# Patient Record
Sex: Female | Born: 1965
Health system: Southern US, Community
[De-identification: ages and names within clinical notes are randomized; demographics above are authoritative.]

## PROBLEM LIST (undated history)

## (undated) DIAGNOSIS — K5732 Diverticulitis of large intestine without perforation or abscess without bleeding: Secondary | ICD-10-CM

## (undated) DIAGNOSIS — M359 Systemic involvement of connective tissue, unspecified: Secondary | ICD-10-CM

## (undated) DIAGNOSIS — M1990 Unspecified osteoarthritis, unspecified site: Secondary | ICD-10-CM

## (undated) DIAGNOSIS — C801 Malignant (primary) neoplasm, unspecified: Secondary | ICD-10-CM

## (undated) DIAGNOSIS — K5720 Diverticulitis of large intestine with perforation and abscess without bleeding: Secondary | ICD-10-CM

## (undated) DIAGNOSIS — E785 Hyperlipidemia, unspecified: Secondary | ICD-10-CM

## (undated) DIAGNOSIS — I639 Cerebral infarction, unspecified: Secondary | ICD-10-CM

## (undated) DIAGNOSIS — M069 Rheumatoid arthritis, unspecified: Secondary | ICD-10-CM

## (undated) DIAGNOSIS — Z9889 Other specified postprocedural states: Secondary | ICD-10-CM

## (undated) DIAGNOSIS — E876 Hypokalemia: Secondary | ICD-10-CM

## (undated) DIAGNOSIS — I10 Essential (primary) hypertension: Secondary | ICD-10-CM

## (undated) DIAGNOSIS — F419 Anxiety disorder, unspecified: Secondary | ICD-10-CM

## (undated) DIAGNOSIS — T4145XA Adverse effect of unspecified anesthetic, initial encounter: Secondary | ICD-10-CM

## (undated) DIAGNOSIS — F329 Major depressive disorder, single episode, unspecified: Secondary | ICD-10-CM

## (undated) DIAGNOSIS — T884XXA Failed or difficult intubation, initial encounter: Secondary | ICD-10-CM

## (undated) DIAGNOSIS — E669 Obesity, unspecified: Secondary | ICD-10-CM

## (undated) DIAGNOSIS — T8859XA Other complications of anesthesia, initial encounter: Secondary | ICD-10-CM

## (undated) DIAGNOSIS — E0590 Thyrotoxicosis, unspecified without thyrotoxic crisis or storm: Secondary | ICD-10-CM

## (undated) DIAGNOSIS — F32A Depression, unspecified: Secondary | ICD-10-CM

## (undated) DIAGNOSIS — I509 Heart failure, unspecified: Secondary | ICD-10-CM

## (undated) DIAGNOSIS — K9189 Other postprocedural complications and disorders of digestive system: Secondary | ICD-10-CM

## (undated) DIAGNOSIS — Z87442 Personal history of urinary calculi: Secondary | ICD-10-CM

## (undated) HISTORY — DX: Failed or difficult intubation, initial encounter: T88.4XXA

## (undated) HISTORY — DX: Obesity, unspecified: E66.9

## (undated) HISTORY — DX: Hyperlipidemia, unspecified: E78.5

## (undated) HISTORY — PX: ABDOMINAL HYSTERECTOMY: SHX81

## (undated) HISTORY — PX: CARDIAC CATHETERIZATION: SHX172

## (undated) HISTORY — PX: TONSILLECTOMY: SUR1361

## (undated) HISTORY — PX: CHOLECYSTECTOMY: SHX55

## (undated) HISTORY — PX: TOTAL THYROIDECTOMY: SHX2547

## (undated) HISTORY — DX: Thyrotoxicosis, unspecified without thyrotoxic crisis or storm: E05.90

## (undated) HISTORY — DX: Diverticulitis of large intestine with perforation and abscess without bleeding: K57.20

## (undated) HISTORY — DX: Hypokalemia: E87.6

## (undated) HISTORY — DX: Other specified postprocedural states: Z98.890

## (undated) HISTORY — DX: Rheumatoid arthritis, unspecified: M06.9

## (undated) HISTORY — PX: CARPAL TUNNEL RELEASE: SHX101

## (undated) HISTORY — DX: Other postprocedural complications and disorders of digestive system: K91.89

---

## 1992-09-06 DIAGNOSIS — Z87442 Personal history of urinary calculi: Secondary | ICD-10-CM

## 1992-09-06 HISTORY — DX: Personal history of urinary calculi: Z87.442

## 2006-05-15 ENCOUNTER — Emergency Department: Payer: Self-pay | Admitting: Emergency Medicine

## 2006-05-20 ENCOUNTER — Ambulatory Visit: Payer: Self-pay | Admitting: Nurse Practitioner

## 2008-10-01 ENCOUNTER — Ambulatory Visit: Payer: Self-pay | Admitting: Orthopedic Surgery

## 2008-10-08 ENCOUNTER — Ambulatory Visit: Payer: Self-pay | Admitting: Orthopedic Surgery

## 2009-06-25 ENCOUNTER — Inpatient Hospital Stay: Payer: Self-pay | Admitting: Internal Medicine

## 2010-10-29 ENCOUNTER — Ambulatory Visit: Payer: Self-pay | Admitting: Anesthesiology

## 2010-11-03 ENCOUNTER — Ambulatory Visit: Payer: Self-pay | Admitting: Orthopedic Surgery

## 2011-09-07 DIAGNOSIS — I639 Cerebral infarction, unspecified: Secondary | ICD-10-CM

## 2011-09-07 HISTORY — DX: Cerebral infarction, unspecified: I63.9

## 2012-07-23 ENCOUNTER — Inpatient Hospital Stay (HOSPITAL_COMMUNITY): Payer: BC Managed Care – PPO

## 2012-07-23 ENCOUNTER — Encounter (HOSPITAL_COMMUNITY): Payer: Self-pay | Admitting: *Deleted

## 2012-07-23 ENCOUNTER — Emergency Department: Payer: Self-pay | Admitting: Unknown Physician Specialty

## 2012-07-23 ENCOUNTER — Inpatient Hospital Stay (HOSPITAL_COMMUNITY)
Admission: AD | Admit: 2012-07-23 | Discharge: 2012-07-26 | DRG: 014 | Disposition: A | Discharge status: Home / Self Care | Payer: BC Managed Care – PPO | Source: Other Acute Inpatient Hospital | Attending: Neurology | Admitting: Neurology

## 2012-07-23 ENCOUNTER — Inpatient Hospital Stay: Admission: AD | Admit: 2012-07-23 | Payer: Self-pay | Source: Other Acute Inpatient Hospital | Admitting: Neurology

## 2012-07-23 DIAGNOSIS — E785 Hyperlipidemia, unspecified: Secondary | ICD-10-CM | POA: Diagnosis present

## 2012-07-23 DIAGNOSIS — Z79899 Other long term (current) drug therapy: Secondary | ICD-10-CM

## 2012-07-23 DIAGNOSIS — R29898 Other symptoms and signs involving the musculoskeletal system: Secondary | ICD-10-CM | POA: Diagnosis present

## 2012-07-23 DIAGNOSIS — E669 Obesity, unspecified: Secondary | ICD-10-CM | POA: Diagnosis present

## 2012-07-23 DIAGNOSIS — Z6831 Body mass index (BMI) 31.0-31.9, adult: Secondary | ICD-10-CM

## 2012-07-23 DIAGNOSIS — Z7982 Long term (current) use of aspirin: Secondary | ICD-10-CM

## 2012-07-23 DIAGNOSIS — I639 Cerebral infarction, unspecified: Secondary | ICD-10-CM | POA: Diagnosis present

## 2012-07-23 DIAGNOSIS — Z9282 Status post administration of tPA (rtPA) in a different facility within the last 24 hours prior to admission to current facility: Secondary | ICD-10-CM

## 2012-07-23 DIAGNOSIS — Z23 Encounter for immunization: Secondary | ICD-10-CM

## 2012-07-23 DIAGNOSIS — M069 Rheumatoid arthritis, unspecified: Secondary | ICD-10-CM | POA: Diagnosis present

## 2012-07-23 DIAGNOSIS — I10 Essential (primary) hypertension: Secondary | ICD-10-CM | POA: Diagnosis present

## 2012-07-23 DIAGNOSIS — I6350 Cerebral infarction due to unspecified occlusion or stenosis of unspecified cerebral artery: Principal | ICD-10-CM

## 2012-07-23 DIAGNOSIS — F17200 Nicotine dependence, unspecified, uncomplicated: Secondary | ICD-10-CM | POA: Diagnosis present

## 2012-07-23 HISTORY — DX: Essential (primary) hypertension: I10

## 2012-07-23 HISTORY — DX: Cerebral infarction, unspecified: I63.9

## 2012-07-23 HISTORY — DX: Unspecified osteoarthritis, unspecified site: M19.90

## 2012-07-23 LAB — CK TOTAL AND CKMB (NOT AT ARMC): CK, Total: 89 U/L (ref 21–215)

## 2012-07-23 LAB — CBC
HGB: 14.6 g/dL (ref 12.0–16.0)
MCH: 29.8 pg (ref 26.0–34.0)
MCHC: 35.6 g/dL (ref 32.0–36.0)
RBC: 4.89 10*6/uL (ref 3.80–5.20)
RDW: 13.2 % (ref 11.5–14.5)
WBC: 7.1 10*3/uL (ref 3.6–11.0)

## 2012-07-23 LAB — TROPONIN I: Troponin-I: <0.02 ng/mL

## 2012-07-23 LAB — COMPREHENSIVE METABOLIC PANEL
Albumin: 4.1 g/dL (ref 3.4–5.0)
Anion Gap: 7 (ref 7–16)
BUN: 13 mg/dL (ref 7–18)
Bilirubin,Total: 0.6 mg/dL (ref 0.2–1.0)
Chloride: 106 mmol/L (ref 98–107)
Co2: 28 mmol/L (ref 21–32)
EGFR (African American): >60
Osmolality: 281 (ref 275–301)
Potassium: 3.3 mmol/L — ABNORMAL LOW (ref 3.5–5.1)
SGOT(AST): 26 U/L (ref 15–37)
Total Protein: 7.8 g/dL (ref 6.4–8.2)

## 2012-07-23 LAB — SEDIMENTATION RATE: Erythrocyte Sed Rate: 15 mm/h

## 2012-07-23 LAB — APTT: Activated PTT: 33.4 secs (ref 23.6–35.9)

## 2012-07-23 LAB — PROTIME-INR
INR: 0.9
Prothrombin Time: 12.6 s

## 2012-07-23 LAB — MAGNESIUM: Magnesium: 1.9 mg/dL

## 2012-07-23 LAB — MRSA PCR SCREENING: MRSA by PCR: NEGATIVE

## 2012-07-23 MED ORDER — ACETAMINOPHEN 325 MG PO TABS
650.0000 mg | ORAL_TABLET | ORAL | Status: DC | PRN
  Administered 2012-07-23 – 2012-07-24 (×4): 650 mg via ORAL
  Filled 2012-07-23 (×4): qty 2

## 2012-07-23 MED ORDER — ACETAMINOPHEN 650 MG RE SUPP: 650.0000 mg | RECTAL | Status: DC | PRN

## 2012-07-23 MED ORDER — LABETALOL HCL 5 MG/ML IV SOLN: 10.0000 mg | INTRAVENOUS | Status: DC | PRN

## 2012-07-23 MED ORDER — PANTOPRAZOLE SODIUM 40 MG IV SOLR
40.0000 mg | Freq: Every day | INTRAVENOUS | Status: DC
  Administered 2012-07-23: 40 mg via INTRAVENOUS
  Filled 2012-07-23 (×2): qty 40

## 2012-07-23 MED ORDER — ONDANSETRON HCL 4 MG/2ML IJ SOLN
4.0000 mg | Freq: Four times a day (QID) | INTRAMUSCULAR | Status: DC | PRN
  Administered 2012-07-23: 4 mg via INTRAVENOUS
  Filled 2012-07-23: qty 2

## 2012-07-23 MED ORDER — SENNOSIDES-DOCUSATE SODIUM 8.6-50 MG PO TABS
1.0000 | ORAL_TABLET | Freq: Every evening | ORAL | Status: DC | PRN
  Filled 2012-07-23: qty 1

## 2012-07-23 MED ORDER — PNEUMOCOCCAL VAC POLYVALENT 25 MCG/0.5ML IJ INJ
0.5000 mL | INJECTION | INTRAMUSCULAR | Status: AC
  Administered 2012-07-24: 0.5 mL via INTRAMUSCULAR
  Filled 2012-07-23: qty 0.5

## 2012-07-23 MED ORDER — SODIUM CHLORIDE 0.9 % IV SOLN
INTRAVENOUS | Status: DC
  Administered 2012-07-23: 14:00:00 via INTRAVENOUS
  Administered 2012-07-24: 75 mL/h via INTRAVENOUS

## 2012-07-23 NOTE — H&P (Signed)
Admission H&P    Chief Complaint: Acute onset of left lower extremity weakness and numbness.  HPI: Rebecca Obrien is an 46 y.o. female history rheumatoid arthritis and hypertension who developed weakness and numbness of her left lower extremity at 10:30 this morning. She went to the emergency room at [facility] for evaluation. She had no weakness of left upper extremity nor facial weakness. There no changes in speech. CT scan of her head showed no signs of acute stroke nor hemorrhage. There is no previous history of stroke nor TIA. Patient has not been on antiplatelet therapy. She was given intravenous TPA, full dose and subsequently transferred to [facility] for further management. NIH stroke score prior to TPA was 3. NIH stroke score on arrival at [facility] was 1.  LSN: 10:30 AM today tPA Given: Yes MRankin: 1  Past Medical History  Diagnosis Date  . Arthritis     RA  . Hypertension     Past Surgical History  Procedure Date  . Abdominal hysterectomy     No family history on file. Social History:  reports that she has been smoking Cigarettes.  She has a 23 pack-year smoking history. She does not have any smokeless tobacco history on file. She reports that she does not drink alcohol or use illicit drugs.  Allergies: Allergies not on file  Medications: Prior to admission: Remicade Prinzide 10-12.5 one per day.  ROS: 14 point review of systems was unremarkable except for presenting illness.  Physical Examination: Blood pressure 148/76, pulse 81, temperature 98 F (36.7 C), temperature source Oral, resp. rate 18, height 5\' 5"  (1.651 m), weight 83.6 kg (184 lb 4.9 oz), SpO2 99.00%.  HEENT-  Normocephalic, no lesions, without obvious abnormality.  Normal external eye and conjunctiva.  Normal TM's bilaterally.  Normal auditory canals and external ears. Normal external nose, mucus membranes and septum.  Normal pharynx. Neck supple with no  masses, nodes, nodules or enlargement. Cardiovascular - regular rate and rhythm, S1, S2 normal, no murmur, click, rub or gallop Lungs - chest clear, no wheezing, rales, normal symmetric air entry, Heart exam - S1, S2 normal, no murmur, no gallop, rate regular Abdomen - soft, non-tender; bowel sounds normal; no masses,  no organomegaly Extremities - no joint deformities, effusion, or inflammation, no edema and no skin discoloration  Neurologic Examination: Mental Status: Alert, oriented x3.  Speech fluent without evidence of aphasia. Able to follow commands without difficulty. Cranial Nerves: II-Visual fields were normal. III/IV/VI-Pupils were equal and reacted. Extraocular movements were full and conjugate.    V/VII-no facial numbness and no facial weakness. VIII-normal. X-normal speech and symmetrical palatal movement. XII-midline tongue extension Motor: Slight drift of left lower extremity against gravity; normal strength distally of left lower extremity; motor exam otherwise completely normal including left upper extremity. Sensory: Normal throughout. Deep Tendon Reflexes: 2+ and symmetric. Plantars: Mute bilaterally Cerebellar: Normal finger-to-nose testing. Carotid auscultation: Normal    No results found for this or any previous visit (from the past 48 hour(s)). No results found.  Assessment: 46 y.o. female presenting post TPA administration with history indicative of possible right anterior cerebral artery territory TIA or stroke.  Stroke Risk Factors - hypertension  Plan: 1. HgbA1c, fasting lipid panel 2. MRI, MRA  of the brain without contrast 3. PT consult 4. Echocardiogram 5. Carotid dopplers 6. Prophylactic therapy-Antiplatelet med: Aspirin 325 mg per day if it CT scan of the head shows no signs of intracranial hemorrhage 24 hours  post TPA administration. 7. Risk factor modification 7. Telemetry monitoring 8. Workup to rule out hypercoagulable state  C.R. Roseanne Reno,  MD [facility] [phone] 07/23/2012, 1:44 PM

## 2012-07-24 ENCOUNTER — Encounter (HOSPITAL_COMMUNITY): Payer: Self-pay | Admitting: *Deleted

## 2012-07-24 ENCOUNTER — Inpatient Hospital Stay (HOSPITAL_COMMUNITY): Payer: BC Managed Care – PPO

## 2012-07-24 DIAGNOSIS — I6350 Cerebral infarction due to unspecified occlusion or stenosis of unspecified cerebral artery: Secondary | ICD-10-CM

## 2012-07-24 LAB — LIPID PANEL
LDL Cholesterol: 132 mg/dL — ABNORMAL HIGH (ref 0–99)
Total CHOL/HDL Ratio: 5.6 RATIO
VLDL: 24 mg/dL (ref 0–40)

## 2012-07-24 MED ORDER — ASPIRIN 325 MG PO TABS
325.0000 mg | ORAL_TABLET | Freq: Every day | ORAL | Status: DC
  Administered 2012-07-24 – 2012-07-25 (×2): 325 mg via ORAL
  Filled 2012-07-24 (×3): qty 1

## 2012-07-24 MED ORDER — ATORVASTATIN CALCIUM 10 MG PO TABS
10.0000 mg | ORAL_TABLET | Freq: Every day | ORAL | Status: DC
  Administered 2012-07-24 – 2012-07-25 (×2): 10 mg via ORAL
  Filled 2012-07-24 (×3): qty 1

## 2012-07-24 MED ORDER — LISINOPRIL 10 MG PO TABS
10.0000 mg | ORAL_TABLET | Freq: Every day | ORAL | Status: DC
  Administered 2012-07-25: 10 mg via ORAL
  Filled 2012-07-24 (×2): qty 1

## 2012-07-24 MED ORDER — PANTOPRAZOLE SODIUM 40 MG PO TBEC
40.0000 mg | DELAYED_RELEASE_TABLET | Freq: Every day | ORAL | Status: DC
  Filled 2012-07-24: qty 1

## 2012-07-24 MED ORDER — LISINOPRIL-HYDROCHLOROTHIAZIDE 10-12.5 MG PO TABS: 1.0000 | ORAL_TABLET | Freq: Every day | ORAL | Status: DC

## 2012-07-24 MED ORDER — LORAZEPAM 2 MG/ML IJ SOLN
1.0000 mg | INTRAMUSCULAR | Status: AC
  Administered 2012-07-24: 1 mg via INTRAVENOUS
  Filled 2012-07-24: qty 1

## 2012-07-24 MED ORDER — HYDROCHLOROTHIAZIDE 12.5 MG PO CAPS
12.5000 mg | ORAL_CAPSULE | Freq: Every day | ORAL | Status: DC
  Administered 2012-07-25: 12.5 mg via ORAL
  Filled 2012-07-24 (×2): qty 1

## 2012-07-24 NOTE — Progress Notes (Signed)
Echocardiogram 2D Echocardiogram has been performed.  Chantay Whitelock 07/24/2012, 10:39 AM

## 2012-07-24 NOTE — Progress Notes (Addendum)
Pt tx from 3106 via w/c.  Pt placed in bed and safety plan discussed. Pt also placed on Tele # [id number]. Will cont to monitor. Deliah Goody 07/24/2012 2115

## 2012-07-24 NOTE — Progress Notes (Signed)
Utilization review completed.  P.J. Shigeru Lampert,RN,BSN Case Manager [phone]  

## 2012-07-24 NOTE — Progress Notes (Signed)
Stroke Team Progress Note  HISTORY Rebecca Obrien is an 46 y.o. female history rheumatoid arthritis and hypertension who developed weakness and numbness of her left lower extremity at 10:30 this morning. She went to the emergency room at [facility] for evaluation. She had no weakness of left upper extremity nor facial weakness. There no changes in speech. CT scan of her head showed no signs of acute stroke nor hemorrhage. There is no previous history of stroke nor TIA. Patient has not been on antiplatelet therapy. She was given intravenous TPA, full dose and subsequently transferred to [facility] for further management. NIH stroke score prior to TPA was 3. NIH stroke score on arrival at [facility] was 1. She was admitted to the neuro ICU for further evaluation and treatment.  SUBJECTIVE STABLE. Slight improvement since yesterday.  OBJECTIVE Most recent Vital Signs: Filed Vitals:   07/24/12 0500 07/24/12 0600 07/24/12 0700 07/24/12 0800  BP: 109/44 119/52 117/78 123/66  Pulse: 57 68 69 56  Temp:    97.7 F (36.5 C)  TempSrc:    Oral  Resp: 17 15 16 16   Height:      Weight:      SpO2: 95% 97% 98% 96%   CBG (last 3)  No results found for this basename: GLUCAP:3 in the last 72 hours  IV Fluid Intake:     . sodium chloride 75 mL/hr at 07/24/12 0800    MEDICATIONS    . pantoprazole (PROTONIX) IV  40 mg Intravenous QHS  . pneumococcal 23 valent vaccine  0.5 mL Intramuscular Tomorrow-1000   PRN:  acetaminophen, acetaminophen, labetalol, ondansetron (ZOFRAN) IV, senna-docusate  Diet:  Cardiac thin liquids Activity:  Bedrest DVT Prophylaxis:  SCDs   CLINICALLY SIGNIFICANT STUDIES Basic Metabolic Panel: No results found for this basename: NA:2,K:2,CL:2,CO2:2,GLUCOSE:2,BUN:2,CREATININE:2,CALCIUM:2,MG:2,PHOS:2 in the last 168 hours Liver Function Tests: No results found for this basename: AST:2,ALT:2,ALKPHOS:2,BILITOT:2,PROT:2,ALBUMIN:2 in the last  168 hours CBC: No results found for this basename: WBC:2,NEUTROABS:2,HGB:2,HCT:2,MCV:2,PLT:2 in the last 168 hours Coagulation: No results found for this basename: LABPROT:4,INR:4 in the last 168 hours Cardiac Enzymes: No results found for this basename: CKTOTAL:3,CKMB:3,CKMBINDEX:3,TROPONINI:3 in the last 168 hours Urinalysis: No results found for this basename: COLORURINE:2,APPERANCEUR:2,LABSPEC:2,PHURINE:2,GLUCOSEU:2,HGBUR:2,BILIRUBINUR:2,KETONESUR:2,PROTEINUR:2,UROBILINOGEN:2,NITRITE:2,LEUKOCYTESUR:2 in the last 168 hours Lipid Panel    Component Value Date/Time   CHOL 190 07/24/2012 0435   TRIG 122 07/24/2012 0435   HDL 34* 07/24/2012 0435   CHOLHDL 5.6 07/24/2012 0435   VLDL 24 07/24/2012 0435   LDLCALC 132* 07/24/2012 0435   HgbA1C  No results found for this basename: HGBA1C   Urine Drug Screen:   No results found for this basename: labopia, cocainscrnur, labbenz, amphetmu, thcu, labbarb    Alcohol Level: No results found for this basename: ETH:2 in the last 168 hours   CT of the brain  07/23/2012 at [place]  MRI of the brain    MRA of the brain    2D Echocardiogram    Carotid Doppler    CXR  07/24/2012  No acute abnormalities.   EKG  normal sinus rhythm at [place]  Therapy Recommendations PT - ; OT - ; ST -   Physical Exam   GENERAL EXAM: Patient is in no distress  CARDIOVASCULAR: Regular rate and rhythm, no murmurs, no carotid bruits  NEUROLOGIC: MENTAL STATUS: awake, alert, language fluent, comprehension intact, naming intact CRANIAL NERVE: pupils equal and reactive to light, visual fields full to confrontation, extraocular muscles intact, no nystagmus, facial sensation and strength symmetric,  uvula midline, shoulder shrug symmetric, tongue midline. MOTOR: normal bulk and tone, full strength in the BUE, RLE; LLE 4/5. SENSORY: normal and symmetric to light touch COORDINATION: finger-nose-finger, fine finger movements normal REFLEXES: deep tendon  reflexes present and symmetric GAIT/STATION: BED REST.  ASSESSMENT Ms. Keisha Amer is a 46 y.o. female presenting with weakness and numbness of the left lower extremity . Status post IV t-PA 07/23/2012 at 1155 at [facility], transferred to [facility] for management. Suspect right brain stroke. 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  Work up underway. On no antiplatelet prior to admission. Now on no antiplatelet as within 24h of tpa for secondary stroke prevention. Patient with resultant LLE hemiparesis.   Rheumatoid arthritis, on remicade Hypertension Hyperlipidemia, LDL 132, not on statin PTA, goal LDL < 100 Cigarette smoker Obesity class 1 Body mass index is 31.22 kg/(m^2).  Hospital day # 1  TREATMENT/PLAN  MRI, MRA at 24h. Ativan for sedation as pt claustrophobic   Add aspirin 325 mg orally every day for secondary stroke prevention if imaging at 24h negative for hemorrhage  If imaging stable, will get OOB, therapy evals  D/c foley at 24h  Add statin  Stop smoking  Resume home BP medication later today/tomorrow  Transfer to the floor if imaging stable  Annie Main, MSN, RN, ANVP-BC, [facility] Pager: [phone] 07/24/2012 8:49 AM  [facility] - Stroke Team Joycelyn Schmid, MD 07/24/2012, 7:19 PM   Please refer to [email or web address] for on-call Stroke MD

## 2012-07-24 NOTE — Progress Notes (Signed)
PT Cancellation Note  Patient Details Name: Gigi Onstad MRN: [id number] DOB: [birth year]   Cancelled Treatment:    Reason Eval/Treat Not Completed: Patient not medically ready (Pt currently on strict bedrest.)  Please update activity orders when appropriate.  Thanks!!   Zalayah Pizzuto 07/24/2012, 8:01 AM Jake Shark, PT DPT [phone]

## 2012-07-25 LAB — SEDIMENTATION RATE: Sed Rate: 18 mm/hr (ref 0–22)

## 2012-07-25 MED ORDER — SODIUM CHLORIDE 0.9 % IV SOLN
INTRAVENOUS | Status: DC
  Administered 2012-07-26 (×2): via INTRAVENOUS

## 2012-07-25 NOTE — Progress Notes (Signed)
Stroke Team Progress Note  HISTORY Rebecca Obrien is an 46 y.o. female history rheumatoid arthritis and hypertension who developed weakness and numbness of her left lower extremity at 10:30 this morning. She went to the emergency room at [facility] for evaluation. She had no weakness of left upper extremity nor facial weakness. There no changes in speech. CT scan of her head showed no signs of acute stroke nor hemorrhage. There is no previous history of stroke nor TIA. Patient has not been on antiplatelet therapy. She was given intravenous TPA, full dose and subsequently transferred to [facility] for further management. NIH stroke score prior to TPA was 3. NIH stroke score on arrival at  Hospital was 1. She was admitted to the neuro ICU for further evaluation and treatment.  SUBJECTIVE No complaints. LLE still slightly weak.  OBJECTIVE Most recent Vital Signs: Filed Vitals:   07/25/12 0110 07/25/12 0310 07/25/12 0510 07/25/12 0900  BP: 116/62 116/70 126/66 135/84  Pulse: 65 70 73 66  Temp: 97.8 F (36.6 C) 97.9 F (36.6 C) 97.8 F (36.6 C) 98.2 F (36.8 C)  TempSrc:    Oral  Resp: 18 18 18 18  Height:      Weight:      SpO2: 96% 97% 96% 96%   IV Fluid Intake:      . [DISCONTINUED] sodium chloride 75 mL/hr at 07/24/12 0800    MEDICATIONS     . aspirin  325 mg Oral Daily  . atorvastatin  10 mg Oral q1800  . lisinopril  10 mg Oral Daily   And  . hydrochlorothiazide  12.5 mg Oral Daily  . [COMPLETED] LORazepam  1 mg Intravenous On Call  . [DISCONTINUED] lisinopril-hydrochlorothiazide  1 tablet Oral Daily  . [DISCONTINUED] pantoprazole  40 mg Oral QHS  . [DISCONTINUED] pantoprazole (PROTONIX) IV  40 mg Intravenous QHS   PRN:  acetaminophen, acetaminophen, labetalol, senna-docusate, [DISCONTINUED] ondansetron (ZOFRAN) IV  Diet:  Cardiac thin liquids Activity:  Bedrest DVT Prophylaxis:  SCDs   CLINICALLY SIGNIFICANT STUDIES Basic  Metabolic Panel: No results found for this basename: NA:2,K:2,CL:2,CO2:2,GLUCOSE:2,BUN:2,CREATININE:2,CALCIUM:2,MG:2,PHOS:2 in the last 168 hours Liver Function Tests: No results found for this basename: AST:2,ALT:2,ALKPHOS:2,BILITOT:2,PROT:2,ALBUMIN:2 in the last 168 hours CBC: No results found for this basename: WBC:2,NEUTROABS:2,HGB:2,HCT:2,MCV:2,PLT:2 in the last 168 hours Coagulation: No results found for this basename: LABPROT:4,INR:4 in the last 168 hours Cardiac Enzymes: No results found for this basename: CKTOTAL:3,CKMB:3,CKMBINDEX:3,TROPONINI:3 in the last 168 hours Urinalysis: No results found for this basename: COLORURINE:2,APPERANCEUR:2,LABSPEC:2,PHURINE:2,GLUCOSEU:2,HGBUR:2,BILIRUBINUR:2,KETONESUR:2,PROTEINUR:2,UROBILINOGEN:2,NITRITE:2,LEUKOCYTESUR:2 in the last 168 hours Lipid Panel    Component Value Date/Time   CHOL 190 07/24/2012 0435   TRIG 122 07/24/2012 0435   HDL 34* 07/24/2012 0435   CHOLHDL 5.6 07/24/2012 0435   VLDL 24 07/24/2012 0435   LDLCALC 132* 07/24/2012 0435   HgbA1C  Lab Results  Component Value Date   HGBA1C 5.6 07/24/2012   Urine Drug Screen:   No results found for this basename: labopia,  cocainscrnur,  labbenz,  amphetmu,  thcu,  labbarb    Alcohol Level: No results found for this basename: ETH:2 in the last 168 hours   CT of the brain  07/23/2012 at [place]  MRI of the brain  IMPRESSION:  Sub-centimeter acute infarction affecting the surface of a medial  right frontoparietal junction region gyrus. Otherwise normal  examination.   MRA of the brain  IMPRESSION:  No major vessel occlusion or correctable proximal stenosis.  Suspicion of distal vessel atherosclerotic change. However,   there  is a good bit of motion degradation on the MR angiography portion  of the exam. The lack of small vessel ischemic changes in the  brain on the parenchymal imaging raises the question if this is a  true finding or simply related to motion  degradation.   2D Echocardiogram  EF 55-60% with no source of embolus.  Carotid Doppler  No evidence of hemodynamically significant internal carotid artery stenosis. Vertebral artery flow is antegrade.   CXR  07/24/2012  No acute abnormalities.   EKG  normal sinus rhythm at [place]  Therapy Recommendations PT - outpatient; OT - ; ST - none  Physical Exam   GENERAL EXAM: Patient is in no distress  CARDIOVASCULAR: Regular rate and rhythm, no murmurs, no carotid bruits  NEUROLOGIC: MENTAL STATUS: awake, alert, language fluent, comprehension intact, naming intact CRANIAL NERVE: pupils equal and reactive to light, visual fields full to confrontation, extraocular muscles intact, no nystagmus, facial sensation and strength symmetric, uvula midline, shoulder shrug symmetric, tongue midline. MOTOR: normal bulk and tone, full strength in the BUE, RLE; LLE 4/5. SENSORY: normal and symmetric to light touch COORDINATION: finger-nose-finger, fine finger movements normal REFLEXES: deep tendon reflexes present and symmetric GAIT/STATION: BED REST.  ASSESSMENT Ms. Rebecca Obrien is a 46 y.o. female presenting with weakness and numbness of the left lower extremity . Status post IV t-PA 07/23/2012 at 1155 at [place], transferred to [facility] for management. 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  Work up underway. On no antiplatelet prior to admission. Now on aspirin 325 mg orally every day for secondary stroke prevention. Patient with resultant LLE hemiparesis that is improving   Rheumatoid arthritis, on remicade Hypertension Hyperlipidemia, LDL 132, not on statin PTA, goal LDL < 100 Cigarette smoker Obesity class 1 Body mass index is 31.22 kg/(m^2).  Hospital day # 2  TREATMENT/PLAN  Continue aspirin 325 mg orally every day for secondary stroke prevention   Stop smoking TEE to look for embolic  source. Arranged with [facility] for Wednesday. Will need to be NPO after midnight. If positive for PFO (patent foramen ovale), check bilateral lower extremity venous dopplers to rule out DVT as possible source of stroke.  Labs to look for hypercoagulable source:  Hypercoagulable panel (except Factor V Leiden & Beta-2-glycoprotein, which are both associated with venous not arterial infarcts), Vasculitic labs (C3, C4, CH50, ESR, ANA) HIV & RPR  SHARON BIBY, MSN, RN, ANVP-BC, ANP-BC, GNP-BC [facility] Pager: [phone] 07/25/2012 10:54 AM  [facility] - Stroke Team Newman Waren, MD 07/25/2012, 10:54 AM   Please refer to [email or web address] for on-call Stroke MD   

## 2012-07-25 NOTE — Evaluation (Signed)
Speech Language Pathology Evaluation Patient Details Name: Rebecca Obrien MRN: [id number] DOB: [birth year] Today's Date: 07/25/2012 Time: 4098-1191 SLP Time Calculation (min): 12 min  Problem List:  Patient Active Problem List  Diagnosis  . CVA (cerebral infarction)  . Hypertension   Past Medical History:  Past Medical History  Diagnosis Date  . Arthritis     RA  . Hypertension    Past Surgical History:  Past Surgical History  Procedure Date  . Abdominal hysterectomy    HPI:  Rebecca Obrien is a 46 y.o. female history rheumatoid arthritis and hypertension who developed weakness and numbness of her left lower extremity. CT scan of her head showed no signs of acute stroke nor hemorrhage. There is no previous history of stroke nor TIA. MRI revealed sub-centimeter acute infarction affecting the surface of a medial right frontoparietal junction region gyrus.   Assessment / Plan / Recommendation Clinical Impression  Cognitive-linguistic evaluation completed and revealed [facility] cognitive-linguistic skills and reports being at baseline level of function.  Therefore no follow up skilled SLP serives are warranted at this time.      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[employer], political [employer])   Cognition  Overall Cognitive Status: Appears within functional limits for tasks assessed Orientation Level: Oriented X4 Safety/Judgment: Appears intact    Comprehension  Auditory Comprehension Overall Auditory Comprehension: Appears within functional limits for tasks assessed Visual Recognition/Discrimination Discrimination: Within Function Limits Reading Comprehension Reading Status: Within funtional limits    Expression Expression Primary Mode of  Expression: Verbal Verbal Expression Overall Verbal Expression: Appears within functional limits for tasks assessed Written Expression Written Expression: Not tested   Oral / Motor Oral Motor/Sensory Function Overall Oral Motor/Sensory Function: Appears within functional limits for tasks assessed Motor Speech Overall Motor Speech: Appears within functional limits for tasks assessed    Charlane Ferretti., CCC-SLP [phone]  Rebecca Obrien 07/25/2012, 12:18 PM

## 2012-07-25 NOTE — Evaluation (Signed)
Physical Therapy Evaluation Patient Details Name: Rebecca Obrien MRN: [id number] DOB: [birth year] Today's Date: 07/25/2012 Time: 1550-1620 PT Time Calculation (min): 30 min  PT Assessment / Plan / Recommendation Clinical Impression  Rebecca Obrien is a 46 y.o. female history rheumatoid arthritis and hypertension who developed weakness and numbness of her left lower extremity. CT scan of her head showed no signs of acute stroke nor hemorrhage so full TPA administered at [facility]. There is no previous history of stroke nor TIA. MRI revealed sub-centimeter acute infarction affecting the surface of a medial right frontoparietal junction region gyrus.  Presents to PT today with LLE weakness affecting her independence with gait especially with higher challenging activities affecting her prior level of function and ability to resume daily activities and return to work.    PT Assessment  Patient needs continued PT services    Follow Up Recommendations  Outpatient PT    Does the patient have the potential to tolerate intense rehabilitation      Barriers to Discharge Decreased caregiver support      Equipment Recommendations  None recommended by PT    Recommendations for Other Services     Frequency Min 4X/week    Precautions / Restrictions Precautions Precautions: None Restrictions Weight Bearing Restrictions: No   Pertinent Vitals/Pain Denies pain      Mobility  Bed Mobility Bed Mobility: Supine to Sit;Sit to Supine Supine to Sit: 6: Modified independent (Device/Increase time);HOB elevated (30 degrees) Sit to Supine: 6: Modified independent (Device/Increase time);HOB elevated (30 degrees) Transfers Transfers: Sit to Stand;Stand to Sit Sit to Stand: 7: Independent;5: Supervision Stand to Sit: 7: Independent;5: Supervision Details for Transfer Assistance: initial supervision with verbal cues for centered 50/50 weight bearing (pt tending to favor LLE), pt able to perform sit<>stand  x5 with good control and midline posture after education Ambulation/Gait Ambulation/Gait Assistance: 4: Min guard;5: Supervision Ambulation Distance (Feet): 600 Feet (rest after 300 ft) Assistive device: None Ambulation/Gait Assistance Details: verbal cues for midline posture  and equal weight bearing for improved fluidity of gait as pt tends to favor LLE with antalgic type gait Gait Pattern: Decreased weight shift to left;Decreased stance time - left General Gait Details: as pt fatigues her LLE tends to drag slightly  Stairs: No Modified Rankin (Stroke Patients Only) Pre-Morbid Rankin Score: No symptoms Modified Rankin: Slight disability    Shoulder Instructions     Exercises Other Exercises Other Exercises: single limb stance x5 bilaterally with 10 second holds, supervision for safety Other Exercises: AP weight shift with focus on weight bearing through LLE as RLE stepped forward and backward x10, no upper extremity support but supervision for safety   PT Diagnosis: Abnormality of gait;Difficulty walking;Generalized weakness;Hemiplegia non-dominant side  PT Problem List: Decreased strength;Decreased activity tolerance;Decreased balance PT Treatment Interventions: Gait training;Stair training;Functional mobility training;Therapeutic activities;Therapeutic exercise;Balance training;Neuromuscular re-education;Patient/family education   PT Goals Acute Rehab PT Goals PT Goal Formulation: With patient Time For Goal Achievement: 08/01/12 Potential to Achieve Goals: Good Pt will Ambulate: Independently;with gait velocity >(comment) ft/second (2.62 ft/sec (community ambulator)) PT Goal: Ambulate - Progress: Goal set today Pt will Go Up / Down Stairs: 3-5 stairs;Independently PT Goal: Up/Down Stairs - Progress: Goal set today Pt will Perform Home Exercise Program: Independently PT Goal: Perform Home Exercise Program - Progress: Goal set today  Visit Information  Last PT Received On:  07/25/12 Assistance Needed: +1    Subjective Data  Subjective: My leg just feels heavier.  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, broadcasting/film/video and bus driver Communication Communication: No difficulties    Cognition  Overall Cognitive Status: Appears within functional limits for tasks assessed/performed Arousal/Alertness: Awake/alert Orientation Level: Appears intact for tasks assessed Behavior During Session: [facility] for tasks performed    Extremity/Trunk Assessment Right Upper Extremity Assessment RUE ROM/Strength/Tone: [facility] for tasks assessed Left Upper Extremity Assessment LUE ROM/Strength/Tone: WFL for tasks assessed Right Lower Extremity Assessment RLE ROM/Strength/Tone: [facility] for tasks assessed Left Lower Extremity Assessment LLE ROM/Strength/Tone: Deficits LLE ROM/Strength/Tone Deficits: grossly 4+/5 throughout with slightly weaker knee flexion noted during MMT LLE Sensation: WFL - Light Touch LLE Coordination: Deficits LLE Coordination Deficits: very slight decrease in control with extremity unweighted (gait and heel shin test slightly off); pt with c/o leg trying to pull   Balance Balance Balance Assessed: Yes Static Standing Balance Static Standing - Balance Support: No upper extremity supported Static Standing - Level of Assistance: 7: Independent Static Standing - Comment/# of Minutes: pt stood with eyes closed and no increase in sway Single Leg Stance - Right Leg: 10  Single Leg Stance - Left Leg: 10  High Level Balance High Level Balance Comments:  Difficulty with speed changes   End of Session PT - End of Session Equipment Utilized During Treatment: Gait belt Activity Tolerance: Patient tolerated treatment well Patient left: in bed;with call bell/phone within reach;with family/visitor present Nurse Communication: Mobility status  GP     [facility] HELEN 07/25/2012, 4:47 PM

## 2012-07-25 NOTE — Progress Notes (Signed)
VASCULAR LAB PRELIMINARY  PRELIMINARY  PRELIMINARY  PRELIMINARY  Carotid duplex completed.    Preliminary report:  Bilateral:  No evidence of hemodynamically significant internal carotid artery stenosis.   Vertebral artery flow is antegrade.     Rebecca Obrien, RVS 07/25/2012, 2:22 PM

## 2012-07-25 NOTE — Progress Notes (Signed)
OT Cancellation Note  Patient Details Name: Rebecca Obrien MRN: [id number] DOB: 07-05-1966   Cancelled Treatment:     Pt gone for EEG. Will recheck on pt next day. Thanks,  Einar Crow D 07/25/2012, 2:16 PM

## 2012-07-26 ENCOUNTER — Encounter (HOSPITAL_COMMUNITY): Payer: Self-pay | Admitting: *Deleted

## 2012-07-26 ENCOUNTER — Encounter (HOSPITAL_COMMUNITY)
Admission: AD | Disposition: A | Discharge status: Home / Self Care | Payer: Self-pay | Source: Other Acute Inpatient Hospital | Attending: Neurology

## 2012-07-26 DIAGNOSIS — I6789 Other cerebrovascular disease: Secondary | ICD-10-CM

## 2012-07-26 HISTORY — PX: TEE WITHOUT CARDIOVERSION: SHX5443

## 2012-07-26 LAB — C4 COMPLEMENT: Complement C4, Body Fluid: 19 mg/dL (ref 10–40)

## 2012-07-26 LAB — LUPUS ANTICOAGULANT PANEL: PTT Lupus Anticoagulant: 36.2 secs (ref 28.0–43.0)

## 2012-07-26 LAB — RPR: RPR Ser Ql: NONREACTIVE

## 2012-07-26 LAB — HOMOCYSTEINE: Homocysteine: 11.2 umol/L (ref 4.0–15.4)

## 2012-07-26 LAB — CARDIOLIPIN ANTIBODIES, IGG, IGM, IGA
Anticardiolipin IgG: 4 GPL U/mL — ABNORMAL LOW (ref <23)
Anticardiolipin IgM: 2 MPL U/mL — ABNORMAL LOW (ref <11)

## 2012-07-26 LAB — PROTEIN C ACTIVITY: Protein C Activity: 194 % — ABNORMAL HIGH (ref 75–133)

## 2012-07-26 LAB — PROTEIN S ACTIVITY: Protein S Activity: 114 % (ref 69–129)

## 2012-07-26 LAB — ANA: Anti Nuclear Antibody(ANA): NEGATIVE

## 2012-07-26 SURGERY — ECHOCARDIOGRAM, TRANSESOPHAGEAL
Anesthesia: Moderate Sedation

## 2012-07-26 MED ORDER — BUTAMBEN-TETRACAINE-BENZOCAINE 2-2-14 % EX AERO
INHALATION_SPRAY | CUTANEOUS | Status: DC | PRN
  Administered 2012-07-26: 2 via TOPICAL

## 2012-07-26 MED ORDER — ATORVASTATIN CALCIUM 10 MG PO TABS: 10.0000 mg | ORAL_TABLET | Freq: Every day | ORAL | Status: DC

## 2012-07-26 MED ORDER — ASPIRIN 325 MG PO TABS: 325.0000 mg | ORAL_TABLET | Freq: Every day | ORAL | Status: DC

## 2012-07-26 MED ORDER — FENTANYL CITRATE 0.05 MG/ML IJ SOLN
INTRAMUSCULAR | Status: DC | PRN
  Administered 2012-07-26 (×4): 25 ug via INTRAVENOUS

## 2012-07-26 MED ORDER — FENTANYL CITRATE 0.05 MG/ML IJ SOLN
INTRAMUSCULAR | Status: AC
  Filled 2012-07-26: qty 2

## 2012-07-26 MED ORDER — MIDAZOLAM HCL 5 MG/ML IJ SOLN
INTRAMUSCULAR | Status: AC
  Filled 2012-07-26: qty 2

## 2012-07-26 MED ORDER — MIDAZOLAM HCL 10 MG/2ML IJ SOLN
INTRAMUSCULAR | Status: DC | PRN
  Administered 2012-07-26 (×5): 2 mg via INTRAVENOUS

## 2012-07-26 NOTE — Progress Notes (Signed)
PT Progress Note    07/26/12 1114  PT Visit Information  Last PT Received On 07/26/12  Assistance Needed +1  PT Time Calculation  PT Start Time 0949  PT Stop Time 1002  PT Time Calculation (min) 13 min  Subjective Data  Subjective "I feel fine"  Patient Stated Goal home  Precautions  Precautions None  Restrictions  Weight Bearing Restrictions No  Cognition  Overall Cognitive Status Appears within functional limits for tasks assessed/performed  Arousal/Alertness Awake/alert  Orientation Level Appears intact for tasks assessed  Behavior During Session [facility] for tasks performed  Bed Mobility  Bed Mobility Not assessed  Details for Bed Mobility Assistance Pt. presentd sitting at EOB.  Transfers  Transfers Sit to Stand;Stand to Sit  Sit to Stand 7: Independent;From bed;With upper extremity assist  Stand to Sit 7: Independent;With upper extremity assist;To chair/3-in-1;With armrests  Details for Transfer Assistance Pt. presents to be Independent with transfers throughout session. Pt. demonstrated appropriate technique with sit<>stand  Ambulation/Gait  Ambulation/Gait Assistance 7: Independent  Ambulation Distance (Feet) 500 Feet (500+)  Assistive device None  Ambulation/Gait Assistance Details Pt. ambulated in hallway with head turns, stop/gos, directional changes, speed changes, backwards walking, and turned 360deg and ambulated around unit 4north. Pt. ale to ambulate without UE support with no LOB, pt. states that she feels she is at baseline.  Pt. independent with ambulation at this time as she has been seen in hallway ambulating on her own.  Gait Pattern Step-through pattern  General Gait Details Pt. stated that her Lt. LE began to feel heavy as she had been walking so much this morning and wished to sit down. No dragging of Lt LE observed as documented in previous sessions.  Stairs Yes  Stairs Assistance 7: Independent  Stairs Assistance Details (indicate cue type and reason) Pt.  given no physical (A) for executing the stairs and ascended and descended three steps using one rail on Rt/on Lt with no presented difficulty.  Stair Management Technique One rail Right  Number of Stairs 3   Wheelchair Mobility  Wheelchair Mobility No  Modified Rankin (Stroke Patients Only)  Pre-Morbid Rankin Score 0  Modified Rankin 1  PT - End of Session  Equipment Utilized During Treatment Gait belt  Activity Tolerance Patient tolerated treatment well  Patient left in chair;with call bell/phone within reach  Nurse Communication Mobility status;Other (comment) (IV leakage)  PT - Assessment/Plan  Comments on Treatment Session Pt. presents to be moving well when OOB with ambulation, gt. challenges, and stairs. Pt is independent at this time with mobility as she has been observed walking in hallway on her own. Pt. states that she feels as though she is back at baseline and does not feel as though she will need outpatient PT once she is discharged. Will discuss pt's progress with PT and be recommending to sign off from PT services at this time.   PT Plan Discharge plan needs to be updated  PT Frequency Min 4X/week  Follow Up Recommendations No PT follow up  Equipment Recommended None recommended by OT  Acute Rehab PT Goals  PT Goal Formulation With patient  Time For Goal Achievement 08/01/12  Potential to Achieve Goals Good  Pt will Ambulate Independently;with gait velocity >(comment) ft/second  PT Goal: Ambulate - Progress Met  Pt will Go Up / Down Stairs 3-5 stairs;Independently  PT Goal: Up/Down Stairs - Progress Met  Pt will Perform Home Exercise Program Independently  PT Goal: Perform Home Exercise Program -  Progress Progressing toward goal   Patient had no c/o pain during session.  Mertie Clause, SPTA

## 2012-07-26 NOTE — Interval H&P Note (Signed)
History and Physical Interval Note:  07/26/2012 4:26 PM  Rebecca Obrien  has presented today for surgery, with the diagnosis of stroke  The various methods of treatment have been discussed with the patient and family. After consideration of risks, benefits and other options for treatment, the patient has consented to  Procedure(s) (LRB) with comments: TRANSESOPHAGEAL ECHOCARDIOGRAM (TEE) (N/A) as a surgical intervention .  The patient's history has been reviewed, patient examined, no change in status, stable for surgery.  I have reviewed the patient's chart and labs.  Questions were answered to the patient's satisfaction.     Elyn Aquas.

## 2012-07-26 NOTE — Discharge Summary (Signed)
Stroke Discharge Summary  Patient ID: Rebecca Obrien   MRN: [id number]      DOB: Nov 25, 1965  Date of Admission: 07/23/2012 Date of Discharge: 07/26/2012  Attending Physician:  Darcella Cheshire, MD, Stroke MD  Consulting Physician(s):     None  Patient's PCP:  No primary provider on file.  Discharge Diagnoses:  Active Problems:  CVA (cerebral infarction)  Hypertension  BMI: Body mass index is 31.22 kg/(m^2).  Past Medical History  Diagnosis Date  . Arthritis     RA  . Hypertension   . Stroke    Past Surgical History  Procedure Date  . Abdominal hysterectomy   . Cholecystectomy   . Carpal tunnel release Bilateral      Medication List     As of 07/26/2012  5:54 PM    TAKE these medications         aspirin 325 MG tablet   Take 1 tablet (325 mg total) by mouth daily.      atorvastatin 10 MG tablet   Commonly known as: LIPITOR   Take 1 tablet (10 mg total) by mouth daily at 6 PM.      lisinopril-hydrochlorothiazide 10-12.5 MG per tablet   Commonly known as: PRINZIDE,ZESTORETIC   Take 1 tablet by mouth daily.      REMICADE IV   Inject 400 mLs into the vein every 5 (five) weeks.        LABORATORY STUDIES  [place]/OTHER--on admission, Dr. Roseanne Reno  Lipid Panel    Component Value Date/Time   CHOL 190 07/24/2012 0435   TRIG 122 07/24/2012 0435   HDL 34* 07/24/2012 0435   CHOLHDL 5.6 07/24/2012 0435   VLDL 24 07/24/2012 0435   LDLCALC 132* 07/24/2012 0435   HgbA1C  Lab Results  Component Value Date   HGBA1C 5.6 07/24/2012   SIGNIFICANT DIAGNOSTIC STUDIES  CT of the brain 07/23/2012 at [place]  MRI of the brain IMPRESSION: Sub-centimeter acute infarction affecting the surface of a medial right frontoparietal junction region gyrus. Otherwise normal examination.  MRA of the brain IMPRESSION: No major vessel occlusion or correctable proximal stenosis.  Suspicion of distal vessel atherosclerotic change. However, there is a good bit of motion  degradation on the MR angiography portion of the exam. The lack of small vessel ischemic changes in the brain on the parenchymal imaging raises the question if this is a true finding or simply related to motion degradation.  2D Echocardiogram EF 55-60% with no source of embolus.  Carotid Doppler No evidence of hemodynamically significant internal carotid artery stenosis. Vertebral artery flow is antegrade.  CXR 07/24/2012 No acute abnormalities.  EKG normal sinus rhythm at [place] TEE: no cardiac source of embolus    History of Present Illness    Rebecca Obrien is an 46 y.o. female history rheumatoid arthritis and hypertension who developed weakness and numbness of her left lower extremity at 10:30 this morning. She went to the emergency room at [facility] for evaluation. She had no weakness of left upper extremity nor facial weakness. There no changes in speech. CT scan of her head showed no signs of acute stroke nor hemorrhage. There is no previous history of stroke nor TIA. Patient has not been on antiplatelet therapy. She was given intravenous TPA, full dose and subsequently transferred to [facility] for further management. NIH stroke score prior to TPA was 3. NIH stroke score on arrival at [facility] was 1.   LSN:  10:30 AM 07/23/2012 tPA Given: Yes  MRankin: 1  Hospital Course   Patient transferred for post tpa management. Infarct etiology suspected embolic (HTN, hyperlipidemia, remicade + cigarette smoking). On no antiplatelet prior to admission. Now on aspirin 325 mg orally every day for secondary stroke prevention. Patient has hypercoagulable lab panels drawn and are pending. She will have outpatient TCD/emboli and telemetry monitoring arranged. TEE on date of discharged showed no PFO. Plans are for discharge.   Patient has stroke risk factors of hypertension, hyperlipidemia, tobacco abuse.  Patient with continued stroke symptoms of left lower  extremity weakness. Physical therapy, occupational therapy and speech therapy evaluated patient. They recommend outpatient therapy.  Discharge Exam  Blood pressure 120/106, pulse 66, temperature 98 F (36.7 C), temperature source Oral, resp. rate 17, height 5\' 5"  (1.651 m), weight 85.1 kg (187 lb 9.8 oz), SpO2 95.00%.   GENERAL EXAM:  Patient is in no distress  CARDIOVASCULAR:  Regular rate and rhythm, no murmurs, no carotid bruits  NEUROLOGIC:  MENTAL STATUS: awake, alert, language fluent, comprehension intact, naming intact  CRANIAL NERVE: pupils equal and reactive to light, visual fields full to confrontation, extraocular muscles intact, no nystagmus, facial sensation and strength symmetric, uvula midline, shoulder shrug symmetric, tongue midline.  MOTOR: normal bulk and tone, full strength in the BUE, RLE; LLE 4/5.  SENSORY: normal and symmetric to light touch  COORDINATION: finger-nose-finger, fine finger movements normal  REFLEXES: deep tendon reflexes present and symmetric  GAIT/STATION: decreased LLE flexion  Discharge Diet    Heart thin liquids  Discharge Plan    Disposition:  home   Aspirin 325mg  daily for secondary stroke prevention.  Ongoing risk factor control by Primary Care Physician.  Risk factor recommendations:  {Hypertension target range 130-140/70-80  Lipid range - LDL < 100 and checked every 6 months, fasting  Diabetes - HgB A1C <7  Smoking cessation counselor  Follow-up with primary provider in 1 month (also to discuss options to remicade).  Follow-up with Dr. Delia Heady in 1 month (arrange TCD/outpatient telemetry).  Signed  Guy Franco, PAC,  MBA, MHA [facility] Pager: [phone] 07/26/2012 6:07 PM  Scribe for Dr. Delia Heady, Stroke Center Medical Director. He has personally reviewed chart, pertinent data, examined the patient and developed the plan of care. Pager:  [phone]

## 2012-07-26 NOTE — Progress Notes (Signed)
Stroke Team Progress Note  HISTORY Rebecca Obrien is an 46 y.o. female history rheumatoid arthritis and hypertension who developed weakness and numbness of her left lower extremity at 10:30 this morning. She went to the emergency room at [facility] for evaluation. She had no weakness of left upper extremity nor facial weakness. There no changes in speech. CT scan of her head showed no signs of acute stroke nor hemorrhage. There is no previous history of stroke nor TIA. Patient has not been on antiplatelet therapy. She was given intravenous TPA, full dose and subsequently transferred to [facility] for further management. NIH stroke score prior to TPA was 3. NIH stroke score on arrival at [facility] was 1. She was admitted to the neuro ICU for further evaluation and treatment.  SUBJECTIVE No complaints. Feels like deficits have resolved. She is for TEE today.  OBJECTIVE Most recent Vital Signs: Filed Vitals:   07/25/12 1529 07/25/12 2240 07/26/12 0213 07/26/12 0630  BP: 131/64 117/65 115/64 136/77  Pulse: 64 68 70 63  Temp: 98.1 F (36.7 C) 98.1 F (36.7 C) 98 F (36.7 C) 97.9 F (36.6 C)  TempSrc: Oral Oral Oral Oral  Resp: 18 18 16 18   Height:      Weight:      SpO2: 98% 98% 97% 98%   IV Fluid Intake:      . sodium chloride 20 mL/hr at 07/26/12 0630    MEDICATIONS     . aspirin  325 mg Oral Daily  . atorvastatin  10 mg Oral q1800  . lisinopril  10 mg Oral Daily   And  . hydrochlorothiazide  12.5 mg Oral Daily   PRN:  acetaminophen, acetaminophen, labetalol, senna-docusate  Diet:  NPO thin liquids Activity:  Bedrest DVT Prophylaxis:  SCDs   CLINICALLY SIGNIFICANT STUDIES Basic Metabolic Panel: No results found for this basename: NA:2,K:2,CL:2,CO2:2,GLUCOSE:2,BUN:2,CREATININE:2,CALCIUM:2,MG:2,PHOS:2 in the last 168 hours Liver Function Tests: No results found for this basename: AST:2,ALT:2,ALKPHOS:2,BILITOT:2,PROT:2,ALBUMIN:2 in the  last 168 hours CBC: No results found for this basename: WBC:2,NEUTROABS:2,HGB:2,HCT:2,MCV:2,PLT:2 in the last 168 hours Coagulation: No results found for this basename: LABPROT:4,INR:4 in the last 168 hours Cardiac Enzymes: No results found for this basename: CKTOTAL:3,CKMB:3,CKMBINDEX:3,TROPONINI:3 in the last 168 hours Urinalysis: No results found for this basename: COLORURINE:2,APPERANCEUR:2,LABSPEC:2,PHURINE:2,GLUCOSEU:2,HGBUR:2,BILIRUBINUR:2,KETONESUR:2,PROTEINUR:2,UROBILINOGEN:2,NITRITE:2,LEUKOCYTESUR:2 in the last 168 hours Lipid Panel    Component Value Date/Time   CHOL 190 07/24/2012 0435   TRIG 122 07/24/2012 0435   HDL 34* 07/24/2012 0435   CHOLHDL 5.6 07/24/2012 0435   VLDL 24 07/24/2012 0435   LDLCALC 132* 07/24/2012 0435   HgbA1C  Lab Results  Component Value Date   HGBA1C 5.6 07/24/2012   Urine Drug Screen:   No results found for this basename: labopia,  cocainscrnur,  labbenz,  amphetmu,  thcu,  labbarb    Alcohol Level: No results found for this basename: ETH:2 in the last 168 hours   CT of the brain  07/23/2012 at [place]  MRI of the brain  IMPRESSION: Sub-centimeter acute infarction affecting the surface of a medial right frontoparietal junction region gyrus. Otherwise normal examination.   MRA of the brain  IMPRESSION: No major vessel occlusion or correctable proximal stenosis.  Suspicion of distal vessel atherosclerotic change. However, there is a good bit of motion degradation on the MR angiography portion of the exam. The lack of small vessel ischemic changes in the brain on the parenchymal imaging raises the question if this is a true finding or simply related  to motion degradation.  2D Echocardiogram  EF 55-60% with no source of embolus.  Carotid Doppler  No evidence of hemodynamically significant internal carotid artery stenosis. Vertebral artery flow is antegrade.   CXR  07/24/2012  No acute abnormalities.   EKG  normal sinus rhythm at  [place]  Therapy Recommendations PT - outpatient; OT - ; ST - none  Physical Exam  GENERAL EXAM: Patient is in no distress  CARDIOVASCULAR: Regular rate and rhythm, no murmurs, no carotid bruits  NEUROLOGIC: MENTAL STATUS: awake, alert, language fluent, comprehension intact, naming intact CRANIAL NERVE: pupils equal and reactive to light, visual fields full to confrontation, extraocular muscles intact, no nystagmus, facial sensation and strength symmetric, uvula midline, shoulder shrug symmetric, tongue midline. MOTOR: normal bulk and tone, full strength in the BUE, RLE; LLE 4/5. SENSORY: normal and symmetric to light touch COORDINATION: finger-nose-finger, fine finger movements normal REFLEXES: deep tendon reflexes present and symmetric GAIT/STATION: BED REST.  ASSESSMENT Ms. Rebecca Obrien is a 46 y.o. female presenting with weakness and numbness of the left lower extremity . Status post IV t-PA 07/23/2012 at 1155 at [facility], transferred to [facility] for management. 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  Work up underway. On no antiplatelet prior to admission. Now on aspirin 325 mg orally every day for secondary stroke prevention. Patient with resultant LLE hemiparesis that is improving   Rheumatoid arthritis, on remicade Hypertension Hyperlipidemia, LDL 132, not on statin PTA, goal LDL < 100 Cigarette smoker Obesity class 1 Body mass index is 31.22 kg/(m^2).  Hospital day # 3  TREATMENT/PLAN  Continue aspirin 325 mg orally every day for secondary stroke prevention   Stop smoking. Discussed that she talk with her RA physician remicade and recent stroke to decide on therapy. TEE was NEGATIVE.  Labs to look for hypercoagulable source:  Hypercoagulable panel (except Factor V Leiden & Beta-2-glycoprotein, which are both associated with venous not arterial infarcts), Vasculitic labs (C3, C4,  CH50, ESR, ANA) HIV & RPR Outpatient TCD bubble study with emboli monitoring with Dr. Pearlean Brownie in 1 month to further evaluate for possible PFO. Have patient call for appointment. (I have added to discharge instruction sheet).  Ambulatory telemetry monitoring arranged. Followup with Dr. Pearlean Brownie in 2 mos. Discharge to home.  Guy Franco, [facility],  MBA, MHA [facility] Pager: [phone] 07/26/2012 10:24 AM   Dr. Delia Heady, Stroke Center Medical Director.  has personally reviewed chart, pertinent data, examined the patient and developed the plan of care. Pager:  [phone]

## 2012-07-26 NOTE — CV Procedure (Signed)
    Transesophageal Echocardiogram Note  Rebecca Obrien [id number] 1966-05-14  Procedure: Transesophageal Echocardiogram Indications: CVA  Procedure Details Consent: Obtained Time Out: Verified patient identification, verified procedure, site/side was marked, verified correct patient position, special equipment/implants available, Radiology Safety Procedures followed,  medications/allergies/relevent history reviewed, required imaging and test results available.  Performed  Medications: Fentanyl: 100 mcg IV Versed: 10 mg  Left Ventrical:  normal  Mitral Valve: normal  Aortic Valve: normal  Tricuspid Valve: normal   Pulmonic Valve: not seen well  Left Atrium/ Left atrial appendage: no thrombus  Atrial septum: no ASD or PFO  Aorta: normal   Complications: No apparent complications Patient did tolerate procedure well.   Vesta Mixer, Montez Hageman., MD, [facility] 07/26/2012, 5:07 PM

## 2012-07-26 NOTE — Progress Notes (Signed)
  Echocardiogram Echocardiogram Transesophageal has been performed.  Rebecca Obrien FRANCES 07/26/2012, 5:53 PM

## 2012-07-26 NOTE — Evaluation (Signed)
Occupational Therapy Evaluation Patient Details Name: Rebecca Obrien MRN: [id number] DOB: 01-20-1966 Today's Date: 07/26/2012 Time: 4098-1191 OT Time Calculation (min): 22 min  OT Assessment / Plan / Recommendation Clinical Impression  Pt admitted with L LE weakness due to R fronto-parietal CVA for which she received tPa.  Pt continues to have some L LE weakness interfering with high level balance, but does is performing ADL independently.  No further OT or DME needs.    OT Assessment  Patient does not need any further OT services    Follow Up Recommendations  No OT follow up    Barriers to Discharge      Equipment Recommendations  None recommended by OT    Recommendations for Other Services    Frequency       Precautions / Restrictions Precautions Precautions: None   Pertinent Vitals/Pain No pain.    ADL  Eating/Feeding: Independent Where Assessed - Eating/Feeding: Edge of bed Grooming: Wash/dry hands;Independent Where Assessed - Grooming: Unsupported standing Upper Body Bathing: Independent Where Assessed - Upper Body Bathing: Unsupported standing Lower Body Bathing: Independent Where Assessed - Lower Body Bathing: Unsupported sit to stand Upper Body Dressing: Independent Where Assessed - Upper Body Dressing: Unsupported sitting Lower Body Dressing: Independent Where Assessed - Lower Body Dressing: Unsupported sit to stand Toilet Transfer: Independent Toilet Transfer Method: Sit to Barista: Comfort height toilet Toileting - Clothing Manipulation and Hygiene: Independent Where Assessed - Toileting Clothing Manipulation and Hygiene: Sit on 3-in-1 or toilet Transfers/Ambulation Related to ADLs: Ambulated rolling IV pole independently. ADL Comments: Independent in all aspects assessed.    OT Diagnosis:    OT Problem List:   OT Treatment Interventions:     OT Goals    Visit Information  Last OT Received On: 07/26/12 Assistance Needed:  +1    Subjective Data  Subjective: "It came on me all of a sudden." Patient Stated Goal: Return to PLOF.   Prior Functioning     Home Living Lives With: Spouse Available Help at Discharge: Family;Available PRN/intermittently Type of Home: House Home Access: Level entry Home Layout: One level Bathroom Shower/Tub: Engineer, manufacturing systems: Standard Home Adaptive Equipment: Grab bars in shower Prior Function Level of Independence: Independent Able to Take Stairs?: Yes Driving: Yes Vocation: Full time employment Communication Communication: No difficulties Dominant Hand: Right         Vision/Perception     Cognition  Overall Cognitive Status: Appears within functional limits for tasks assessed/performed Arousal/Alertness: Awake/alert Orientation Level: Appears intact for tasks assessed Behavior During Session: [facility] for tasks performed    Extremity/Trunk Assessment Right Upper Extremity Assessment RUE ROM/Strength/Tone: [facility] for tasks assessed Left Upper Extremity Assessment LUE ROM/Strength/Tone: WFL for tasks assessed Trunk Assessment Trunk Assessment: Normal     Mobility Bed Mobility Bed Mobility: Supine to Sit;Sit to Supine Supine to Sit: 6: Modified independent (Device/Increase time) Sit to Supine: 6: Modified independent (Device/Increase time) Transfers Sit to Stand: 7: Independent;From bed;From toilet Stand to Sit: 7: Independent;To toilet;To bed     Shoulder Instructions     Exercise     Balance     End of Session OT - End of Session Activity Tolerance: Patient tolerated treatment well Patient left: in chair;with call bell/phone within reach  GO     Evern Bio 07/26/2012, 9:34 AM [phone]

## 2012-07-26 NOTE — H&P (View-Only) (Signed)
Stroke Team Progress Note  HISTORY Rebecca Obrien is an 46 y.o. female history rheumatoid arthritis and hypertension who developed weakness and numbness of her left lower extremity at 10:30 this morning. She went to the emergency room at [facility] for evaluation. She had no weakness of left upper extremity nor facial weakness. There no changes in speech. CT scan of her head showed no signs of acute stroke nor hemorrhage. There is no previous history of stroke nor TIA. Patient has not been on antiplatelet therapy. She was given intravenous TPA, full dose and subsequently transferred to [facility] for further management. NIH stroke score prior to TPA was 3. NIH stroke score on arrival at [facility] was 1. She was admitted to the neuro ICU for further evaluation and treatment.  SUBJECTIVE No complaints. LLE still slightly weak.  OBJECTIVE Most recent Vital Signs: Filed Vitals:   07/25/12 0110 07/25/12 0310 07/25/12 0510 07/25/12 0900  BP: 116/62 116/70 126/66 135/84  Pulse: 65 70 73 66  Temp: 97.8 F (36.6 C) 97.9 F (36.6 C) 97.8 F (36.6 C) 98.2 F (36.8 C)  TempSrc:    Oral  Resp: 18 18 18 18   Height:      Weight:      SpO2: 96% 97% 96% 96%   IV Fluid Intake:      . [DISCONTINUED] sodium chloride 75 mL/hr at 07/24/12 0800    MEDICATIONS     . aspirin  325 mg Oral Daily  . atorvastatin  10 mg Oral q1800  . lisinopril  10 mg Oral Daily   And  . hydrochlorothiazide  12.5 mg Oral Daily  . [COMPLETED] LORazepam  1 mg Intravenous On Call  . [DISCONTINUED] lisinopril-hydrochlorothiazide  1 tablet Oral Daily  . [DISCONTINUED] pantoprazole  40 mg Oral QHS  . [DISCONTINUED] pantoprazole (PROTONIX) IV  40 mg Intravenous QHS   PRN:  acetaminophen, acetaminophen, labetalol, senna-docusate, [DISCONTINUED] ondansetron (ZOFRAN) IV  Diet:  Cardiac thin liquids Activity:  Bedrest DVT Prophylaxis:  SCDs   CLINICALLY SIGNIFICANT STUDIES Basic  Metabolic Panel: No results found for this basename: NA:2,K:2,CL:2,CO2:2,GLUCOSE:2,BUN:2,CREATININE:2,CALCIUM:2,MG:2,PHOS:2 in the last 168 hours Liver Function Tests: No results found for this basename: AST:2,ALT:2,ALKPHOS:2,BILITOT:2,PROT:2,ALBUMIN:2 in the last 168 hours CBC: No results found for this basename: WBC:2,NEUTROABS:2,HGB:2,HCT:2,MCV:2,PLT:2 in the last 168 hours Coagulation: No results found for this basename: LABPROT:4,INR:4 in the last 168 hours Cardiac Enzymes: No results found for this basename: CKTOTAL:3,CKMB:3,CKMBINDEX:3,TROPONINI:3 in the last 168 hours Urinalysis: No results found for this basename: COLORURINE:2,APPERANCEUR:2,LABSPEC:2,PHURINE:2,GLUCOSEU:2,HGBUR:2,BILIRUBINUR:2,KETONESUR:2,PROTEINUR:2,UROBILINOGEN:2,NITRITE:2,LEUKOCYTESUR:2 in the last 168 hours Lipid Panel    Component Value Date/Time   CHOL 190 07/24/2012 0435   TRIG 122 07/24/2012 0435   HDL 34* 07/24/2012 0435   CHOLHDL 5.6 07/24/2012 0435   VLDL 24 07/24/2012 0435   LDLCALC 132* 07/24/2012 0435   HgbA1C  Lab Results  Component Value Date   HGBA1C 5.6 07/24/2012   Urine Drug Screen:   No results found for this basename: labopia,  cocainscrnur,  labbenz,  amphetmu,  thcu,  labbarb    Alcohol Level: No results found for this basename: ETH:2 in the last 168 hours   CT of the brain  07/23/2012 at [place]  MRI of the brain  IMPRESSION:  Sub-centimeter acute infarction affecting the surface of a medial  right frontoparietal junction region gyrus. Otherwise normal  examination.   MRA of the brain  IMPRESSION:  No major vessel occlusion or correctable proximal stenosis.  Suspicion of distal vessel atherosclerotic change. However,  there  is a good bit of motion degradation on the MR angiography portion  of the exam. The lack of small vessel ischemic changes in the  brain on the parenchymal imaging raises the question if this is a  true finding or simply related to motion  degradation.   2D Echocardiogram  EF 55-60% with no source of embolus.  Carotid Doppler  No evidence of hemodynamically significant internal carotid artery stenosis. Vertebral artery flow is antegrade.   CXR  07/24/2012  No acute abnormalities.   EKG  normal sinus rhythm at [place]  Therapy Recommendations PT - outpatient; OT - ; ST - none  Physical Exam   GENERAL EXAM: Patient is in no distress  CARDIOVASCULAR: Regular rate and rhythm, no murmurs, no carotid bruits  NEUROLOGIC: MENTAL STATUS: awake, alert, language fluent, comprehension intact, naming intact CRANIAL NERVE: pupils equal and reactive to light, visual fields full to confrontation, extraocular muscles intact, no nystagmus, facial sensation and strength symmetric, uvula midline, shoulder shrug symmetric, tongue midline. MOTOR: normal bulk and tone, full strength in the BUE, RLE; LLE 4/5. SENSORY: normal and symmetric to light touch COORDINATION: finger-nose-finger, fine finger movements normal REFLEXES: deep tendon reflexes present and symmetric GAIT/STATION: BED REST.  ASSESSMENT Ms. Rebecca Obrien is a 46 y.o. female presenting with weakness and numbness of the left lower extremity . Status post IV t-PA 07/23/2012 at 1155 at [facility], transferred to [facility] for management. 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  Work up underway. On no antiplatelet prior to admission. Now on aspirin 325 mg orally every day for secondary stroke prevention. Patient with resultant LLE hemiparesis that is improving   Rheumatoid arthritis, on remicade Hypertension Hyperlipidemia, LDL 132, not on statin PTA, goal LDL < 100 Cigarette smoker Obesity class 1 Body mass index is 31.22 kg/(m^2).  Hospital day # 2  TREATMENT/PLAN  Continue aspirin 325 mg orally every day for secondary stroke prevention   Stop smoking TEE to look for embolic  source. Arranged with [facility] for Wednesday. Will need to be NPO after midnight. If positive for PFO (patent foramen ovale), check bilateral lower extremity venous dopplers to rule out DVT as possible source of stroke.  Labs to look for hypercoagulable source:  Hypercoagulable panel (except Factor V Leiden & Beta-2-glycoprotein, which are both associated with venous not arterial infarcts), Vasculitic labs (C3, C4, CH50, ESR, ANA) HIV & RPR  SHARON BIBY, MSN, RN, ANVP-BC, ANP-BC, [facility] Pager: [phone] 07/25/2012 10:54 AM  [facility] - Stroke Team Joycelyn Schmid, MD 07/25/2012, 10:54 AM   Please refer to [email or web address] for on-call Stroke MD

## 2012-07-27 ENCOUNTER — Encounter (HOSPITAL_COMMUNITY): Payer: Self-pay | Admitting: Cardiovascular Disease

## 2012-07-27 LAB — PROTEIN C, TOTAL: Protein C, Total: 97 % (ref 72–160)

## 2012-07-27 LAB — COMPLEMENT, TOTAL: Compl, Total (CH50): 59 U/mL (ref 31–60)

## 2012-07-27 NOTE — Care Management Note (Signed)
    Page 1 of 1   07/27/2012     12:08:53 PM   CARE MANAGEMENT NOTE 07/27/2012  Patient:  Rebecca Obrien, Rebecca Obrien   Account Number:  [id number]  Date Initiated:  07/27/2012  Documentation initiated by:  Jacquelynn Cree  Subjective/Objective Assessment:   Admitted with CVA.     Action/Plan:   PT/OT evals   Anticipated DC Date:  07/26/2012   Anticipated DC Plan:  HOME/SELF CARE      DC Planning Services  CM consult      Choice offered to / List presented to:             Status of service:  Completed, signed off Medicare Important Message given?   (If response is "NO", the following Medicare IM given date fields will be blank) Date Medicare IM given:   Date Additional Medicare IM given:    Discharge Disposition:  HOME/SELF CARE  Per UR Regulation:  Reviewed for med. necessity/level of care/duration of stay  If discussed at Long Length of Stay Meetings, dates discussed:    Comments:

## 2012-07-28 NOTE — Progress Notes (Signed)
Agree with below note. Danna Casella Helen Whitlow PT, DPT Pager: [phone] 

## 2012-08-07 ENCOUNTER — Encounter (INDEPENDENT_AMBULATORY_CARE_PROVIDER_SITE_OTHER): Payer: BC Managed Care – PPO

## 2012-08-07 ENCOUNTER — Encounter: Payer: Self-pay | Admitting: Internal Medicine

## 2012-08-07 DIAGNOSIS — R55 Syncope and collapse: Secondary | ICD-10-CM

## 2012-11-24 ENCOUNTER — Encounter: Payer: Self-pay | Admitting: Neurology

## 2012-12-06 ENCOUNTER — Telehealth: Payer: Self-pay | Admitting: *Deleted

## 2012-12-06 NOTE — Telephone Encounter (Signed)
Called patient left a message for her to call the office back to confirm appointment for 12-07-12 to see the NP CM

## 2012-12-07 ENCOUNTER — Encounter: Payer: Self-pay | Admitting: Nurse Practitioner

## 2012-12-07 NOTE — Telephone Encounter (Signed)
Patient no showed

## 2012-12-08 NOTE — Progress Notes (Signed)
This encounter was created in error - please disregard.

## 2013-12-12 DIAGNOSIS — M0579 Rheumatoid arthritis with rheumatoid factor of multiple sites without organ or systems involvement: Secondary | ICD-10-CM | POA: Insufficient documentation

## 2013-12-12 DIAGNOSIS — Z8673 Personal history of transient ischemic attack (TIA), and cerebral infarction without residual deficits: Secondary | ICD-10-CM | POA: Insufficient documentation

## 2015-10-20 DIAGNOSIS — M0579 Rheumatoid arthritis with rheumatoid factor of multiple sites without organ or systems involvement: Secondary | ICD-10-CM | POA: Diagnosis not present

## 2015-10-20 DIAGNOSIS — Z79899 Other long term (current) drug therapy: Secondary | ICD-10-CM | POA: Diagnosis not present

## 2015-10-20 DIAGNOSIS — M0609 Rheumatoid arthritis without rheumatoid factor, multiple sites: Secondary | ICD-10-CM | POA: Diagnosis not present

## 2015-11-10 ENCOUNTER — Ambulatory Visit: Payer: Self-pay | Admitting: Physician Assistant

## 2015-11-10 DIAGNOSIS — J329 Chronic sinusitis, unspecified: Secondary | ICD-10-CM

## 2015-11-10 DIAGNOSIS — J069 Acute upper respiratory infection, unspecified: Secondary | ICD-10-CM

## 2015-11-11 NOTE — Progress Notes (Signed)
See note in paper chart by Heather Ratcliffe, PAC  

## 2015-11-20 DIAGNOSIS — D2262 Melanocytic nevi of left upper limb, including shoulder: Secondary | ICD-10-CM | POA: Diagnosis not present

## 2015-11-20 DIAGNOSIS — L814 Other melanin hyperpigmentation: Secondary | ICD-10-CM | POA: Diagnosis not present

## 2015-11-20 DIAGNOSIS — C4372 Malignant melanoma of left lower limb, including hip: Secondary | ICD-10-CM | POA: Diagnosis not present

## 2015-11-20 DIAGNOSIS — D485 Neoplasm of uncertain behavior of skin: Secondary | ICD-10-CM | POA: Diagnosis not present

## 2015-12-03 DIAGNOSIS — C4372 Malignant melanoma of left lower limb, including hip: Secondary | ICD-10-CM | POA: Insufficient documentation

## 2015-12-04 DIAGNOSIS — C4372 Malignant melanoma of left lower limb, including hip: Secondary | ICD-10-CM | POA: Diagnosis not present

## 2015-12-09 DIAGNOSIS — C4372 Malignant melanoma of left lower limb, including hip: Secondary | ICD-10-CM | POA: Diagnosis not present

## 2015-12-22 DIAGNOSIS — I10 Essential (primary) hypertension: Secondary | ICD-10-CM | POA: Diagnosis not present

## 2015-12-22 DIAGNOSIS — C4372 Malignant melanoma of left lower limb, including hip: Secondary | ICD-10-CM | POA: Diagnosis not present

## 2015-12-22 DIAGNOSIS — Z8673 Personal history of transient ischemic attack (TIA), and cerebral infarction without residual deficits: Secondary | ICD-10-CM | POA: Diagnosis not present

## 2015-12-22 DIAGNOSIS — Z720 Tobacco use: Secondary | ICD-10-CM | POA: Diagnosis not present

## 2015-12-22 DIAGNOSIS — Z8739 Personal history of other diseases of the musculoskeletal system and connective tissue: Secondary | ICD-10-CM | POA: Diagnosis not present

## 2015-12-23 DIAGNOSIS — M069 Rheumatoid arthritis, unspecified: Secondary | ICD-10-CM | POA: Diagnosis not present

## 2015-12-23 DIAGNOSIS — Z8673 Personal history of transient ischemic attack (TIA), and cerebral infarction without residual deficits: Secondary | ICD-10-CM | POA: Diagnosis not present

## 2015-12-23 DIAGNOSIS — F419 Anxiety disorder, unspecified: Secondary | ICD-10-CM | POA: Diagnosis not present

## 2015-12-23 DIAGNOSIS — I10 Essential (primary) hypertension: Secondary | ICD-10-CM | POA: Diagnosis not present

## 2015-12-23 DIAGNOSIS — Z91040 Latex allergy status: Secondary | ICD-10-CM | POA: Diagnosis not present

## 2015-12-23 DIAGNOSIS — C774 Secondary and unspecified malignant neoplasm of inguinal and lower limb lymph nodes: Secondary | ICD-10-CM | POA: Diagnosis not present

## 2015-12-23 DIAGNOSIS — F40240 Claustrophobia: Secondary | ICD-10-CM | POA: Diagnosis not present

## 2015-12-23 DIAGNOSIS — F17210 Nicotine dependence, cigarettes, uncomplicated: Secondary | ICD-10-CM | POA: Diagnosis not present

## 2015-12-23 DIAGNOSIS — C4372 Malignant melanoma of left lower limb, including hip: Secondary | ICD-10-CM | POA: Diagnosis not present

## 2015-12-31 DIAGNOSIS — C439 Malignant melanoma of skin, unspecified: Secondary | ICD-10-CM | POA: Diagnosis not present

## 2015-12-31 DIAGNOSIS — C779 Secondary and unspecified malignant neoplasm of lymph node, unspecified: Secondary | ICD-10-CM | POA: Diagnosis not present

## 2016-01-14 DIAGNOSIS — C779 Secondary and unspecified malignant neoplasm of lymph node, unspecified: Secondary | ICD-10-CM | POA: Diagnosis not present

## 2016-01-15 DIAGNOSIS — C779 Secondary and unspecified malignant neoplasm of lymph node, unspecified: Secondary | ICD-10-CM

## 2016-01-15 DIAGNOSIS — C439 Malignant melanoma of skin, unspecified: Secondary | ICD-10-CM | POA: Insufficient documentation

## 2016-01-27 DIAGNOSIS — Z7952 Long term (current) use of systemic steroids: Secondary | ICD-10-CM | POA: Diagnosis not present

## 2016-01-27 DIAGNOSIS — M069 Rheumatoid arthritis, unspecified: Secondary | ICD-10-CM | POA: Diagnosis not present

## 2016-01-27 DIAGNOSIS — F40240 Claustrophobia: Secondary | ICD-10-CM | POA: Diagnosis not present

## 2016-01-27 DIAGNOSIS — I10 Essential (primary) hypertension: Secondary | ICD-10-CM | POA: Diagnosis not present

## 2016-01-27 DIAGNOSIS — F17210 Nicotine dependence, cigarettes, uncomplicated: Secondary | ICD-10-CM | POA: Diagnosis not present

## 2016-01-27 DIAGNOSIS — C4372 Malignant melanoma of left lower limb, including hip: Secondary | ICD-10-CM | POA: Diagnosis not present

## 2016-01-27 DIAGNOSIS — Z8673 Personal history of transient ischemic attack (TIA), and cerebral infarction without residual deficits: Secondary | ICD-10-CM | POA: Diagnosis not present

## 2016-01-27 DIAGNOSIS — C779 Secondary and unspecified malignant neoplasm of lymph node, unspecified: Secondary | ICD-10-CM | POA: Diagnosis not present

## 2016-01-27 DIAGNOSIS — C774 Secondary and unspecified malignant neoplasm of inguinal and lower limb lymph nodes: Secondary | ICD-10-CM | POA: Diagnosis not present

## 2016-01-27 DIAGNOSIS — F419 Anxiety disorder, unspecified: Secondary | ICD-10-CM | POA: Diagnosis not present

## 2016-01-27 DIAGNOSIS — C799 Secondary malignant neoplasm of unspecified site: Secondary | ICD-10-CM | POA: Diagnosis not present

## 2016-02-11 DIAGNOSIS — C439 Malignant melanoma of skin, unspecified: Secondary | ICD-10-CM | POA: Diagnosis not present

## 2016-02-11 DIAGNOSIS — C4372 Malignant melanoma of left lower limb, including hip: Secondary | ICD-10-CM | POA: Diagnosis not present

## 2016-02-11 DIAGNOSIS — C779 Secondary and unspecified malignant neoplasm of lymph node, unspecified: Secondary | ICD-10-CM | POA: Diagnosis not present

## 2016-02-17 DIAGNOSIS — Z4682 Encounter for fitting and adjustment of non-vascular catheter: Secondary | ICD-10-CM | POA: Diagnosis not present

## 2016-02-17 DIAGNOSIS — L7634 Postprocedural seroma of skin and subcutaneous tissue following other procedure: Secondary | ICD-10-CM | POA: Diagnosis not present

## 2016-02-17 DIAGNOSIS — Z760 Encounter for issue of repeat prescription: Secondary | ICD-10-CM | POA: Diagnosis not present

## 2016-02-17 DIAGNOSIS — C4372 Malignant melanoma of left lower limb, including hip: Secondary | ICD-10-CM | POA: Diagnosis not present

## 2016-02-17 DIAGNOSIS — R938 Abnormal findings on diagnostic imaging of other specified body structures: Secondary | ICD-10-CM | POA: Diagnosis not present

## 2016-02-17 DIAGNOSIS — C774 Secondary and unspecified malignant neoplasm of inguinal and lower limb lymph nodes: Secondary | ICD-10-CM | POA: Diagnosis not present

## 2016-02-25 DIAGNOSIS — C439 Malignant melanoma of skin, unspecified: Secondary | ICD-10-CM | POA: Diagnosis not present

## 2016-02-25 DIAGNOSIS — C779 Secondary and unspecified malignant neoplasm of lymph node, unspecified: Secondary | ICD-10-CM | POA: Diagnosis not present

## 2016-02-25 DIAGNOSIS — Z85820 Personal history of malignant melanoma of skin: Secondary | ICD-10-CM | POA: Diagnosis not present

## 2016-02-25 DIAGNOSIS — Z08 Encounter for follow-up examination after completed treatment for malignant neoplasm: Secondary | ICD-10-CM | POA: Diagnosis not present

## 2016-02-25 DIAGNOSIS — M069 Rheumatoid arthritis, unspecified: Secondary | ICD-10-CM | POA: Diagnosis not present

## 2016-02-25 DIAGNOSIS — Z85858 Personal history of malignant neoplasm of other endocrine glands: Secondary | ICD-10-CM | POA: Diagnosis not present

## 2016-03-08 DIAGNOSIS — L7634 Postprocedural seroma of skin and subcutaneous tissue following other procedure: Secondary | ICD-10-CM | POA: Diagnosis not present

## 2016-03-08 DIAGNOSIS — C4372 Malignant melanoma of left lower limb, including hip: Secondary | ICD-10-CM | POA: Diagnosis not present

## 2016-03-08 DIAGNOSIS — Z79899 Other long term (current) drug therapy: Secondary | ICD-10-CM | POA: Diagnosis not present

## 2016-03-08 DIAGNOSIS — C774 Secondary and unspecified malignant neoplasm of inguinal and lower limb lymph nodes: Secondary | ICD-10-CM | POA: Diagnosis not present

## 2016-03-12 DIAGNOSIS — I890 Lymphedema, not elsewhere classified: Secondary | ICD-10-CM | POA: Diagnosis not present

## 2016-03-12 DIAGNOSIS — C4372 Malignant melanoma of left lower limb, including hip: Secondary | ICD-10-CM | POA: Diagnosis not present

## 2016-03-12 DIAGNOSIS — R938 Abnormal findings on diagnostic imaging of other specified body structures: Secondary | ICD-10-CM | POA: Diagnosis not present

## 2016-03-12 DIAGNOSIS — Z79899 Other long term (current) drug therapy: Secondary | ICD-10-CM | POA: Diagnosis not present

## 2016-03-12 DIAGNOSIS — C774 Secondary and unspecified malignant neoplasm of inguinal and lower limb lymph nodes: Secondary | ICD-10-CM | POA: Diagnosis not present

## 2016-03-17 DIAGNOSIS — C439 Malignant melanoma of skin, unspecified: Secondary | ICD-10-CM | POA: Diagnosis not present

## 2016-03-17 DIAGNOSIS — C779 Secondary and unspecified malignant neoplasm of lymph node, unspecified: Secondary | ICD-10-CM | POA: Diagnosis not present

## 2016-03-30 DIAGNOSIS — C439 Malignant melanoma of skin, unspecified: Secondary | ICD-10-CM | POA: Diagnosis not present

## 2016-03-30 DIAGNOSIS — C779 Secondary and unspecified malignant neoplasm of lymph node, unspecified: Secondary | ICD-10-CM | POA: Diagnosis not present

## 2016-04-15 DIAGNOSIS — I10 Essential (primary) hypertension: Secondary | ICD-10-CM | POA: Diagnosis not present

## 2016-04-21 DIAGNOSIS — I890 Lymphedema, not elsewhere classified: Secondary | ICD-10-CM | POA: Diagnosis not present

## 2016-04-21 DIAGNOSIS — C439 Malignant melanoma of skin, unspecified: Secondary | ICD-10-CM | POA: Diagnosis not present

## 2016-04-21 DIAGNOSIS — C779 Secondary and unspecified malignant neoplasm of lymph node, unspecified: Secondary | ICD-10-CM | POA: Diagnosis not present

## 2016-05-11 DIAGNOSIS — Z85820 Personal history of malignant melanoma of skin: Secondary | ICD-10-CM | POA: Diagnosis not present

## 2016-05-11 DIAGNOSIS — L821 Other seborrheic keratosis: Secondary | ICD-10-CM | POA: Diagnosis not present

## 2016-05-11 DIAGNOSIS — Z1283 Encounter for screening for malignant neoplasm of skin: Secondary | ICD-10-CM | POA: Diagnosis not present

## 2016-05-11 DIAGNOSIS — D485 Neoplasm of uncertain behavior of skin: Secondary | ICD-10-CM | POA: Diagnosis not present

## 2016-05-11 DIAGNOSIS — Z08 Encounter for follow-up examination after completed treatment for malignant neoplasm: Secondary | ICD-10-CM | POA: Diagnosis not present

## 2016-05-19 DIAGNOSIS — C439 Malignant melanoma of skin, unspecified: Secondary | ICD-10-CM | POA: Diagnosis not present

## 2016-05-19 DIAGNOSIS — I890 Lymphedema, not elsewhere classified: Secondary | ICD-10-CM | POA: Diagnosis not present

## 2016-05-19 DIAGNOSIS — C779 Secondary and unspecified malignant neoplasm of lymph node, unspecified: Secondary | ICD-10-CM | POA: Diagnosis not present

## 2016-05-19 DIAGNOSIS — C4372 Malignant melanoma of left lower limb, including hip: Secondary | ICD-10-CM | POA: Diagnosis not present

## 2016-05-26 DIAGNOSIS — C439 Malignant melanoma of skin, unspecified: Secondary | ICD-10-CM | POA: Diagnosis not present

## 2016-05-26 DIAGNOSIS — C779 Secondary and unspecified malignant neoplasm of lymph node, unspecified: Secondary | ICD-10-CM | POA: Diagnosis not present

## 2016-06-02 DIAGNOSIS — D225 Melanocytic nevi of trunk: Secondary | ICD-10-CM | POA: Diagnosis not present

## 2016-06-02 DIAGNOSIS — D485 Neoplasm of uncertain behavior of skin: Secondary | ICD-10-CM | POA: Diagnosis not present

## 2016-06-08 DIAGNOSIS — C439 Malignant melanoma of skin, unspecified: Secondary | ICD-10-CM | POA: Diagnosis not present

## 2016-06-08 DIAGNOSIS — I10 Essential (primary) hypertension: Secondary | ICD-10-CM | POA: Diagnosis not present

## 2016-06-09 DIAGNOSIS — T8189XD Other complications of procedures, not elsewhere classified, subsequent encounter: Secondary | ICD-10-CM | POA: Diagnosis not present

## 2016-06-09 DIAGNOSIS — T8189XS Other complications of procedures, not elsewhere classified, sequela: Secondary | ICD-10-CM | POA: Diagnosis not present

## 2016-06-09 DIAGNOSIS — M069 Rheumatoid arthritis, unspecified: Secondary | ICD-10-CM | POA: Diagnosis not present

## 2016-06-09 DIAGNOSIS — F17210 Nicotine dependence, cigarettes, uncomplicated: Secondary | ICD-10-CM | POA: Diagnosis not present

## 2016-06-09 DIAGNOSIS — C774 Secondary and unspecified malignant neoplasm of inguinal and lower limb lymph nodes: Secondary | ICD-10-CM | POA: Diagnosis not present

## 2016-06-09 DIAGNOSIS — C4372 Malignant melanoma of left lower limb, including hip: Secondary | ICD-10-CM | POA: Diagnosis not present

## 2016-06-09 DIAGNOSIS — C779 Secondary and unspecified malignant neoplasm of lymph node, unspecified: Secondary | ICD-10-CM | POA: Diagnosis not present

## 2016-06-16 DIAGNOSIS — D225 Melanocytic nevi of trunk: Secondary | ICD-10-CM | POA: Diagnosis not present

## 2016-06-16 DIAGNOSIS — D485 Neoplasm of uncertain behavior of skin: Secondary | ICD-10-CM | POA: Diagnosis not present

## 2016-06-23 DIAGNOSIS — M79642 Pain in left hand: Secondary | ICD-10-CM | POA: Diagnosis not present

## 2016-06-23 DIAGNOSIS — Z8739 Personal history of other diseases of the musculoskeletal system and connective tissue: Secondary | ICD-10-CM | POA: Diagnosis not present

## 2016-06-23 DIAGNOSIS — M79641 Pain in right hand: Secondary | ICD-10-CM | POA: Diagnosis not present

## 2016-06-23 DIAGNOSIS — Z79899 Other long term (current) drug therapy: Secondary | ICD-10-CM | POA: Diagnosis not present

## 2016-06-27 DIAGNOSIS — C439 Malignant melanoma of skin, unspecified: Secondary | ICD-10-CM | POA: Diagnosis not present

## 2016-06-27 DIAGNOSIS — C779 Secondary and unspecified malignant neoplasm of lymph node, unspecified: Secondary | ICD-10-CM | POA: Diagnosis not present

## 2016-06-30 DIAGNOSIS — Z8739 Personal history of other diseases of the musculoskeletal system and connective tissue: Secondary | ICD-10-CM | POA: Diagnosis not present

## 2016-06-30 DIAGNOSIS — C779 Secondary and unspecified malignant neoplasm of lymph node, unspecified: Secondary | ICD-10-CM | POA: Diagnosis not present

## 2016-06-30 DIAGNOSIS — C439 Malignant melanoma of skin, unspecified: Secondary | ICD-10-CM | POA: Diagnosis not present

## 2016-07-13 DIAGNOSIS — Z5112 Encounter for antineoplastic immunotherapy: Secondary | ICD-10-CM

## 2016-07-13 DIAGNOSIS — Z5111 Encounter for antineoplastic chemotherapy: Secondary | ICD-10-CM | POA: Insufficient documentation

## 2016-07-14 DIAGNOSIS — M0579 Rheumatoid arthritis with rheumatoid factor of multiple sites without organ or systems involvement: Secondary | ICD-10-CM | POA: Diagnosis not present

## 2016-07-14 DIAGNOSIS — C779 Secondary and unspecified malignant neoplasm of lymph node, unspecified: Secondary | ICD-10-CM | POA: Diagnosis not present

## 2016-07-14 DIAGNOSIS — C4372 Malignant melanoma of left lower limb, including hip: Secondary | ICD-10-CM | POA: Diagnosis not present

## 2016-07-14 DIAGNOSIS — Z8673 Personal history of transient ischemic attack (TIA), and cerebral infarction without residual deficits: Secondary | ICD-10-CM | POA: Diagnosis not present

## 2016-07-14 DIAGNOSIS — C774 Secondary and unspecified malignant neoplasm of inguinal and lower limb lymph nodes: Secondary | ICD-10-CM | POA: Diagnosis not present

## 2016-07-14 DIAGNOSIS — F17210 Nicotine dependence, cigarettes, uncomplicated: Secondary | ICD-10-CM | POA: Diagnosis not present

## 2016-07-14 DIAGNOSIS — Z79899 Other long term (current) drug therapy: Secondary | ICD-10-CM | POA: Diagnosis not present

## 2016-07-14 DIAGNOSIS — Z720 Tobacco use: Secondary | ICD-10-CM | POA: Diagnosis not present

## 2016-07-14 DIAGNOSIS — C4922 Malignant neoplasm of connective and soft tissue of left lower limb, including hip: Secondary | ICD-10-CM | POA: Diagnosis not present

## 2016-07-14 DIAGNOSIS — Z5112 Encounter for antineoplastic immunotherapy: Secondary | ICD-10-CM | POA: Diagnosis not present

## 2016-08-04 DIAGNOSIS — Z7952 Long term (current) use of systemic steroids: Secondary | ICD-10-CM | POA: Diagnosis not present

## 2016-08-04 DIAGNOSIS — Z5111 Encounter for antineoplastic chemotherapy: Secondary | ICD-10-CM | POA: Diagnosis not present

## 2016-08-04 DIAGNOSIS — Z5112 Encounter for antineoplastic immunotherapy: Secondary | ICD-10-CM | POA: Diagnosis not present

## 2016-08-04 DIAGNOSIS — R7989 Other specified abnormal findings of blood chemistry: Secondary | ICD-10-CM | POA: Insufficient documentation

## 2016-08-04 DIAGNOSIS — R946 Abnormal results of thyroid function studies: Secondary | ICD-10-CM | POA: Diagnosis not present

## 2016-08-04 DIAGNOSIS — C4022 Malignant neoplasm of long bones of left lower limb: Secondary | ICD-10-CM | POA: Diagnosis not present

## 2016-08-04 DIAGNOSIS — M0579 Rheumatoid arthritis with rheumatoid factor of multiple sites without organ or systems involvement: Secondary | ICD-10-CM | POA: Diagnosis not present

## 2016-08-04 DIAGNOSIS — E0590 Thyrotoxicosis, unspecified without thyrotoxic crisis or storm: Secondary | ICD-10-CM | POA: Diagnosis not present

## 2016-08-04 DIAGNOSIS — C774 Secondary and unspecified malignant neoplasm of inguinal and lower limb lymph nodes: Secondary | ICD-10-CM | POA: Diagnosis not present

## 2016-08-04 DIAGNOSIS — C439 Malignant melanoma of skin, unspecified: Secondary | ICD-10-CM | POA: Diagnosis not present

## 2016-08-25 DIAGNOSIS — R946 Abnormal results of thyroid function studies: Secondary | ICD-10-CM | POA: Diagnosis not present

## 2016-08-25 DIAGNOSIS — M0579 Rheumatoid arthritis with rheumatoid factor of multiple sites without organ or systems involvement: Secondary | ICD-10-CM | POA: Diagnosis not present

## 2016-08-25 DIAGNOSIS — C779 Secondary and unspecified malignant neoplasm of lymph node, unspecified: Secondary | ICD-10-CM | POA: Diagnosis not present

## 2016-08-25 DIAGNOSIS — C439 Malignant melanoma of skin, unspecified: Secondary | ICD-10-CM | POA: Diagnosis not present

## 2016-08-25 DIAGNOSIS — Z5111 Encounter for antineoplastic chemotherapy: Secondary | ICD-10-CM | POA: Diagnosis not present

## 2016-08-25 DIAGNOSIS — Z8739 Personal history of other diseases of the musculoskeletal system and connective tissue: Secondary | ICD-10-CM | POA: Diagnosis not present

## 2016-08-25 DIAGNOSIS — Z5112 Encounter for antineoplastic immunotherapy: Secondary | ICD-10-CM | POA: Diagnosis not present

## 2016-08-25 DIAGNOSIS — C4372 Malignant melanoma of left lower limb, including hip: Secondary | ICD-10-CM | POA: Diagnosis not present

## 2016-09-15 DIAGNOSIS — R946 Abnormal results of thyroid function studies: Secondary | ICD-10-CM | POA: Diagnosis not present

## 2016-09-15 DIAGNOSIS — Z9289 Personal history of other medical treatment: Secondary | ICD-10-CM | POA: Diagnosis not present

## 2016-09-15 DIAGNOSIS — F17210 Nicotine dependence, cigarettes, uncomplicated: Secondary | ICD-10-CM | POA: Diagnosis not present

## 2016-09-15 DIAGNOSIS — C439 Malignant melanoma of skin, unspecified: Secondary | ICD-10-CM | POA: Diagnosis not present

## 2016-09-15 DIAGNOSIS — M0589 Other rheumatoid arthritis with rheumatoid factor of multiple sites: Secondary | ICD-10-CM | POA: Diagnosis not present

## 2016-09-15 DIAGNOSIS — Z5112 Encounter for antineoplastic immunotherapy: Secondary | ICD-10-CM | POA: Diagnosis not present

## 2016-09-15 DIAGNOSIS — C4372 Malignant melanoma of left lower limb, including hip: Secondary | ICD-10-CM | POA: Diagnosis not present

## 2016-09-15 DIAGNOSIS — Z5111 Encounter for antineoplastic chemotherapy: Secondary | ICD-10-CM | POA: Diagnosis not present

## 2016-09-15 DIAGNOSIS — C774 Secondary and unspecified malignant neoplasm of inguinal and lower limb lymph nodes: Secondary | ICD-10-CM | POA: Diagnosis not present

## 2016-09-28 DIAGNOSIS — D225 Melanocytic nevi of trunk: Secondary | ICD-10-CM | POA: Diagnosis not present

## 2016-09-28 DIAGNOSIS — E049 Nontoxic goiter, unspecified: Secondary | ICD-10-CM | POA: Diagnosis not present

## 2016-09-28 DIAGNOSIS — D485 Neoplasm of uncertain behavior of skin: Secondary | ICD-10-CM | POA: Diagnosis not present

## 2016-09-28 DIAGNOSIS — E063 Autoimmune thyroiditis: Secondary | ICD-10-CM | POA: Diagnosis not present

## 2016-09-28 DIAGNOSIS — L820 Inflamed seborrheic keratosis: Secondary | ICD-10-CM | POA: Diagnosis not present

## 2016-10-06 DIAGNOSIS — C4372 Malignant melanoma of left lower limb, including hip: Secondary | ICD-10-CM | POA: Diagnosis not present

## 2016-10-06 DIAGNOSIS — C439 Malignant melanoma of skin, unspecified: Secondary | ICD-10-CM | POA: Diagnosis not present

## 2016-10-06 DIAGNOSIS — F17210 Nicotine dependence, cigarettes, uncomplicated: Secondary | ICD-10-CM | POA: Diagnosis not present

## 2016-10-06 DIAGNOSIS — M0579 Rheumatoid arthritis with rheumatoid factor of multiple sites without organ or systems involvement: Secondary | ICD-10-CM | POA: Diagnosis not present

## 2016-10-06 DIAGNOSIS — Z5111 Encounter for antineoplastic chemotherapy: Secondary | ICD-10-CM | POA: Diagnosis not present

## 2016-10-06 DIAGNOSIS — C779 Secondary and unspecified malignant neoplasm of lymph node, unspecified: Secondary | ICD-10-CM | POA: Diagnosis not present

## 2016-10-06 DIAGNOSIS — Z5112 Encounter for antineoplastic immunotherapy: Secondary | ICD-10-CM | POA: Diagnosis not present

## 2016-10-06 DIAGNOSIS — Z90710 Acquired absence of both cervix and uterus: Secondary | ICD-10-CM | POA: Diagnosis not present

## 2016-10-27 DIAGNOSIS — E042 Nontoxic multinodular goiter: Secondary | ICD-10-CM | POA: Diagnosis not present

## 2016-10-27 DIAGNOSIS — E049 Nontoxic goiter, unspecified: Secondary | ICD-10-CM | POA: Diagnosis not present

## 2016-10-27 DIAGNOSIS — Z5111 Encounter for antineoplastic chemotherapy: Secondary | ICD-10-CM | POA: Diagnosis not present

## 2016-10-27 DIAGNOSIS — C4372 Malignant melanoma of left lower limb, including hip: Secondary | ICD-10-CM | POA: Diagnosis not present

## 2016-10-27 DIAGNOSIS — M0579 Rheumatoid arthritis with rheumatoid factor of multiple sites without organ or systems involvement: Secondary | ICD-10-CM | POA: Diagnosis not present

## 2016-10-27 DIAGNOSIS — M2540 Effusion, unspecified joint: Secondary | ICD-10-CM | POA: Diagnosis not present

## 2016-10-27 DIAGNOSIS — M0589 Other rheumatoid arthritis with rheumatoid factor of multiple sites: Secondary | ICD-10-CM | POA: Diagnosis not present

## 2016-10-27 DIAGNOSIS — E069 Thyroiditis, unspecified: Secondary | ICD-10-CM | POA: Diagnosis not present

## 2016-10-27 DIAGNOSIS — R52 Pain, unspecified: Secondary | ICD-10-CM | POA: Diagnosis not present

## 2016-10-27 DIAGNOSIS — C774 Secondary and unspecified malignant neoplasm of inguinal and lower limb lymph nodes: Secondary | ICD-10-CM | POA: Diagnosis not present

## 2016-10-27 DIAGNOSIS — Z79899 Other long term (current) drug therapy: Secondary | ICD-10-CM | POA: Diagnosis not present

## 2016-10-27 DIAGNOSIS — E876 Hypokalemia: Secondary | ICD-10-CM | POA: Diagnosis not present

## 2016-10-27 DIAGNOSIS — Z5112 Encounter for antineoplastic immunotherapy: Secondary | ICD-10-CM | POA: Diagnosis not present

## 2016-10-27 DIAGNOSIS — R946 Abnormal results of thyroid function studies: Secondary | ICD-10-CM | POA: Diagnosis not present

## 2016-11-17 DIAGNOSIS — Z5111 Encounter for antineoplastic chemotherapy: Secondary | ICD-10-CM | POA: Diagnosis not present

## 2016-11-17 DIAGNOSIS — F17210 Nicotine dependence, cigarettes, uncomplicated: Secondary | ICD-10-CM | POA: Diagnosis not present

## 2016-11-17 DIAGNOSIS — Z8673 Personal history of transient ischemic attack (TIA), and cerebral infarction without residual deficits: Secondary | ICD-10-CM | POA: Diagnosis not present

## 2016-11-17 DIAGNOSIS — C779 Secondary and unspecified malignant neoplasm of lymph node, unspecified: Secondary | ICD-10-CM | POA: Diagnosis not present

## 2016-11-17 DIAGNOSIS — C4372 Malignant melanoma of left lower limb, including hip: Secondary | ICD-10-CM | POA: Diagnosis not present

## 2016-11-17 DIAGNOSIS — Z85820 Personal history of malignant melanoma of skin: Secondary | ICD-10-CM | POA: Diagnosis not present

## 2016-11-17 DIAGNOSIS — R946 Abnormal results of thyroid function studies: Secondary | ICD-10-CM | POA: Diagnosis not present

## 2016-11-17 DIAGNOSIS — E079 Disorder of thyroid, unspecified: Secondary | ICD-10-CM | POA: Diagnosis not present

## 2016-11-17 DIAGNOSIS — E0510 Thyrotoxicosis with toxic single thyroid nodule without thyrotoxic crisis or storm: Secondary | ICD-10-CM | POA: Diagnosis not present

## 2016-11-17 DIAGNOSIS — E876 Hypokalemia: Secondary | ICD-10-CM | POA: Diagnosis not present

## 2016-11-17 DIAGNOSIS — M0579 Rheumatoid arthritis with rheumatoid factor of multiple sites without organ or systems involvement: Secondary | ICD-10-CM | POA: Diagnosis not present

## 2016-11-18 DIAGNOSIS — E876 Hypokalemia: Secondary | ICD-10-CM | POA: Diagnosis not present

## 2016-11-18 DIAGNOSIS — E0510 Thyrotoxicosis with toxic single thyroid nodule without thyrotoxic crisis or storm: Secondary | ICD-10-CM | POA: Diagnosis not present

## 2016-11-18 DIAGNOSIS — Z5111 Encounter for antineoplastic chemotherapy: Secondary | ICD-10-CM | POA: Diagnosis not present

## 2016-11-18 DIAGNOSIS — Z8673 Personal history of transient ischemic attack (TIA), and cerebral infarction without residual deficits: Secondary | ICD-10-CM | POA: Diagnosis not present

## 2016-11-18 DIAGNOSIS — R946 Abnormal results of thyroid function studies: Secondary | ICD-10-CM | POA: Diagnosis not present

## 2016-11-18 DIAGNOSIS — F17210 Nicotine dependence, cigarettes, uncomplicated: Secondary | ICD-10-CM | POA: Diagnosis not present

## 2016-11-18 DIAGNOSIS — M0579 Rheumatoid arthritis with rheumatoid factor of multiple sites without organ or systems involvement: Secondary | ICD-10-CM | POA: Diagnosis not present

## 2016-11-18 DIAGNOSIS — C779 Secondary and unspecified malignant neoplasm of lymph node, unspecified: Secondary | ICD-10-CM | POA: Diagnosis not present

## 2016-11-18 DIAGNOSIS — Z85820 Personal history of malignant melanoma of skin: Secondary | ICD-10-CM | POA: Diagnosis not present

## 2016-12-05 HISTORY — PX: EXCISION MELANOMA WITH SENTINEL LYMPH NODE BIOPSY: SHX5628

## 2016-12-08 DIAGNOSIS — Z5112 Encounter for antineoplastic immunotherapy: Secondary | ICD-10-CM | POA: Diagnosis not present

## 2016-12-08 DIAGNOSIS — C4372 Malignant melanoma of left lower limb, including hip: Secondary | ICD-10-CM | POA: Diagnosis not present

## 2016-12-08 DIAGNOSIS — C779 Secondary and unspecified malignant neoplasm of lymph node, unspecified: Secondary | ICD-10-CM | POA: Diagnosis not present

## 2016-12-08 DIAGNOSIS — E876 Hypokalemia: Secondary | ICD-10-CM | POA: Diagnosis not present

## 2016-12-08 DIAGNOSIS — E069 Thyroiditis, unspecified: Secondary | ICD-10-CM | POA: Diagnosis not present

## 2016-12-08 DIAGNOSIS — M0579 Rheumatoid arthritis with rheumatoid factor of multiple sites without organ or systems involvement: Secondary | ICD-10-CM | POA: Diagnosis not present

## 2016-12-08 DIAGNOSIS — Z5111 Encounter for antineoplastic chemotherapy: Secondary | ICD-10-CM | POA: Diagnosis not present

## 2016-12-08 DIAGNOSIS — C439 Malignant melanoma of skin, unspecified: Secondary | ICD-10-CM | POA: Diagnosis not present

## 2016-12-08 DIAGNOSIS — R946 Abnormal results of thyroid function studies: Secondary | ICD-10-CM | POA: Diagnosis not present

## 2016-12-14 DIAGNOSIS — E069 Thyroiditis, unspecified: Secondary | ICD-10-CM | POA: Diagnosis not present

## 2016-12-21 DIAGNOSIS — E041 Nontoxic single thyroid nodule: Secondary | ICD-10-CM | POA: Diagnosis not present

## 2016-12-21 DIAGNOSIS — E0500 Thyrotoxicosis with diffuse goiter without thyrotoxic crisis or storm: Secondary | ICD-10-CM | POA: Insufficient documentation

## 2016-12-21 DIAGNOSIS — R946 Abnormal results of thyroid function studies: Secondary | ICD-10-CM | POA: Diagnosis not present

## 2016-12-29 DIAGNOSIS — E069 Thyroiditis, unspecified: Secondary | ICD-10-CM | POA: Diagnosis not present

## 2016-12-29 DIAGNOSIS — Z8673 Personal history of transient ischemic attack (TIA), and cerebral infarction without residual deficits: Secondary | ICD-10-CM | POA: Diagnosis not present

## 2016-12-29 DIAGNOSIS — I10 Essential (primary) hypertension: Secondary | ICD-10-CM | POA: Diagnosis not present

## 2016-12-29 DIAGNOSIS — C439 Malignant melanoma of skin, unspecified: Secondary | ICD-10-CM | POA: Diagnosis not present

## 2016-12-29 DIAGNOSIS — E0500 Thyrotoxicosis with diffuse goiter without thyrotoxic crisis or storm: Secondary | ICD-10-CM | POA: Diagnosis not present

## 2016-12-29 DIAGNOSIS — C4372 Malignant melanoma of left lower limb, including hip: Secondary | ICD-10-CM | POA: Diagnosis not present

## 2016-12-29 DIAGNOSIS — Z08 Encounter for follow-up examination after completed treatment for malignant neoplasm: Secondary | ICD-10-CM | POA: Diagnosis not present

## 2016-12-29 DIAGNOSIS — E876 Hypokalemia: Secondary | ICD-10-CM | POA: Diagnosis not present

## 2016-12-29 DIAGNOSIS — Z01818 Encounter for other preprocedural examination: Secondary | ICD-10-CM | POA: Diagnosis not present

## 2016-12-29 DIAGNOSIS — M0579 Rheumatoid arthritis with rheumatoid factor of multiple sites without organ or systems involvement: Secondary | ICD-10-CM | POA: Diagnosis not present

## 2016-12-29 DIAGNOSIS — Z85828 Personal history of other malignant neoplasm of skin: Secondary | ICD-10-CM | POA: Diagnosis not present

## 2017-01-03 DIAGNOSIS — F17210 Nicotine dependence, cigarettes, uncomplicated: Secondary | ICD-10-CM | POA: Diagnosis not present

## 2017-01-03 DIAGNOSIS — E876 Hypokalemia: Secondary | ICD-10-CM | POA: Diagnosis not present

## 2017-01-03 DIAGNOSIS — Z79899 Other long term (current) drug therapy: Secondary | ICD-10-CM | POA: Diagnosis not present

## 2017-01-03 DIAGNOSIS — Z8673 Personal history of transient ischemic attack (TIA), and cerebral infarction without residual deficits: Secondary | ICD-10-CM | POA: Diagnosis not present

## 2017-01-03 DIAGNOSIS — M069 Rheumatoid arthritis, unspecified: Secondary | ICD-10-CM | POA: Diagnosis not present

## 2017-01-03 DIAGNOSIS — E069 Thyroiditis, unspecified: Secondary | ICD-10-CM | POA: Diagnosis not present

## 2017-01-03 DIAGNOSIS — E041 Nontoxic single thyroid nodule: Secondary | ICD-10-CM | POA: Diagnosis not present

## 2017-01-03 DIAGNOSIS — E0590 Thyrotoxicosis, unspecified without thyrotoxic crisis or storm: Secondary | ICD-10-CM | POA: Diagnosis not present

## 2017-01-03 DIAGNOSIS — Z85820 Personal history of malignant melanoma of skin: Secondary | ICD-10-CM | POA: Diagnosis not present

## 2017-01-03 DIAGNOSIS — Z90710 Acquired absence of both cervix and uterus: Secondary | ICD-10-CM | POA: Diagnosis not present

## 2017-01-03 DIAGNOSIS — I10 Essential (primary) hypertension: Secondary | ICD-10-CM | POA: Diagnosis not present

## 2017-01-03 DIAGNOSIS — E042 Nontoxic multinodular goiter: Secondary | ICD-10-CM | POA: Diagnosis not present

## 2017-01-25 DIAGNOSIS — E0500 Thyrotoxicosis with diffuse goiter without thyrotoxic crisis or storm: Secondary | ICD-10-CM | POA: Diagnosis not present

## 2017-01-25 DIAGNOSIS — Z9889 Other specified postprocedural states: Secondary | ICD-10-CM | POA: Diagnosis not present

## 2017-01-25 DIAGNOSIS — E890 Postprocedural hypothyroidism: Secondary | ICD-10-CM | POA: Diagnosis not present

## 2017-01-25 DIAGNOSIS — R946 Abnormal results of thyroid function studies: Secondary | ICD-10-CM | POA: Diagnosis not present

## 2017-01-26 DIAGNOSIS — Z79899 Other long term (current) drug therapy: Secondary | ICD-10-CM | POA: Diagnosis not present

## 2017-01-26 DIAGNOSIS — M0579 Rheumatoid arthritis with rheumatoid factor of multiple sites without organ or systems involvement: Secondary | ICD-10-CM | POA: Diagnosis not present

## 2017-01-26 DIAGNOSIS — E876 Hypokalemia: Secondary | ICD-10-CM | POA: Diagnosis not present

## 2017-01-26 DIAGNOSIS — Z5112 Encounter for antineoplastic immunotherapy: Secondary | ICD-10-CM | POA: Diagnosis not present

## 2017-01-26 DIAGNOSIS — C439 Malignant melanoma of skin, unspecified: Secondary | ICD-10-CM | POA: Diagnosis not present

## 2017-01-26 DIAGNOSIS — C779 Secondary and unspecified malignant neoplasm of lymph node, unspecified: Secondary | ICD-10-CM | POA: Diagnosis not present

## 2017-01-26 DIAGNOSIS — Z5111 Encounter for antineoplastic chemotherapy: Secondary | ICD-10-CM | POA: Diagnosis not present

## 2017-01-28 DIAGNOSIS — E890 Postprocedural hypothyroidism: Secondary | ICD-10-CM | POA: Insufficient documentation

## 2017-02-10 DIAGNOSIS — Z5112 Encounter for antineoplastic immunotherapy: Secondary | ICD-10-CM | POA: Diagnosis not present

## 2017-02-10 DIAGNOSIS — C779 Secondary and unspecified malignant neoplasm of lymph node, unspecified: Secondary | ICD-10-CM | POA: Diagnosis not present

## 2017-02-10 DIAGNOSIS — Z79899 Other long term (current) drug therapy: Secondary | ICD-10-CM | POA: Diagnosis not present

## 2017-02-10 DIAGNOSIS — C4372 Malignant melanoma of left lower limb, including hip: Secondary | ICD-10-CM | POA: Diagnosis not present

## 2017-02-16 ENCOUNTER — Telehealth: Payer: Self-pay | Admitting: Pharmacist

## 2017-02-16 NOTE — Telephone Encounter (Signed)
Called patient to schedule an appointment for the [employer] [facility]. I was unable to reach the patient so I left a HIPAA-compliant message requesting that the patient return my call.   

## 2017-02-24 ENCOUNTER — Ambulatory Visit: Payer: 59 | Attending: Internal Medicine | Admitting: Pharmacist

## 2017-02-24 DIAGNOSIS — Z79899 Other long term (current) drug therapy: Secondary | ICD-10-CM

## 2017-02-24 MED ORDER — TOFACITINIB CITRATE ER 11 MG PO TB24: 1.0000 | ORAL_TABLET | Freq: Every day | ORAL | 5 refills | Status: DC

## 2017-02-24 NOTE — Progress Notes (Signed)
   S: Patient presents to [facility] for review of their specialty medication therapy.  Patient is currently taking Xeljanz for rheumatoid arthritis. Patient is managed by Dr. Cherlynn Kaiser for this.   Has been on Remicade IV in the past.  Adherence: has not started yet  Efficacy: has not started yet  Dosing:  Rheumatoid arthritis (monotherapy or in combination with nonbiologic disease-modifying antirheumatic drugs (DMARDs): Oral: Extended release: 11 mg once daily   Monitoring: S/sx of infection: denies S/sx of malignancy: currently treated for metastatic malignant melanoma Bone marrow suppression: none currently, see labs below    O:    CareEverywhere Labs Hgb = 13.5 HCT = 41.3 WBC = 8.6 Lymphocyte count 1.5/ % = 17.7%  AST 17 ALT 18 SCr 0.6  A/P: 1. Medication review: patient currently taking Xeljanz for rheumatoid arthritis and is preparing to start tomorrow. Reviewed the medications with the patient including the following: Xeljanz (tofacitinib) is a Janus Associated Kinase Inhibitor which prevents cytokine- or growth factor-medicated gene expression and intracellular activity of immune cells, reduces circulating CD16/56+ natural killer cells, serum IgG, IgM, IgA, and C-reactive protein, and increases B cells. Adverse effects include increased risk of infection, risk of malignancy, as well as bone marrow suppression, GI upset, and decreased heart rate. . Patient should avoid live vaccinations while on this medication. No recommendations for changes at this time    Christella Hartigan, PharmD, BCPS, [place] [phone]

## 2017-02-28 ENCOUNTER — Telehealth: Payer: Self-pay | Admitting: Pharmacist

## 2017-02-28 NOTE — Telephone Encounter (Signed)
Received call from patient regarding prior authorization for Xeljanz. Patient reports it was not approved. It is currently undergoing appeal and patient should hear next week. Patient aware and will follow up with pharmacy.

## 2017-03-02 DIAGNOSIS — Z5112 Encounter for antineoplastic immunotherapy: Secondary | ICD-10-CM | POA: Diagnosis not present

## 2017-03-02 DIAGNOSIS — E063 Autoimmune thyroiditis: Secondary | ICD-10-CM | POA: Diagnosis not present

## 2017-03-02 DIAGNOSIS — Z79899 Other long term (current) drug therapy: Secondary | ICD-10-CM | POA: Diagnosis not present

## 2017-03-02 DIAGNOSIS — C4372 Malignant melanoma of left lower limb, including hip: Secondary | ICD-10-CM | POA: Diagnosis not present

## 2017-03-02 DIAGNOSIS — F17210 Nicotine dependence, cigarettes, uncomplicated: Secondary | ICD-10-CM | POA: Diagnosis not present

## 2017-03-02 DIAGNOSIS — C779 Secondary and unspecified malignant neoplasm of lymph node, unspecified: Secondary | ICD-10-CM | POA: Diagnosis not present

## 2017-03-02 DIAGNOSIS — I10 Essential (primary) hypertension: Secondary | ICD-10-CM | POA: Diagnosis not present

## 2017-03-02 DIAGNOSIS — E890 Postprocedural hypothyroidism: Secondary | ICD-10-CM | POA: Diagnosis not present

## 2017-03-02 DIAGNOSIS — E876 Hypokalemia: Secondary | ICD-10-CM | POA: Diagnosis not present

## 2017-03-02 DIAGNOSIS — M0579 Rheumatoid arthritis with rheumatoid factor of multiple sites without organ or systems involvement: Secondary | ICD-10-CM | POA: Diagnosis not present

## 2017-03-15 MED FILL — XELJANZ XR 11 MG TB24: 11 | 30 days supply | Qty: 30 | Fill #0

## 2017-03-21 DIAGNOSIS — Z79899 Other long term (current) drug therapy: Secondary | ICD-10-CM | POA: Diagnosis not present

## 2017-03-21 DIAGNOSIS — C439 Malignant melanoma of skin, unspecified: Secondary | ICD-10-CM | POA: Diagnosis not present

## 2017-03-21 DIAGNOSIS — C4372 Malignant melanoma of left lower limb, including hip: Secondary | ICD-10-CM | POA: Diagnosis not present

## 2017-03-21 DIAGNOSIS — C779 Secondary and unspecified malignant neoplasm of lymph node, unspecified: Secondary | ICD-10-CM | POA: Diagnosis not present

## 2017-03-28 DIAGNOSIS — M069 Rheumatoid arthritis, unspecified: Secondary | ICD-10-CM | POA: Diagnosis not present

## 2017-03-28 DIAGNOSIS — E890 Postprocedural hypothyroidism: Secondary | ICD-10-CM | POA: Diagnosis not present

## 2017-03-28 DIAGNOSIS — M0579 Rheumatoid arthritis with rheumatoid factor of multiple sites without organ or systems involvement: Secondary | ICD-10-CM | POA: Diagnosis not present

## 2017-03-28 DIAGNOSIS — C774 Secondary and unspecified malignant neoplasm of inguinal and lower limb lymph nodes: Secondary | ICD-10-CM | POA: Diagnosis not present

## 2017-03-28 DIAGNOSIS — L80 Vitiligo: Secondary | ICD-10-CM | POA: Diagnosis not present

## 2017-03-28 DIAGNOSIS — C439 Malignant melanoma of skin, unspecified: Secondary | ICD-10-CM | POA: Diagnosis not present

## 2017-03-28 DIAGNOSIS — C4372 Malignant melanoma of left lower limb, including hip: Secondary | ICD-10-CM | POA: Diagnosis not present

## 2017-03-28 DIAGNOSIS — E876 Hypokalemia: Secondary | ICD-10-CM | POA: Diagnosis not present

## 2017-03-28 DIAGNOSIS — I10 Essential (primary) hypertension: Secondary | ICD-10-CM | POA: Diagnosis not present

## 2017-03-28 DIAGNOSIS — Z5112 Encounter for antineoplastic immunotherapy: Secondary | ICD-10-CM | POA: Diagnosis not present

## 2017-03-28 DIAGNOSIS — Z7952 Long term (current) use of systemic steroids: Secondary | ICD-10-CM | POA: Diagnosis not present

## 2017-04-14 MED FILL — XELJANZ XR 11 MG TB24: 11 | 30 days supply | Qty: 30 | Fill #1

## 2017-04-18 DIAGNOSIS — Z5111 Encounter for antineoplastic chemotherapy: Secondary | ICD-10-CM | POA: Diagnosis not present

## 2017-04-18 DIAGNOSIS — E069 Thyroiditis, unspecified: Secondary | ICD-10-CM | POA: Diagnosis not present

## 2017-04-18 DIAGNOSIS — Z5112 Encounter for antineoplastic immunotherapy: Secondary | ICD-10-CM | POA: Diagnosis not present

## 2017-04-18 DIAGNOSIS — R946 Abnormal results of thyroid function studies: Secondary | ICD-10-CM | POA: Diagnosis not present

## 2017-04-18 DIAGNOSIS — E876 Hypokalemia: Secondary | ICD-10-CM | POA: Diagnosis not present

## 2017-04-18 DIAGNOSIS — C4359 Malignant melanoma of other part of trunk: Secondary | ICD-10-CM | POA: Diagnosis not present

## 2017-04-18 DIAGNOSIS — C439 Malignant melanoma of skin, unspecified: Secondary | ICD-10-CM | POA: Diagnosis not present

## 2017-04-18 DIAGNOSIS — I10 Essential (primary) hypertension: Secondary | ICD-10-CM | POA: Diagnosis not present

## 2017-04-18 DIAGNOSIS — E890 Postprocedural hypothyroidism: Secondary | ICD-10-CM | POA: Diagnosis not present

## 2017-04-18 DIAGNOSIS — C4372 Malignant melanoma of left lower limb, including hip: Secondary | ICD-10-CM | POA: Diagnosis not present

## 2017-04-18 DIAGNOSIS — C774 Secondary and unspecified malignant neoplasm of inguinal and lower limb lymph nodes: Secondary | ICD-10-CM | POA: Diagnosis not present

## 2017-04-18 DIAGNOSIS — M0579 Rheumatoid arthritis with rheumatoid factor of multiple sites without organ or systems involvement: Secondary | ICD-10-CM | POA: Diagnosis not present

## 2017-04-27 ENCOUNTER — Inpatient Hospital Stay
Admission: EM | Admit: 2017-04-27 | Discharge: 2017-04-30 | DRG: 392 | Disposition: A | Discharge status: Home / Self Care | Payer: 59 | Attending: General Surgery | Admitting: General Surgery

## 2017-04-27 ENCOUNTER — Encounter: Payer: Self-pay | Admitting: Emergency Medicine

## 2017-04-27 ENCOUNTER — Emergency Department: Payer: 59

## 2017-04-27 DIAGNOSIS — R109 Unspecified abdominal pain: Secondary | ICD-10-CM | POA: Diagnosis not present

## 2017-04-27 DIAGNOSIS — R1032 Left lower quadrant pain: Secondary | ICD-10-CM | POA: Diagnosis not present

## 2017-04-27 DIAGNOSIS — Z8673 Personal history of transient ischemic attack (TIA), and cerebral infarction without residual deficits: Secondary | ICD-10-CM

## 2017-04-27 DIAGNOSIS — Z85820 Personal history of malignant melanoma of skin: Secondary | ICD-10-CM

## 2017-04-27 DIAGNOSIS — K5720 Diverticulitis of large intestine with perforation and abscess without bleeding: Secondary | ICD-10-CM | POA: Diagnosis not present

## 2017-04-27 DIAGNOSIS — Z7982 Long term (current) use of aspirin: Secondary | ICD-10-CM | POA: Diagnosis not present

## 2017-04-27 DIAGNOSIS — F17210 Nicotine dependence, cigarettes, uncomplicated: Secondary | ICD-10-CM | POA: Diagnosis present

## 2017-04-27 DIAGNOSIS — K5792 Diverticulitis of intestine, part unspecified, without perforation or abscess without bleeding: Secondary | ICD-10-CM | POA: Diagnosis present

## 2017-04-27 DIAGNOSIS — Z79899 Other long term (current) drug therapy: Secondary | ICD-10-CM

## 2017-04-27 DIAGNOSIS — R1110 Vomiting, unspecified: Secondary | ICD-10-CM | POA: Diagnosis not present

## 2017-04-27 DIAGNOSIS — I10 Essential (primary) hypertension: Secondary | ICD-10-CM | POA: Diagnosis present

## 2017-04-27 DIAGNOSIS — Z90710 Acquired absence of both cervix and uterus: Secondary | ICD-10-CM

## 2017-04-27 DIAGNOSIS — R112 Nausea with vomiting, unspecified: Secondary | ICD-10-CM | POA: Diagnosis not present

## 2017-04-27 HISTORY — DX: Malignant (primary) neoplasm, unspecified: C80.1

## 2017-04-27 LAB — URINALYSIS, COMPLETE (UACMP) WITH MICROSCOPIC
BILIRUBIN URINE: NEGATIVE
Bacteria, UA: NONE SEEN
GLUCOSE, UA: NEGATIVE mg/dL
HGB URINE DIPSTICK: NEGATIVE
Ketones, ur: NEGATIVE mg/dL
LEUKOCYTES UA: NEGATIVE
NITRITE: NEGATIVE
Protein, ur: NEGATIVE mg/dL
SPECIFIC GRAVITY, URINE: 1.02 (ref 1.005–1.030)
pH: 5 (ref 5.0–8.0)

## 2017-04-27 LAB — CBC
HCT: 42.3 % (ref 35.0–47.0)
HEMOGLOBIN: 14.5 g/dL (ref 12.0–16.0)
MCH: 28.5 pg (ref 26.0–34.0)
MCHC: 34.3 g/dL (ref 32.0–36.0)
MCV: 83.1 fL (ref 80.0–100.0)
Platelets: 330 10*3/uL (ref 150–440)
RBC: 5.09 MIL/uL (ref 3.80–5.20)
RDW: 13.6 % (ref 11.5–14.5)
WBC: 15.4 10*3/uL — AB (ref 3.6–11.0)

## 2017-04-27 LAB — COMPREHENSIVE METABOLIC PANEL
ALBUMIN: 4.2 g/dL (ref 3.5–5.0)
ALK PHOS: 78 U/L (ref 38–126)
ALT: 20 U/L (ref 14–54)
ANION GAP: 11 (ref 5–15)
AST: 21 U/L (ref 15–41)
BILIRUBIN TOTAL: 1.3 mg/dL — AB (ref 0.3–1.2)
BUN: 15 mg/dL (ref 6–20)
CALCIUM: 9.4 mg/dL (ref 8.9–10.3)
CO2: 26 mmol/L (ref 22–32)
Chloride: 100 mmol/L — ABNORMAL LOW (ref 101–111)
Creatinine, Ser: 0.88 mg/dL (ref 0.44–1.00)
GFR calc Af Amer: >60 mL/min (ref >60)
GLUCOSE: 101 mg/dL — AB (ref 65–99)
Potassium: 3.4 mmol/L — ABNORMAL LOW (ref 3.5–5.1)
Sodium: 137 mmol/L (ref 135–145)
TOTAL PROTEIN: 8.4 g/dL — AB (ref 6.5–8.1)

## 2017-04-27 LAB — LIPASE, BLOOD: Lipase: 24 U/L (ref 11–51)

## 2017-04-27 MED ORDER — MORPHINE SULFATE (PF) 4 MG/ML IV SOLN
4.0000 mg | INTRAVENOUS | Status: DC | PRN
  Administered 2017-04-27: 4 mg via INTRAVENOUS
  Filled 2017-04-27: qty 1

## 2017-04-27 MED ORDER — ONDANSETRON 4 MG PO TBDP: 4.0000 mg | ORAL_TABLET | Freq: Four times a day (QID) | ORAL | Status: DC | PRN

## 2017-04-27 MED ORDER — ONDANSETRON HCL 4 MG/2ML IJ SOLN
4.0000 mg | Freq: Four times a day (QID) | INTRAMUSCULAR | Status: DC | PRN
  Administered 2017-04-27: 4 mg via INTRAVENOUS
  Filled 2017-04-27: qty 2

## 2017-04-27 MED ORDER — HYDRALAZINE HCL 20 MG/ML IJ SOLN: 10.0000 mg | INTRAMUSCULAR | Status: DC | PRN

## 2017-04-27 MED ORDER — IOPAMIDOL (ISOVUE-300) INJECTION 61%
100.0000 mL | Freq: Once | INTRAVENOUS | Status: AC | PRN
  Administered 2017-04-27: 100 mL via INTRAVENOUS
  Filled 2017-04-27: qty 100

## 2017-04-27 MED ORDER — DIPHENHYDRAMINE HCL 50 MG/ML IJ SOLN: 25.0000 mg | Freq: Four times a day (QID) | INTRAMUSCULAR | Status: DC | PRN

## 2017-04-27 MED ORDER — DEXTROSE IN LACTATED RINGERS 5 % IV SOLN
INTRAVENOUS | Status: DC
  Administered 2017-04-27 – 2017-04-30 (×5): via INTRAVENOUS

## 2017-04-27 MED ORDER — ENOXAPARIN SODIUM 40 MG/0.4ML ~~LOC~~ SOLN
40.0000 mg | SUBCUTANEOUS | Status: DC
  Administered 2017-04-27 – 2017-04-29 (×3): 40 mg via SUBCUTANEOUS
  Filled 2017-04-27 (×3): qty 0.4

## 2017-04-27 MED ORDER — ONDANSETRON 4 MG PO TBDP
4.0000 mg | ORAL_TABLET | Freq: Once | ORAL | Status: AC | PRN
  Administered 2017-04-27: 4 mg via ORAL
  Filled 2017-04-27: qty 1

## 2017-04-27 MED ORDER — PROMETHAZINE HCL 25 MG/ML IJ SOLN
12.5000 mg | Freq: Four times a day (QID) | INTRAMUSCULAR | Status: DC | PRN
  Administered 2017-04-27: 12.5 mg via INTRAVENOUS
  Filled 2017-04-27: qty 1

## 2017-04-27 MED ORDER — MORPHINE SULFATE (PF) 4 MG/ML IV SOLN
4.0000 mg | INTRAVENOUS | Status: DC | PRN
Start: 2017-04-27 — End: 2017-04-30
  Administered 2017-04-27 – 2017-04-30 (×12): 4 mg via INTRAVENOUS
  Filled 2017-04-27 (×12): qty 1

## 2017-04-27 MED ORDER — PIPERACILLIN-TAZOBACTAM 3.375 G IVPB 30 MIN
INTRAVENOUS | Status: AC
  Administered 2017-04-27: 3.375 g via INTRAVENOUS
  Filled 2017-04-27: qty 50

## 2017-04-27 MED ORDER — SODIUM CHLORIDE 0.9 % IV BOLUS (SEPSIS)
1000.0000 mL | Freq: Once | INTRAVENOUS | Status: AC
Start: 2017-04-27 — End: 2017-04-27
  Administered 2017-04-27: 1000 mL via INTRAVENOUS

## 2017-04-27 MED ORDER — PIPERACILLIN-TAZOBACTAM 3.375 G IVPB 30 MIN
3.3750 g | Freq: Once | INTRAVENOUS | Status: AC
  Administered 2017-04-27: 3.375 g via INTRAVENOUS

## 2017-04-27 MED ORDER — DIPHENHYDRAMINE HCL 25 MG PO CAPS: 25.0000 mg | ORAL_CAPSULE | Freq: Four times a day (QID) | ORAL | Status: DC | PRN

## 2017-04-27 MED ORDER — PANTOPRAZOLE SODIUM 40 MG IV SOLR
40.0000 mg | Freq: Every day | INTRAVENOUS | Status: DC
  Administered 2017-04-27 – 2017-04-29 (×3): 40 mg via INTRAVENOUS
  Filled 2017-04-27 (×3): qty 40

## 2017-04-27 MED ORDER — PIPERACILLIN-TAZOBACTAM 3.375 G IVPB
3.3750 g | Freq: Three times a day (TID) | INTRAVENOUS | Status: DC
  Administered 2017-04-27 – 2017-04-29 (×7): 3.375 g via INTRAVENOUS
  Filled 2017-04-27 (×8): qty 50

## 2017-04-27 NOTE — ED Triage Notes (Signed)
Pt presents via pov from work with n/v/d and LLQ pain x 2 days. Pt also reports chills. States she felt it coming on yesterday. She denies urinary symptoms. Pt alert & oriented with NAD noted.

## 2017-04-27 NOTE — H&P (Signed)
Patient ID: Rebecca Obrien, female   DOB: 10/20/1965, 51 y.o.   MRN: [id number]  CC: Abdominal pain  HPI Rebecca Obrien is a 51 y.o. female who presents to ER today with a 2 day history of left lower quadrant abdominal pain. She also reports associated nausea but denies any vomiting. She's never had any pain like this before. The pain is described as aching and pressure that does not radiate. As the pain worsened throughout today she decided to come the emergency room for further workup. She states she's had chills but denies any fevers. She states the pain worsened with walking throughout the day. She denies any nausea, vomiting, chest pain, shortness of breath, diarrhea, constipation. She is otherwise in her usual state of health.  HPI  Past Medical History:  Diagnosis Date  . Arthritis    RA  . Cancer ([place])    skin melanoma  . Hyperlipidemia   . Hypertension   . Obesity   . Rheumatoid arthritis(714.0)   . Stroke [facility])     Past Surgical History:  Procedure Laterality Date  . ABDOMINAL HYSTERECTOMY    . CARPAL TUNNEL RELEASE  Bilateral  . CHOLECYSTECTOMY    . TEE WITHOUT CARDIOVERSION  07/26/2012   Procedure: TRANSESOPHAGEAL ECHOCARDIOGRAM (TEE);  Surgeon: Thayer Headings, MD;  Location: [facility] ENDOSCOPY;  Service: Cardiovascular;  Laterality: N/A;  Recent thyroidectomy Previous melanoma wide local excision  Family history: Brother with cardiomyopathy, father with heart failure, high blood pressure. Mother with high blood pressure. No other known history of diabetes or cancers.  Social History Social History  Substance Use Topics  . Smoking status: Current Every Day Smoker    Packs/day: 0.25    Years: 23.00    Types: Cigarettes    Last attempt to quit: 07/23/2012  . Smokeless tobacco: Never Used  . Alcohol use No    No Known Allergies  Current Facility-Administered Medications  Medication Dose Route Frequency Provider Last Rate Last Dose  . dextrose 5 % in lactated  ringers infusion   Intravenous Continuous Clayburn Pert, MD 125 mL/hr at 04/27/17 1711    . diphenhydrAMINE (BENADRYL) capsule 25 mg  25 mg Oral Q6H PRN Clayburn Pert, MD       Or  . diphenhydrAMINE (BENADRYL) injection 25 mg  25 mg Intravenous Q6H PRN Clayburn Pert, MD      . enoxaparin (LOVENOX) injection 40 mg  40 mg Subcutaneous Q24H Clayburn Pert, MD      . hydrALAZINE (APRESOLINE) injection 10 mg  10 mg Intravenous Q2H PRN Clayburn Pert, MD      . morphine 4 MG/ML injection 4 mg  4 mg Intravenous Q4H PRN Clayburn Pert, MD      . ondansetron (ZOFRAN-ODT) disintegrating tablet 4 mg  4 mg Oral Q6H PRN Clayburn Pert, MD       Or  . ondansetron (ZOFRAN) injection 4 mg  4 mg Intravenous Q6H PRN Clayburn Pert, MD      . pantoprazole (PROTONIX) injection 40 mg  40 mg Intravenous QHS Clayburn Pert, MD      . piperacillin-tazobactam (ZOSYN) IVPB 3.375 g  3.375 g Intravenous Q8H Clayburn Pert, MD         Review of Systems A Multi-point review of systems was asked and was negative except for the findings documented in history of present illness  Physical Exam Blood pressure (!) 142/78, pulse 91, temperature 98.9 F (37.2 C), temperature source Oral, resp. rate 16, height 5\' 4"  (1.626  m), weight 75.8 kg (167 lb), SpO2 95 %. CONSTITUTIONAL: Resting in bed in no acute distress EYES: Pupils are equal, round, and reactive to light, Sclera are non-icteric. EARS, NOSE, MOUTH AND THROAT: The oropharynx is clear. The oral mucosa is pink and moist. Hearing is intact to voice. LYMPH NODES:  Lymph nodes in the neck are normal. RESPIRATORY:  Lungs are clear. There is normal respiratory effort, with equal breath sounds bilaterally, and without pathologic use of accessory muscles. CARDIOVASCULAR: Heart is regular without murmurs, gallops, or rubs. GI: The abdomen is soft, tender to palpation left lower quadrant but without rebound or guarding, and nondistended. There are no  palpable masses. There is no hepatosplenomegaly. There are normal bowel sounds in all quadrants. GU: Rectal deferred.   MUSCULOSKELETAL: Normal muscle strength and tone. No cyanosis or edema.   SKIN: Turgor is good and there are no pathologic skin lesions or ulcers. NEUROLOGIC: Motor and sensation is grossly normal. Cranial nerves are grossly intact. PSYCH:  Oriented to person, place and time. Affect is normal.  Data Reviewed Images and labs reviewed. Labs showed leukocytosis of 15.4. CT scan of the abdomen shows obvious inflammation of the sigmoid colon and a few dots of free air lateral to it consistent with a microperforation. I have personally reviewed the patient's imaging, laboratory findings and medical records.    Assessment    Diverticulitis with microperforation.    Plan    51 year old female with diverticulitis with microperforation. Discussed with the patient the diagnosis as well as the treatment. Discussed that for the majority of patients antibiotic therapy alone will allow the inflammation to proceed. However a percentage of patients did not respond to antibiotic therapy and require urgent operative intervention. Patient voiced understanding of this and agreed to accept admission for bowel rest, IV hydration, IV antibiotics. We will be following serial labs and serial abdominal exams.     Time spent with the patient was 50 minutes, with more than 50% of the time spent in face-to-face education, counseling and care coordination.     Clayburn Pert, MD FACS General Surgeon 04/27/2017, 7:14 PM

## 2017-04-27 NOTE — ED Provider Notes (Signed)
[facility] Emergency Department Provider Note    None    (approximate)  I have reviewed the triage vital signs and the nursing notes.   HISTORY  Chief Complaint Abdominal Pain    HPI Rebecca Obrien is a 51 y.o. female is a chief complaint of left lower quadrant pain associated with nausea but no vomiting. Describes the pain is aching and full pressure in the left lower quadrant without radiation. Patient started feeling unwell yesterday. She is having chills but no measured fevers. States that she try to come to work today but was having worsening pain even with walking. Patient never had pain like this before. Does have a history of melanoma as well as rheumatoid arthritis. Also has a history kidney stones but this feels different. Did have 2 episodes of loose diarrhea last night.   Past Medical History:  Diagnosis Date  . Arthritis    RA  . Cancer ([place])    skin melanoma  . Hyperlipidemia   . Hypertension   . Obesity   . Rheumatoid arthritis(714.0)   . Stroke [facility])    History reviewed. No pertinent family history. Past Surgical History:  Procedure Laterality Date  . ABDOMINAL HYSTERECTOMY    . CARPAL TUNNEL RELEASE  Bilateral  . CHOLECYSTECTOMY    . TEE WITHOUT CARDIOVERSION  07/26/2012   Procedure: TRANSESOPHAGEAL ECHOCARDIOGRAM (TEE);  Surgeon: Thayer Headings, MD;  Location: [place];  Service: Cardiovascular;  Laterality: N/A;   Patient Active Problem List   Diagnosis Date Noted  . Diverticulitis 04/27/2017  . CVA (cerebral infarction) 07/23/2012  . Hypertension 07/23/2012      Prior to Admission medications   Medication Sig Start Date End Date Taking? Authorizing Provider  aspirin 325 MG tablet Take 1 tablet (325 mg total) by mouth daily. 07/26/12   Abelina Bachelor, PA-C  atorvastatin (LIPITOR) 10 MG tablet Take 1 tablet (10 mg total) by mouth daily at 6 PM. 07/26/12   Abelina Bachelor, PA-C  hydrochlorothiazide (MICROZIDE) 12.5  MG capsule Take 1 capsule by mouth daily. 04/20/17   [provider]  leflunomide (ARAVA) 20 MG tablet Take 20 mg by mouth daily.    [provider]  levothyroxine (SYNTHROID, LEVOTHROID) 125 MCG tablet Take 125 mcg by mouth daily before breakfast.    [provider]  lisinopril (PRINIVIL,ZESTRIL) 20 MG tablet Take 1 tablet by mouth daily. 03/02/17   [provider]  Tofacitinib Citrate (XELJANZ XR) 11 MG TB24 Take 1 tablet by mouth daily. Patient not taking: Reported on 02/24/2017 02/24/17   Tresa Garter, MD    Allergies Patient has no known allergies.    Social History Social History  Substance Use Topics  . Smoking status: Current Every Day Smoker    Packs/day: 0.25    Years: 23.00    Types: Cigarettes    Last attempt to quit: 07/23/2012  . Smokeless tobacco: Never Used  . Alcohol use No    Review of Systems Patient denies headaches, rhinorrhea, blurry vision, numbness, shortness of breath, chest pain, edema, cough, abdominal pain, nausea, vomiting, diarrhea, dysuria, fevers, rashes or hallucinations unless otherwise stated above in HPI. ____________________________________________   PHYSICAL EXAM:  VITAL SIGNS: Vitals:   04/27/17 1322 04/27/17 1533  BP: (!) 124/103 (!) 158/87  Pulse: (!) 112 95  Resp: 16 16  Temp: 98.9 F (37.2 C)   SpO2: 98% 97%    Constitutional: Alert and oriented. Well appearing and in no acute  distress. Eyes: Conjunctivae are normal.  Head: Atraumatic. Nose: No congestion/rhinnorhea. Mouth/Throat: Mucous membranes are moist.   Neck: No stridor. Painless ROM.  Cardiovascular: Normal rate, regular rhythm. Grossly normal heart sounds.  Good peripheral circulation. Respiratory: Normal respiratory effort.  No retractions. Lungs CTAB. Gastrointestinal: Soft with + ttp in llq. No distention. No abdominal bruits. No CVA tenderness. Genitourinary:  Musculoskeletal: No lower extremity tenderness nor edema.   No joint effusions. Neurologic:  Normal speech and language. No gross focal neurologic deficits are appreciated. No facial droop Skin:  Skin is warm, dry and intact. No rash noted. Psychiatric: Mood and affect are normal. Speech and behavior are normal.  ____________________________________________   LABS (all labs ordered are listed, but only abnormal results are displayed)  Results for orders placed or performed during the hospital encounter of 04/27/17 (from the past 24 hour(s))  Lipase, blood     Status: None   Collection Time: 04/27/17 11:47 AM  Result Value Ref Range   Lipase 24 11 - 51 U/L  Comprehensive metabolic panel     Status: Abnormal   Collection Time: 04/27/17 11:47 AM  Result Value Ref Range   Sodium 137 135 - 145 mmol/L   Potassium 3.4 (L) 3.5 - 5.1 mmol/L   Chloride 100 (L) 101 - 111 mmol/L   CO2 26 22 - 32 mmol/L   Glucose, Bld 101 (H) 65 - 99 mg/dL   BUN 15 6 - 20 mg/dL   Creatinine, Ser 0.88 0.44 - 1.00 mg/dL   Calcium 9.4 8.9 - 10.3 mg/dL   Total Protein 8.4 (H) 6.5 - 8.1 g/dL   Albumin 4.2 3.5 - 5.0 g/dL   AST 21 15 - 41 U/L   ALT 20 14 - 54 U/L   Alkaline Phosphatase 78 38 - 126 U/L   Total Bilirubin 1.3 (H) 0.3 - 1.2 mg/dL   GFR calc non Af Amer >60 >60 mL/min   GFR calc Af Amer >60 >60 mL/min   Anion gap 11 5 - 15  CBC     Status: Abnormal   Collection Time: 04/27/17 11:47 AM  Result Value Ref Range   WBC 15.4 (H) 3.6 - 11.0 K/uL   RBC 5.09 3.80 - 5.20 MIL/uL   Hemoglobin 14.5 12.0 - 16.0 g/dL   HCT 42.3 35.0 - 47.0 %   MCV 83.1 80.0 - 100.0 fL   MCH 28.5 26.0 - 34.0 pg   MCHC 34.3 32.0 - 36.0 g/dL   RDW 13.6 11.5 - 14.5 %   Platelets 330 150 - 440 K/uL  Urinalysis, Complete w Microscopic     Status: Abnormal   Collection Time: 04/27/17 11:47 AM  Result Value Ref Range   Color, Urine AMBER (A) YELLOW   APPearance HAZY (A) CLEAR   Specific Gravity, Urine 1.020 1.005 - 1.030   pH 5.0 5.0 - 8.0   Glucose, UA NEGATIVE NEGATIVE mg/dL   Hgb  urine dipstick NEGATIVE NEGATIVE   Bilirubin Urine NEGATIVE NEGATIVE   Ketones, ur NEGATIVE NEGATIVE mg/dL   Protein, ur NEGATIVE NEGATIVE mg/dL   Nitrite NEGATIVE NEGATIVE   Leukocytes, UA NEGATIVE NEGATIVE   RBC / HPF 0-5 0 - 5 RBC/hpf   WBC, UA 0-5 0 - 5 WBC/hpf   Bacteria, UA NONE SEEN NONE SEEN   Squamous Epithelial / LPF 0-5 (A) NONE SEEN   Mucus PRESENT    ____________________________________________  EKG My review and personal interpretation at Time: 15:45   Indication: perop  Rate: 95  Rhythm: sinus Axis: normal Other: non specific st changes, no stemi ____________________________________________  RADIOLOGY  I personally reviewed all radiographic images ordered to evaluate for the above acute complaints and reviewed radiology reports and findings.  These findings were personally discussed with the patient.  Please see medical record for radiology report.  ____________________________________________   PROCEDURES  Procedure(s) performed:  Procedures    Critical Care performed: no ____________________________________________   INITIAL IMPRESSION / ASSESSMENT AND PLAN / ED COURSE  Pertinent labs & imaging results that were available during my care of the patient were reviewed by me and considered in my medical decision making (see chart for details).  DDX: diverticulitis, abscess, perf, colitis, stone, hernia  Rebecca Obrien is a 51 y.o. who presents to the ED with Acute left lower quadrant pain as described above. Patient is exquisitely tender with localized peritonitis. Blood work shows evidence of acute leukocytosis. No evidence of urinary tract infection. Patient gives a good story of somatic fevers at home concerning for infectious process. Based on her presentation and acute pain I will provide IV fluids as well as IV pain medication. Will order CT imaging to evaluate for the above differential.  Clinical Course as of Apr 27 1554  Wed Apr 27, 2017  1526  Hemoglobin: 14.5 [PR]  1540 CT shows evidence of diverticulitis with microperforation. Have ordered IV antibiotics and spoken with Dr. Adonis Huguenin of general surgery who is currently agreed to admit patient for further evaluation and management. She remains hemodynamic stable at this time.  [PR]    Clinical Course User Index [PR] Merlyn Lot, MD     ____________________________________________   FINAL CLINICAL IMPRESSION(S) / ED DIAGNOSES  Final diagnoses:  Acute abdominal pain in left lower quadrant  Diverticulitis of large intestine with perforation without bleeding      NEW MEDICATIONS STARTED DURING THIS VISIT:  New Prescriptions   No medications on file     Note:  This document was prepared using Dragon voice recognition software and may include unintentional dictation errors.    Merlyn Lot, MD 04/27/17 [id number]

## 2017-04-28 DIAGNOSIS — K5720 Diverticulitis of large intestine with perforation and abscess without bleeding: Secondary | ICD-10-CM

## 2017-04-28 LAB — CBC
HCT: 33.9 % — ABNORMAL LOW (ref 35.0–47.0)
HEMOGLOBIN: 11.8 g/dL — AB (ref 12.0–16.0)
MCH: 29 pg (ref 26.0–34.0)
MCHC: 34.8 g/dL (ref 32.0–36.0)
MCV: 83.5 fL (ref 80.0–100.0)
PLATELETS: 265 10*3/uL (ref 150–440)
RBC: 4.06 MIL/uL (ref 3.80–5.20)
RDW: 13.8 % (ref 11.5–14.5)
WBC: 13.6 10*3/uL — AB (ref 3.6–11.0)

## 2017-04-28 LAB — MAGNESIUM: Magnesium: 1.9 mg/dL (ref 1.7–2.4)

## 2017-04-28 LAB — COMPREHENSIVE METABOLIC PANEL
ALK PHOS: 60 U/L (ref 38–126)
ALT: 19 U/L (ref 14–54)
AST: 19 U/L (ref 15–41)
Albumin: 3.3 g/dL — ABNORMAL LOW (ref 3.5–5.0)
Anion gap: 9 (ref 5–15)
BUN: 14 mg/dL (ref 6–20)
CALCIUM: 8.2 mg/dL — AB (ref 8.9–10.3)
CHLORIDE: 101 mmol/L (ref 101–111)
CO2: 28 mmol/L (ref 22–32)
CREATININE: 0.81 mg/dL (ref 0.44–1.00)
GFR calc Af Amer: >60 mL/min (ref >60)
GFR calc non Af Amer: >60 mL/min (ref >60)
GLUCOSE: 111 mg/dL — AB (ref 65–99)
Potassium: 3.1 mmol/L — ABNORMAL LOW (ref 3.5–5.1)
SODIUM: 138 mmol/L (ref 135–145)
Total Bilirubin: 1.1 mg/dL (ref 0.3–1.2)
Total Protein: 6.6 g/dL (ref 6.5–8.1)

## 2017-04-28 LAB — PHOSPHORUS: Phosphorus: 2.7 mg/dL (ref 2.5–4.6)

## 2017-04-28 LAB — HIV ANTIBODY (ROUTINE TESTING W REFLEX): HIV Screen 4th Generation wRfx: NONREACTIVE

## 2017-04-28 MED ORDER — ACETAMINOPHEN 325 MG PO TABS
650.0000 mg | ORAL_TABLET | ORAL | Status: DC | PRN
  Administered 2017-04-28 (×2): 650 mg via ORAL
  Filled 2017-04-28 (×2): qty 2

## 2017-04-28 NOTE — Progress Notes (Signed)
CC: Diverticulitis Subjective: Patient admitted yesterday afternoon for diverticulitis with micro-perforation. Patient reports feeling better since admission. Continues to have some abdominal pain but states it is a fraction of was on presentation to the ER.  Objective: Vital signs in last 24 hours: Temp:  [98.7 F (37.1 C)-102.9 F (39.4 C)] 102.8 F (39.3 C) (08/23 1249) Pulse Rate:  [81-103] 94 (08/23 1249) Resp:  [16-19] 19 (08/23 0537) BP: (96-158)/(54-87) 131/70 (08/23 1249) SpO2:  [93 %-97 %] 93 % (08/23 1249) Last BM Date: 04/27/17  Intake/Output from previous day: 08/22 0701 - 08/23 0700 In: 2460.5 [I.V.:1310.5; IV Piggyback:1150] Out: 650 [Urine:650] Intake/Output this shift: No intake/output data recorded.  Physical exam:  Gen.: No acute distress Chest: Clear to auscultation  heart: Regular rhythm Abdomen: Soft, nondistended, minimally tender to deep palpation in the left lower quadrant.  Lab Results: CBC   Recent Labs  04/27/17 1147 04/28/17 0304  WBC 15.4* 13.6*  HGB 14.5 11.8*  HCT 42.3 33.9*  PLT 330 265   BMET  Recent Labs  04/27/17 1147 04/28/17 0304  NA 137 138  K 3.4* 3.1*  CL 100* 101  CO2 26 28  GLUCOSE 101* 111*  BUN 15 14  CREATININE 0.88 0.81  CALCIUM 9.4 8.2*   PT/INR No results for input(s): LABPROT, INR in the last 72 hours. ABG No results for input(s): PHART, HCO3 in the last 72 hours.  Invalid input(s): PCO2, PO2  Studies/Results: Ct Abdomen Pelvis W Contrast  Result Date: 04/27/2017 CLINICAL DATA:  Lower abdominal pain with nausea and vomiting EXAM: CT ABDOMEN AND PELVIS WITH CONTRAST TECHNIQUE: Multidetector CT imaging of the abdomen and pelvis was performed using the standard protocol following bolus administration of intravenous contrast. CONTRAST:  178mL ISOVUE-300 IOPAMIDOL (ISOVUE-300) INJECTION 61% COMPARISON:  None. FINDINGS: Lower chest: Lung bases are clear. Hepatobiliary: There is hepatic steatosis. Hepatic  steatosis is somewhat more pronounced near the fissure for the ligamentum teres than elsewhere. There is a cyst in the posterior aspect of the left lobe of the liver measuring 1.8 x 1.8 cm. No other focal liver lesion is evident. Gallbladder is absent. There is no appreciable biliary duct dilatation. Pancreas: There is no pancreatic mass or inflammatory focus. Spleen: No splenic lesions are evident. Adrenals/Urinary Tract: There is a cyst arising from the anterior, medial aspect of the mid left kidney measuring 1.1 x 1.0 cm. There is no appreciable hydronephrosis on either side. There is no renal or ureteral calculus on either side. Urinary bladder is midline with wall thickness within normal limits. Stomach/Bowel: There is wall thickening in the distal sigmoid colon with surrounding mesenteric stranding. There are several areas of microperforation in the area immediately surrounding this diverticulitis in the lower sigmoid colon. No abscess is seen in this area. Elsewhere, there are diverticula in the descending and proximal sigmoid colon regions without evident inflammatory change. No inflammation is seen elsewhere involving bowel. There is no bowel obstruction. No evident free air beyond the areas of microperforation in the lower pelvis. There is no portal venous air. Vascular/Lymphatic: There are foci of atherosclerotic calcification in the aorta and proximal common iliac arteries. No aneurysm evident. Major mesenteric vessels appear patent. There is no adenopathy in the abdomen or pelvis. Reproductive: Uterus is absent.  There is no pelvic mass. Other: Appendix appears normal. There is no abscess or ascites in the abdomen or pelvis. There is a minimal ventral hernia containing only fat. Musculoskeletal: There is degenerative change in the lumbar spine. There are  no blastic or lytic bone lesions. IMPRESSION: 1. Distal sigmoid diverticulitis. There are areas of microperforation in this area. No abscess evident.  2. There are multiple left-sided colonic diverticula proximal to the diverticulitis without inflammation in these areas. 3. No bowel obstruction. No abscess in the abdomen pelvis. Appendix appears normal. 4.  No renal or ureteral calculus.  No hydronephrosis. 5.  Hepatic steatosis. 6.  Uterus and gallbladder absent. 7.  Aortoiliac atherosclerosis. 8.  Minimal ventral hernia containing only fat. Aortic Atherosclerosis (ICD10-I70.0). Critical Value/emergent results were called by telephone at the time of interpretation on 04/27/2017 at 3:22 pm to Dr. Mariea Clonts, ED physician, who verbally acknowledged these results. Electronically Signed   By: Lowella Grip III M.D.   On: 04/27/2017 15:26    Anti-infectives: Anti-infectives    Start     Dose/Rate Route Frequency Ordered Stop   04/27/17 2200  piperacillin-tazobactam (ZOSYN) IVPB 3.375 g     3.375 g 12.5 mL/hr over 240 Minutes Intravenous Every 8 hours 04/27/17 1654     04/27/17 1530  piperacillin-tazobactam (ZOSYN) IVPB 3.375 g     3.375 g 100 mL/hr over 30 Minutes Intravenous  Once 04/27/17 1529 04/27/17 1605      Assessment/Plan:  51 year old female admitted for diverticulitis with microperforation. Clinically improved since starting antibiotics. Discussed with the patient that we would continue the IV antibiotics until her pain was gone and her labs were normal. She voiced understanding and agrees with this plan. Encourage ambulation and incentive spirometer usage.  Crystol Walpole T. Adonis Huguenin, MD, [facility] General Surgeon [facility]  Day [facility] [phone] Night [facility] [phone] 04/28/2017

## 2017-04-29 LAB — BASIC METABOLIC PANEL
ANION GAP: 8 (ref 5–15)
BUN: 8 mg/dL (ref 6–20)
CALCIUM: 8 mg/dL — AB (ref 8.9–10.3)
CHLORIDE: 101 mmol/L (ref 101–111)
CO2: 27 mmol/L (ref 22–32)
CREATININE: 0.75 mg/dL (ref 0.44–1.00)
GFR calc non Af Amer: >60 mL/min (ref >60)
Glucose, Bld: 112 mg/dL — ABNORMAL HIGH (ref 65–99)
Potassium: 2.9 mmol/L — ABNORMAL LOW (ref 3.5–5.1)
SODIUM: 136 mmol/L (ref 135–145)

## 2017-04-29 LAB — CBC
HEMATOCRIT: 32.3 % — AB (ref 35.0–47.0)
HEMOGLOBIN: 11.3 g/dL — AB (ref 12.0–16.0)
MCH: 29 pg (ref 26.0–34.0)
MCHC: 34.9 g/dL (ref 32.0–36.0)
MCV: 83.1 fL (ref 80.0–100.0)
Platelets: 232 10*3/uL (ref 150–440)
RBC: 3.89 MIL/uL (ref 3.80–5.20)
RDW: 13.2 % (ref 11.5–14.5)
WBC: 10.3 10*3/uL (ref 3.6–11.0)

## 2017-04-29 MED ORDER — POTASSIUM CHLORIDE 10 MEQ/100ML IV SOLN
10.0000 meq | INTRAVENOUS | Status: AC
  Administered 2017-04-29 (×4): 10 meq via INTRAVENOUS
  Filled 2017-04-29 (×4): qty 100

## 2017-04-29 NOTE — Progress Notes (Signed)
CC: Diverticulitis Subjective: 51 year old female with diverticulitis. Started having diarrhea overnight. Otherwise had a one-time fever spike yesterday early and none since. States her pain is much improved.  Objective: Vital signs in last 24 hours: Temp:  [98.5 F (36.9 C)-102.8 F (39.3 C)] 99.7 F (37.6 C) (08/24 0501) Pulse Rate:  [92-98] 92 (08/24 0501) Resp:  [20] 20 (08/23 2116) BP: (114-131)/(65-71) 114/65 (08/24 0501) SpO2:  [93 %-96 %] 96 % (08/24 0501) Last BM Date: 04/29/17  Intake/Output from previous day: 08/23 0701 - 08/24 0700 In: 2918.8 [I.V.:2818.8; IV Piggyback:100] Out: 1500 [Urine:1500] Intake/Output this shift: No intake/output data recorded.  Physical exam:  Gen.: No acute distress  chest: Clear to auscultation  heart: Rate and rhythm Abdomen: Soft, nondistended, minimally tender to single point palpation in left lower quadrant.  Lab Results: CBC   Recent Labs  04/28/17 0304 04/29/17 0440  WBC 13.6* 10.3  HGB 11.8* 11.3*  HCT 33.9* 32.3*  PLT 265 232   BMET  Recent Labs  04/28/17 0304 04/29/17 0440  NA 138 136  K 3.1* 2.9*  CL 101 101  CO2 28 27  GLUCOSE 111* 112*  BUN 14 8  CREATININE 0.81 0.75  CALCIUM 8.2* 8.0*   PT/INR No results for input(s): LABPROT, INR in the last 72 hours. ABG No results for input(s): PHART, HCO3 in the last 72 hours.  Invalid input(s): PCO2, PO2  Studies/Results: Ct Abdomen Pelvis W Contrast  Result Date: 04/27/2017 CLINICAL DATA:  Lower abdominal pain with nausea and vomiting EXAM: CT ABDOMEN AND PELVIS WITH CONTRAST TECHNIQUE: Multidetector CT imaging of the abdomen and pelvis was performed using the standard protocol following bolus administration of intravenous contrast. CONTRAST:  151mL ISOVUE-300 IOPAMIDOL (ISOVUE-300) INJECTION 61% COMPARISON:  None. FINDINGS: Lower chest: Lung bases are clear. Hepatobiliary: There is hepatic steatosis. Hepatic steatosis is somewhat more pronounced near the  fissure for the ligamentum teres than elsewhere. There is a cyst in the posterior aspect of the left lobe of the liver measuring 1.8 x 1.8 cm. No other focal liver lesion is evident. Gallbladder is absent. There is no appreciable biliary duct dilatation. Pancreas: There is no pancreatic mass or inflammatory focus. Spleen: No splenic lesions are evident. Adrenals/Urinary Tract: There is a cyst arising from the anterior, medial aspect of the mid left kidney measuring 1.1 x 1.0 cm. There is no appreciable hydronephrosis on either side. There is no renal or ureteral calculus on either side. Urinary bladder is midline with wall thickness within normal limits. Stomach/Bowel: There is wall thickening in the distal sigmoid colon with surrounding mesenteric stranding. There are several areas of microperforation in the area immediately surrounding this diverticulitis in the lower sigmoid colon. No abscess is seen in this area. Elsewhere, there are diverticula in the descending and proximal sigmoid colon regions without evident inflammatory change. No inflammation is seen elsewhere involving bowel. There is no bowel obstruction. No evident free air beyond the areas of microperforation in the lower pelvis. There is no portal venous air. Vascular/Lymphatic: There are foci of atherosclerotic calcification in the aorta and proximal common iliac arteries. No aneurysm evident. Major mesenteric vessels appear patent. There is no adenopathy in the abdomen or pelvis. Reproductive: Uterus is absent.  There is no pelvic mass. Other: Appendix appears normal. There is no abscess or ascites in the abdomen or pelvis. There is a minimal ventral hernia containing only fat. Musculoskeletal: There is degenerative change in the lumbar spine. There are no blastic or lytic bone lesions.  IMPRESSION: 1. Distal sigmoid diverticulitis. There are areas of microperforation in this area. No abscess evident. 2. There are multiple left-sided colonic  diverticula proximal to the diverticulitis without inflammation in these areas. 3. No bowel obstruction. No abscess in the abdomen pelvis. Appendix appears normal. 4.  No renal or ureteral calculus.  No hydronephrosis. 5.  Hepatic steatosis. 6.  Uterus and gallbladder absent. 7.  Aortoiliac atherosclerosis. 8.  Minimal ventral hernia containing only fat. Aortic Atherosclerosis (ICD10-I70.0). Critical Value/emergent results were called by telephone at the time of interpretation on 04/27/2017 at 3:22 pm to Dr. Mariea Clonts, ED physician, who verbally acknowledged these results. Electronically Signed   By: Lowella Grip III M.D.   On: 04/27/2017 15:26    Anti-infectives: Anti-infectives    Start     Dose/Rate Route Frequency Ordered Stop   04/27/17 2200  piperacillin-tazobactam (ZOSYN) IVPB 3.375 g     3.375 g 12.5 mL/hr over 240 Minutes Intravenous Every 8 hours 04/27/17 1654     04/27/17 1530  piperacillin-tazobactam (ZOSYN) IVPB 3.375 g     3.375 g 100 mL/hr over 30 Minutes Intravenous  Once 04/27/17 1529 04/27/17 1605      Assessment/Plan:  51 year old female with diverticulitis. Appears to be improving. Discussed with the patient once her tenderness is better that we would start her on a diet and the transition her to oral antibiotics. Encourage ambulation and incentive spirometer usage.  Loetta Connelley T. Adonis Huguenin, MD, [facility] General Surgeon [facility]  [facility] [phone] Night [facility] [phone] 04/29/2017

## 2017-04-30 LAB — CBC
HEMATOCRIT: 30.1 % — AB (ref 35.0–47.0)
Hemoglobin: 10.6 g/dL — ABNORMAL LOW (ref 12.0–16.0)
MCH: 28.7 pg (ref 26.0–34.0)
MCHC: 35 g/dL (ref 32.0–36.0)
MCV: 82 fL (ref 80.0–100.0)
Platelets: 218 10*3/uL (ref 150–440)
RBC: 3.67 MIL/uL — AB (ref 3.80–5.20)
RDW: 13.1 % (ref 11.5–14.5)
WBC: 7.7 10*3/uL (ref 3.6–11.0)

## 2017-04-30 MED ORDER — HYDROCHLOROTHIAZIDE 12.5 MG PO CAPS
12.5000 mg | ORAL_CAPSULE | Freq: Every day | ORAL | Status: DC
  Administered 2017-04-30: 12.5 mg via ORAL
  Filled 2017-04-30: qty 1

## 2017-04-30 MED ORDER — AMOXICILLIN-POT CLAVULANATE 875-125 MG PO TABS: 1.0000 | ORAL_TABLET | Freq: Two times a day (BID) | ORAL | 0 refills | Status: DC

## 2017-04-30 MED ORDER — AMOXICILLIN-POT CLAVULANATE 875-125 MG PO TABS
1.0000 | ORAL_TABLET | Freq: Two times a day (BID) | ORAL | Status: DC
  Administered 2017-04-30: 1 via ORAL
  Filled 2017-04-30: qty 1

## 2017-04-30 MED ORDER — LEFLUNOMIDE 20 MG PO TABS
20.0000 mg | ORAL_TABLET | Freq: Every day | ORAL | Status: DC
  Administered 2017-04-30: 20 mg via ORAL
  Filled 2017-04-30: qty 1

## 2017-04-30 MED ORDER — TOFACITINIB CITRATE ER 11 MG PO TB24: 1.0000 | ORAL_TABLET | Freq: Every day | ORAL | Status: DC

## 2017-04-30 MED ORDER — POTASSIUM CHLORIDE CRYS ER 10 MEQ PO TBCR
10.0000 meq | EXTENDED_RELEASE_TABLET | Freq: Two times a day (BID) | ORAL | Status: DC
  Administered 2017-04-30: 10 meq via ORAL
  Filled 2017-04-30: qty 1

## 2017-04-30 MED ORDER — MELOXICAM 7.5 MG PO TABS
15.0000 mg | ORAL_TABLET | Freq: Every day | ORAL | Status: DC
  Administered 2017-04-30: 15 mg via ORAL
  Filled 2017-04-30: qty 1

## 2017-04-30 NOTE — Discharge Summary (Signed)
Patient ID: Rebecca Obrien MRN: [id number] DOB/AGE: Dec 05, 1965 51 y.o.  Admit date: 04/27/2017 Discharge date: 04/30/2017  Discharge Diagnoses:  Diverticulitis with microperforation  Procedures Performed: None  Discharged Condition: good  Hospital Course: Patient admitted from ER with diverticulitis with microperforation. Patient's symptoms completely resolved on IV antibiotics. Tolerated transition to oral antibiotics without difficulty.  Discharge Orders: Discharge Instructions    Call MD for:  persistant nausea and vomiting    Complete by:  As directed    Call MD for:  severe uncontrolled pain    Complete by:  As directed    Call MD for:  temperature >100.4    Complete by:  As directed    Diet - low sodium heart healthy    Complete by:  As directed    Increase activity slowly    Complete by:  As directed       Disposition: 01-Home or Self Care  Discharge Medications: Allergies as of 04/30/2017   No Known Allergies     Medication List    TAKE these medications   amoxicillin-clavulanate 875-125 MG tablet Commonly known as:  AUGMENTIN Take 1 tablet by mouth every 12 (twelve) hours.   hydrochlorothiazide 12.5 MG capsule Commonly known as:  MICROZIDE Take 1 capsule by mouth daily.   leflunomide 20 MG tablet Commonly known as:  ARAVA Take 20 mg by mouth daily.   levothyroxine 125 MCG tablet Commonly known as:  SYNTHROID, LEVOTHROID Take 125 mcg by mouth daily before breakfast.   meloxicam 15 MG tablet Commonly known as:  MOBIC Take 1 tablet by mouth daily.   potassium chloride 10 MEQ tablet Commonly known as:  K-DUR,KLOR-CON Take 10 mEq by mouth 2 (two) times daily.   Tofacitinib Citrate 11 MG Tb24 Commonly known as:  XELJANZ XR Take 1 tablet by mouth daily.            Discharge Care Instructions        Start     Ordered   04/30/17 0000  amoxicillin-clavulanate (AUGMENTIN) 875-125 MG tablet  Every 12 hours     04/30/17 1444   04/30/17 0000   Increase activity slowly     04/30/17 1444   04/30/17 0000  Diet - low sodium heart healthy     04/30/17 1444   04/30/17 0000  Call MD for:  temperature >100.4     04/30/17 1444   04/30/17 0000  Call MD for:  persistant nausea and vomiting     04/30/17 1444   04/30/17 0000  Call MD for:  severe uncontrolled pain     04/30/17 1444       Follwup: Follow-up [place]. Schedule an appointment as soon as possible for a visit in 2 week(s).   Specialty:  General Surgery Why:  Clinic will call you on Monday to schedule your follow up. Contact information: [place] [address] [place] [phone]          Signed: Clayburn Pert 04/30/2017, 2:45 PM

## 2017-04-30 NOTE — Progress Notes (Signed)
Discharge order received. Patient is alert and oriented. Vital signs stable . No signs of acute distress. Discharge instructions given. Patient verbalized understanding. No other issues noted at this time.   

## 2017-04-30 NOTE — Discharge Instructions (Signed)

## 2017-04-30 NOTE — Progress Notes (Signed)
CC: Diverticulitis Subjective: Patient reports feeling much better this morning. Tolerated her clear liquid diet. Having bowel function.  Objective: Vital signs in last 24 hours: Temp:  [98.2 F (36.8 C)-99.3 F (37.4 C)] 98.2 F (36.8 C) (08/25 0644) Pulse Rate:  [75-91] 75 (08/25 0644) Resp:  [18-19] 18 (08/25 0644) BP: (115-135)/(70-84) 135/84 (08/25 0644) SpO2:  [97 %-99 %] 97 % (08/25 0644) Last BM Date: 04/29/17  Intake/Output from previous day: 08/24 0701 - 08/25 0700 In: 2667.8 [P.O.:240; I.V.:1993.8; IV Piggyback:434] Out: 0  Intake/Output this shift: No intake/output data recorded.  Physical exam:  Gen.: No acute distress  chest: Clear to auscultation  heart: Regular rhythm Abdomen: Soft and nontender  Lab Results: CBC   Recent Labs  04/29/17 0440 04/30/17 0522  WBC 10.3 7.7  HGB 11.3* 10.6*  HCT 32.3* 30.1*  PLT 232 218   BMET  Recent Labs  04/28/17 0304 04/29/17 0440  NA 138 136  K 3.1* 2.9*  CL 101 101  CO2 28 27  GLUCOSE 111* 112*  BUN 14 8  CREATININE 0.81 0.75  CALCIUM 8.2* 8.0*   PT/INR No results for input(s): LABPROT, INR in the last 72 hours. ABG No results for input(s): PHART, HCO3 in the last 72 hours.  Invalid input(s): PCO2, PO2  Studies/Results: No results found.  Anti-infectives: Anti-infectives    Start     Dose/Rate Route Frequency Ordered Stop   04/30/17 1000  amoxicillin-clavulanate (AUGMENTIN) 875-125 MG per tablet 1 tablet     1 tablet Oral Every 12 hours 04/30/17 0746     04/27/17 2200  piperacillin-tazobactam (ZOSYN) IVPB 3.375 g  Status:  Discontinued     3.375 g 12.5 mL/hr over 240 Minutes Intravenous Every 8 hours 04/27/17 1654 04/30/17 0746   04/27/17 1530  piperacillin-tazobactam (ZOSYN) IVPB 3.375 g     3.375 g 100 mL/hr over 30 Minutes Intravenous  Once 04/27/17 1529 04/27/17 1605      Assessment/Plan:  51 year old female admitted with diverticulitis with microperforation. Her labs normalized  and her pain is resided. Discussed with the patient that today we would advance her diet and start her on oral antibiotics. If she does well this morning she'll be safer discharge home later today. Encourage ambulation and incentive spirometer use.  Allon Costlow T. Adonis Huguenin, MD, [facility] General Surgeon [facility]  Day [facility] [phone] Night [facility] [phone] 04/30/2017

## 2017-05-02 ENCOUNTER — Telehealth: Payer: Self-pay | Admitting: General Surgery

## 2017-05-02 ENCOUNTER — Other Ambulatory Visit: Payer: Self-pay | Admitting: *Deleted

## 2017-05-02 DIAGNOSIS — T884XXA Failed or difficult intubation, initial encounter: Secondary | ICD-10-CM

## 2017-05-02 DIAGNOSIS — E876 Hypokalemia: Secondary | ICD-10-CM

## 2017-05-02 DIAGNOSIS — E0590 Thyrotoxicosis, unspecified without thyrotoxic crisis or storm: Secondary | ICD-10-CM

## 2017-05-02 DIAGNOSIS — Z720 Tobacco use: Secondary | ICD-10-CM | POA: Insufficient documentation

## 2017-05-02 NOTE — Telephone Encounter (Signed)
Patient stated she is not feeling well today. She has a low grade fever with chills. No appetite. When she has a bowel movement she has extreme pain and very nauseous. She has had three bowel movements today thus far.   Patient was added to 05/04/17 schedule with Dr.Pabon @ 2:15 pm.

## 2017-05-02 NOTE — Telephone Encounter (Signed)
Patient was d/c from hospital on Saturday. Still not feeling well. Pain is persistent and when using the restroom the pain is even worse. Patient has no appetite. No fever but doesn't feel Improved at all. Pls advise.

## 2017-05-02 NOTE — Patient Outreach (Signed)
Rebecca Obrien [facility]) Care Management  05/02/2017  Rebecca Obrien [birth year] [id number]  Subjective: Telephone call to patient's home  / mobile number, line busy, and unable to leave a message.   Objective: Per chart review, patient hospitalized 04/27/17 -04/30/17 for Diverticulitis with microperforation.   Patient also has a history of Rheumatoid arthritis, hypertension, hyperlipidemia, skin melanoma and stroke.    Assessment: Received UMR Transition of care referral on 04/28/17.  Transition of care follow up pending patient contact.     Plan: RNCM will call patient for 2nd telephone outreach attempt, transition of care follow up, within 10 business days if no return call.    Rebecca Obrien H. [name], BSN, [place] Management [facility] Telephonic CM Phone: [phone] Fax: [phone]

## 2017-05-03 ENCOUNTER — Ambulatory Visit (INDEPENDENT_AMBULATORY_CARE_PROVIDER_SITE_OTHER): Payer: 59 | Admitting: Surgery

## 2017-05-03 ENCOUNTER — Other Ambulatory Visit: Payer: Self-pay | Admitting: *Deleted

## 2017-05-03 ENCOUNTER — Inpatient Hospital Stay
Admission: RE | Admit: 2017-05-03 | Discharge: 2017-05-07 | DRG: 392 | Disposition: A | Discharge status: Home Health | Payer: 59 | Source: Ambulatory Visit | Attending: Surgery | Admitting: Surgery

## 2017-05-03 ENCOUNTER — Ambulatory Visit
Admission: RE | Admit: 2017-05-03 | Discharge: 2017-05-03 | Disposition: A | Discharge status: Home / Self Care | Payer: 59 | Source: Ambulatory Visit | Attending: Surgery | Admitting: Surgery

## 2017-05-03 ENCOUNTER — Other Ambulatory Visit
Admission: RE | Admit: 2017-05-03 | Discharge: 2017-05-03 | Disposition: A | Discharge status: Home / Self Care | Payer: 59 | Source: Ambulatory Visit | Attending: Surgery | Admitting: Surgery

## 2017-05-03 ENCOUNTER — Encounter: Payer: Self-pay | Admitting: Surgery

## 2017-05-03 DIAGNOSIS — K5720 Diverticulitis of large intestine with perforation and abscess without bleeding: Principal | ICD-10-CM | POA: Diagnosis present

## 2017-05-03 DIAGNOSIS — K5732 Diverticulitis of large intestine without perforation or abscess without bleeding: Secondary | ICD-10-CM | POA: Insufficient documentation

## 2017-05-03 DIAGNOSIS — E0590 Thyrotoxicosis, unspecified without thyrotoxic crisis or storm: Secondary | ICD-10-CM | POA: Diagnosis present

## 2017-05-03 DIAGNOSIS — I10 Essential (primary) hypertension: Secondary | ICD-10-CM | POA: Diagnosis present

## 2017-05-03 DIAGNOSIS — Z8249 Family history of ischemic heart disease and other diseases of the circulatory system: Secondary | ICD-10-CM

## 2017-05-03 DIAGNOSIS — Z90710 Acquired absence of both cervix and uterus: Secondary | ICD-10-CM | POA: Diagnosis not present

## 2017-05-03 DIAGNOSIS — R1032 Left lower quadrant pain: Secondary | ICD-10-CM | POA: Diagnosis not present

## 2017-05-03 DIAGNOSIS — K5780 Diverticulitis of intestine, part unspecified, with perforation and abscess without bleeding: Secondary | ICD-10-CM | POA: Diagnosis present

## 2017-05-03 DIAGNOSIS — E785 Hyperlipidemia, unspecified: Secondary | ICD-10-CM | POA: Diagnosis present

## 2017-05-03 DIAGNOSIS — Z8673 Personal history of transient ischemic attack (TIA), and cerebral infarction without residual deficits: Secondary | ICD-10-CM

## 2017-05-03 DIAGNOSIS — K651 Peritoneal abscess: Secondary | ICD-10-CM | POA: Diagnosis not present

## 2017-05-03 DIAGNOSIS — Z85820 Personal history of malignant melanoma of skin: Secondary | ICD-10-CM

## 2017-05-03 DIAGNOSIS — M069 Rheumatoid arthritis, unspecified: Secondary | ICD-10-CM | POA: Diagnosis present

## 2017-05-03 DIAGNOSIS — R197 Diarrhea, unspecified: Secondary | ICD-10-CM | POA: Diagnosis not present

## 2017-05-03 DIAGNOSIS — K5781 Diverticulitis of intestine, part unspecified, with perforation and abscess with bleeding: Secondary | ICD-10-CM | POA: Diagnosis not present

## 2017-05-03 HISTORY — DX: Systemic involvement of connective tissue, unspecified: M35.9

## 2017-05-03 LAB — CBC WITH DIFFERENTIAL/PLATELET
Basophils Absolute: 0.1 10*3/uL (ref 0–0.1)
Basophils Relative: 1 %
EOS ABS: 0 10*3/uL (ref 0–0.7)
EOS PCT: 0 %
HCT: 38.2 % (ref 35.0–47.0)
Hemoglobin: 13.1 g/dL (ref 12.0–16.0)
LYMPHS ABS: 1.9 10*3/uL (ref 1.0–3.6)
Lymphocytes Relative: 17 %
MCH: 28.7 pg (ref 26.0–34.0)
MCHC: 34.3 g/dL (ref 32.0–36.0)
MCV: 83.5 fL (ref 80.0–100.0)
MONOS PCT: 6 %
Monocytes Absolute: 0.7 10*3/uL (ref 0.2–0.9)
Neutro Abs: 8.2 10*3/uL — ABNORMAL HIGH (ref 1.4–6.5)
Neutrophils Relative %: 76 %
PLATELETS: 429 10*3/uL (ref 150–440)
RBC: 4.57 MIL/uL (ref 3.80–5.20)
RDW: 13.4 % (ref 11.5–14.5)
WBC: 10.9 10*3/uL (ref 3.6–11.0)

## 2017-05-03 LAB — COMPREHENSIVE METABOLIC PANEL
ALT: 18 U/L (ref 14–54)
ANION GAP: 12 (ref 5–15)
AST: 25 U/L (ref 15–41)
Albumin: 3.5 g/dL (ref 3.5–5.0)
Alkaline Phosphatase: 77 U/L (ref 38–126)
BUN: 12 mg/dL (ref 6–20)
CHLORIDE: 101 mmol/L (ref 101–111)
CO2: 27 mmol/L (ref 22–32)
Calcium: 9 mg/dL (ref 8.9–10.3)
Creatinine, Ser: 0.63 mg/dL (ref 0.44–1.00)
GFR calc non Af Amer: >60 mL/min (ref >60)
Glucose, Bld: 89 mg/dL (ref 65–99)
Potassium: 3.5 mmol/L (ref 3.5–5.1)
SODIUM: 140 mmol/L (ref 135–145)
Total Bilirubin: 0.6 mg/dL (ref 0.3–1.2)
Total Protein: 7.6 g/dL (ref 6.5–8.1)

## 2017-05-03 MED ORDER — ONDANSETRON 4 MG PO TBDP: 4.0000 mg | ORAL_TABLET | Freq: Four times a day (QID) | ORAL | Status: DC | PRN

## 2017-05-03 MED ORDER — KCL IN DEXTROSE-NACL 20-5-0.45 MEQ/L-%-% IV SOLN
INTRAVENOUS | Status: DC
  Administered 2017-05-03 – 2017-05-05 (×4): via INTRAVENOUS
  Filled 2017-05-03 (×7): qty 1000

## 2017-05-03 MED ORDER — MORPHINE SULFATE (PF) 2 MG/ML IV SOLN
2.0000 mg | INTRAVENOUS | Status: DC | PRN
  Administered 2017-05-04 – 2017-05-05 (×4): 2 mg via INTRAVENOUS
  Filled 2017-05-03 (×4): qty 1

## 2017-05-03 MED ORDER — ONDANSETRON HCL 4 MG/2ML IJ SOLN
INTRAMUSCULAR | Status: AC
  Filled 2017-05-03: qty 2

## 2017-05-03 MED ORDER — ONDANSETRON HCL 4 MG/2ML IJ SOLN: 4.0000 mg | Freq: Four times a day (QID) | INTRAMUSCULAR | Status: DC | PRN

## 2017-05-03 MED ORDER — IOPAMIDOL (ISOVUE-300) INJECTION 61%
100.0000 mL | Freq: Once | INTRAVENOUS | Status: AC | PRN
  Administered 2017-05-03: 100 mL via INTRAVENOUS

## 2017-05-03 MED ORDER — KETOROLAC TROMETHAMINE 30 MG/ML IJ SOLN
30.0000 mg | Freq: Once | INTRAMUSCULAR | Status: AC
  Administered 2017-05-03: 30 mg via INTRAVENOUS
  Filled 2017-05-03: qty 1

## 2017-05-03 MED ORDER — PIPERACILLIN-TAZOBACTAM 3.375 G IVPB
3.3750 g | Freq: Three times a day (TID) | INTRAVENOUS | Status: DC
  Administered 2017-05-03 – 2017-05-06 (×8): 3.375 g via INTRAVENOUS
  Filled 2017-05-03 (×7): qty 50

## 2017-05-03 MED ORDER — ONDANSETRON HCL 4 MG/2ML IJ SOLN
4.0000 mg | Freq: Four times a day (QID) | INTRAMUSCULAR | Status: DC | PRN
  Filled 2017-05-03: qty 2

## 2017-05-03 MED ORDER — ENOXAPARIN SODIUM 40 MG/0.4ML ~~LOC~~ SOLN
40.0000 mg | SUBCUTANEOUS | Status: DC
  Administered 2017-05-06 – 2017-05-07 (×2): 40 mg via SUBCUTANEOUS
  Filled 2017-05-03 (×2): qty 0.4

## 2017-05-03 MED ORDER — PIPERACILLIN-TAZOBACTAM 3.375 G IVPB
3.3750 g | Freq: Three times a day (TID) | INTRAVENOUS | Status: DC
  Filled 2017-05-03: qty 50

## 2017-05-03 NOTE — Patient Outreach (Signed)
[facility]) Care Management  05/03/2017  THERISA MENNELLA [birth year] [id number]   Subjective: Telephone call to patient's home  / mobile number, line busy, and unable to leave a message.   Objective: Per chart review, patient hospitalized 04/27/17 -04/30/17 for Diverticulitis with microperforation.   Patient also has a history of Rheumatoid arthritis, hypertension, hyperlipidemia, skin melanoma and stroke.    Assessment: Received UMR Transition of care referral on 04/28/17. Transition of care follow up pending patient contact.     Plan: RNCM will call patient for 3rd telephone outreach attempt, transition of care follow up, within 10 business days if no return call.    Suri Tafolla H. [name], BSN, [place] Management [facility] Telephonic CM Phone: [phone] Fax: [phone]

## 2017-05-03 NOTE — H&P (Signed)
SURGICAL HISTORY & PHYSICAL (cpt [phone])  HISTORY OF PRESENT ILLNESS (HPI):  51 y.o. female recently discharged 4 days ago from [facility], where she was treated for uncomplicated acute sigmoid colonic diverticulitis and seen for outpatient surgical follow-up earlier today, at which time she was ordered a follow-up outpatient CT of her abdomen and pelvis for worsening of LLQ abdominal pain with BM's in particular. CT was performed, and patient is currently in CT waiting area. She otherwise reports +flatus and non-bloody BM's with subjective chills, but denies fever, N/V, CP, or SOB.   PAST MEDICAL HISTORY (PMH):  Past Medical History:  Diagnosis Date  . Arthritis    RA  . Cancer ([place])    skin melanoma  . Collagen vascular disease (HCC)    RA  . Difficult airway   . Hyperlipidemia   . Hypertension   . Hyperthyroidism   . Hypokalemia   . Obesity   . Rheumatoid arthritis(714.0)   . Stroke [facility])     Reviewed. Otherwise negative.   PAST SURGICAL HISTORY ([place]):  Past Surgical History:  Procedure Laterality Date  . ABDOMINAL HYSTERECTOMY    . CARPAL TUNNEL RELEASE  Bilateral  . CHOLECYSTECTOMY    . TEE WITHOUT CARDIOVERSION  07/26/2012   Procedure: TRANSESOPHAGEAL ECHOCARDIOGRAM (TEE);  Surgeon: Thayer Headings, MD;  Location: [place];  Service: Cardiovascular;  Laterality: N/A;    Reviewed. Otherwise negative.   MEDICATIONS:  Prior to Admission medications   Medication Sig Start Date End Date Taking? Authorizing Provider  amoxicillin-clavulanate (AUGMENTIN) 875-125 MG tablet Take 1 tablet by mouth every 12 (twelve) hours. 04/30/17   Clayburn Pert, MD  hydrochlorothiazide (MICROZIDE) 12.5 MG capsule Take 1 capsule by mouth daily. 04/20/17   [provider]  leflunomide (ARAVA) 20 MG tablet Take 1 tablet by mouth daily. 04/08/17 10/05/17  [provider]  meloxicam (MOBIC) 15 MG tablet Take 1 tablet by mouth daily. 02/04/17   [provider]   oxyCODONE-acetaminophen (PERCOCET/ROXICET) 5-325 MG tablet Take 1 tablet by mouth every 4 (four) hours as needed for severe pain.    [provider]  potassium chloride (K-DUR) 10 MEQ tablet Take 2 tablets by mouth 2 (two) times daily. 03/02/17 03/02/18  [provider]  potassium chloride (K-DUR,KLOR-CON) 10 MEQ tablet Take 10 mEq by mouth 2 (two) times daily.    [provider]  predniSONE (DELTASONE) 5 MG tablet Take 4 tablets by mouth daily at 8 pm. 01/26/17   [provider]  Tofacitinib Citrate 11 MG TB24 Take 11 mg by mouth daily. 02/03/17 08/02/17  [provider]     ALLERGIES:  Allergies  Allergen Reactions  . Latex Dermatitis    With prolonged use      SOCIAL HISTORY:  Social History   Social History  . Marital status: Married    Spouse name: N/A  . Number of children: N/A  . Years of education: N/A   Occupational History  . Not on file.   Social History Main Topics  . Smoking status: Current Every Day Smoker    Packs/day: 1.00    Years: 23.00    Types: Cigarettes    Last attempt to quit: 07/23/2012  . Smokeless tobacco: Never Used  . Alcohol use No  . Drug use: No  . Sexual activity: Not on file   Other Topics Concern  . Not on file   Social History Narrative  . No narrative on file    The patient currently resides (home /  rehab facility / nursing home): Home  The patient normally is (ambulatory / bedbound) : Ambulatory   FAMILY HISTORY:  Family History  Problem Relation Age of Onset  . Nephrolithiasis Mother   . Hypertension Mother   . Hypertension Father   . Heart failure Father   . Cardiomyopathy Brother     Otherwise negative.   REVIEW OF SYSTEMS:  Constitutional: denies any other weight loss, fever, chills, or sweats  Eyes: denies any other vision changes, history of eye injury  ENT: denies sore throat, hearing problems  Respiratory: denies shortness of breath, wheezing  Cardiovascular: denies  chest pain, palpitations  Gastrointestinal: abdominal pain, N/V, and bowel function as per HPI  Genitourinary: denies burning with urination or urinary frequency Musculoskeletal: denies any other joint pains or cramps  Skin: Denies any other rashes or skin discolorations  Neurological: denies any other headache, dizziness, weakness  Psychiatric: denies any other depression, anxiety   All other review of systems were otherwise negative.  VITAL SIGNS:  Temp:  [98.2 F (36.8 C)] 98.2 F (36.8 C) (08/28 2151) Pulse Rate:  [90-98] 90 (08/28 2151) BP: (143-147)/(83-89) 147/89 (08/28 2151) SpO2:  [98 %] 98 % (08/28 2151) Weight:  [166 lb 6.4 oz (75.5 kg)] 166 lb 6.4 oz (75.5 kg) (08/28 1432)             INTAKE/OUTPUT:  This shift: No intake/output data recorded.  Last 2 shifts: @IOLAST2SHIFTS @  PHYSICAL EXAM:  Constitutional:  -- Normal body habitus  -- Awake, alert, and oriented x3  Eyes:  -- Pupils equally round and reactive to light  -- No scleral icterus  Ear, nose, throat:  -- No jugular venous distension  Pulmonary:  -- No crackles  -- Equal breath sounds bilaterally -- Breathing non-labored at rest Cardiovascular:  -- S1, S2 present  -- No pericardial rubs  Gastrointestinal:  -- Abdomen soft AND non-distended with mild-/moderate- LLQ abdominal tenderness to palpation, no guarding/rebound  -- No abdominal masses appreciated, pulsatile or otherwise  Musculoskeletal and Integumentary:  -- Wounds or skin discoloration: None appreciated -- Extremities: B/L UE and LE FROM, hands and feet warm, no edema  Neurologic:  -- Motor function: Intact and symmetric -- Sensation: Intact and symmetric  Labs:  CBC Latest Ref Rng & Units 05/03/2017 04/30/2017 04/29/2017  WBC 3.6 - 11.0 K/uL 10.9 7.7 10.3  Hemoglobin 12.0 - 16.0 g/dL 13.1 10.6(L) 11.3(L)  Hematocrit 35.0 - 47.0 % 38.2 30.1(L) 32.3(L)  Platelets 150 - 440 K/uL 429 218 232   CMP Latest Ref Rng & Units 05/03/2017  04/29/2017 04/28/2017  Glucose 65 - 99 mg/dL 89 112(H) 111(H)  BUN 6 - 20 mg/dL 12 8 14   Creatinine 0.44 - 1.00 mg/dL 0.63 0.75 0.81  Sodium 135 - 145 mmol/L 140 136 138  Potassium 3.5 - 5.1 mmol/L 3.5 2.9(L) 3.1(L)  Chloride 101 - 111 mmol/L 101 101 101  CO2 22 - 32 mmol/L 27 27 28   Calcium 8.9 - 10.3 mg/dL 9.0 8.0(L) 8.2(L)  Total Protein 6.5 - 8.1 g/dL 7.6 - 6.6  Total Bilirubin 0.3 - 1.2 mg/dL 0.6 - 1.1  Alkaline Phos 38 - 126 U/L 77 - 60  AST 15 - 41 U/L 25 - 19  ALT 14 - 54 U/L 18 - 19   Imaging studies:  CT Abdomen and Pelvis with Contrast (05/03/2017) - personally reviewed and discussed with patient and her mother (bedside) There is diverticulitis of the sigmoid colon with thickened sigmoid colonic wall. The previously  noted several areas of micro perforation surrounding the diverticulitis is present and increased, there is a 2.6 x 4.1 cm abscess formation at this site.   Assessment/Plan: (ICD-10's: K85.20) 51 y.o. female with subacute diverticulitis and interval development of associated peri-colonic abscess, complicated by pertinent comorbidities including HTN, HLD, hyperthyroidism, melanoma, history of stroke, and rheumatoid arthritis.    - IV antibiotics  - pain control prn  - admit to surgical service  - clear liquids with NPO after MN   - IR evaluation for percutaneous drainage   - DVT prophylaxis  All of the above findings and recommendations were discussed with the patient and her mother (present bedside), and all of her and family's questions were answered to their expressed satisfaction.  -- Marilynne Drivers Rosana Hoes, MD, [place]: [facility] - Partnering for exceptional care. Office: [phone]

## 2017-05-03 NOTE — Progress Notes (Signed)
Surgical Consultation  05/03/2017  Rebecca Obrien is an 51 y.o. female.   Chief Complaint  Patient presents with  . Hospitalization Follow-up    Diverticulitis     HPI: 51 yo female following up for diverticulitis. CT scan personally reviewed and there is evidence of microperforation. No free air and no abscess. She was recently admitted and treated with IV antibiotics and was discharged 4 days ago. She states that she did well for 1-2 days and then back on Monday started having significant abdominal pain. She reports that the pain gets worse when she has a bowel movement. And the pain is severe when she has a bowel movement and is crampy type. Pain is located to the left lower quadrant. No fevers or chills. She does have decreased appetite  Past Medical History:  Diagnosis Date  . Arthritis    RA  . Cancer ([place])    skin melanoma  . Difficult airway   . Hyperlipidemia   . Hypertension   . Hyperthyroidism   . Hypokalemia   . Obesity   . Rheumatoid arthritis(714.0)   . Stroke [facility])     Past Surgical History:  Procedure Laterality Date  . ABDOMINAL HYSTERECTOMY    . CARPAL TUNNEL RELEASE  Bilateral  . CHOLECYSTECTOMY    . TEE WITHOUT CARDIOVERSION  07/26/2012   Procedure: TRANSESOPHAGEAL ECHOCARDIOGRAM (TEE);  Surgeon: Thayer Headings, MD;  Location: [facility] ENDOSCOPY;  Service: Cardiovascular;  Laterality: N/A;    Family History  Problem Relation Age of Onset  . Nephrolithiasis Mother   . Hypertension Mother   . Hypertension Father   . Heart failure Father   . Cardiomyopathy Brother     Social History:  reports that she has been smoking Cigarettes.  She has a 23.00 pack-year smoking history. She has never used smokeless tobacco. She reports that she does not drink alcohol or use drugs.  Allergies:  Allergies  Allergen Reactions  . Latex Dermatitis    With prolonged use     Medications reviewed.     ROS Full ROS performed and is otherwise negative other than  what is stated in the HPI    BP (!) 143/83   Pulse 98   Temp 98.2 F (36.8 C) (Oral)   Ht 5\' 4"  (1.626 m)   Wt 75.5 kg (166 lb 6.4 oz)   BMI 28.56 kg/m   Physical Exam  Constitutional: She is oriented to person, place, and time and well-developed, well-nourished, and in no distress. No distress.  Neck: No JVD present.  Pulmonary/Chest: Effort normal. No stridor. No respiratory distress.  Abdominal: Soft. She exhibits no distension. There is tenderness. There is no rebound and no guarding.  Musculoskeletal: Normal range of motion. She exhibits no edema.  Neurological: She is alert and oriented to person, place, and time. Gait normal. GCS score is 15.  Skin: Skin is warm and dry. She is not diaphoretic.  Psychiatric: Mood, memory, affect and judgment normal.  Nursing note and vitals reviewed.   Assessment/Plan:  Diverticulitis micro perf now with worsening symptoms. Discussed with the patient in detail about admitting her to the hospital to further do a workup with a CT scan of the abdomen and pelvis versus doing this as an outpatient today. She states that she wishes to have the workup as an outpatient and obviously if there is anything significant  she will come to the hospital tonight. Currently no need for emergent surgical retention. She is not toxic or peritoneally  This time. D/W Dr. Burt Knack who is on call tonight. Depending on CT findings may need re admission. She understands. She also understands about the possibility that she may require A Hartmann's if her disease does not improve. I will obtain CBC and CMP as well  Caroleen Hamman, MD [place] Surgeon

## 2017-05-03 NOTE — Patient Instructions (Addendum)
We have scheduled for you to have a CT Scan and lab work today at the [facility]. You will have to wait at least 2 hours before having the CT Scan due to having to drink the contrast solution. You may not leave after drinking the solution. Radiology will hold you until results are in.    Directions to Medical Mall: When leaving our office, go right. Go all of the way down to the very end of the hallway. You will have a purple wall in front of you. You will now have a tunnel to the hospital on your left hand side. Go through this tunnel and the elevators will be on your left. Go down to the 1st floor and take a slight left. The very first desk on the right hand side is the registration desk.   We will follow up with you after the result are in. However our on call doctor will decide if you need to be admitted or not.

## 2017-05-04 ENCOUNTER — Other Ambulatory Visit: Payer: Self-pay | Admitting: *Deleted

## 2017-05-04 ENCOUNTER — Inpatient Hospital Stay: Payer: 59

## 2017-05-04 MED ORDER — FENTANYL CITRATE (PF) 100 MCG/2ML IJ SOLN
INTRAMUSCULAR | Status: AC
  Filled 2017-05-04: qty 4

## 2017-05-04 MED ORDER — BUPIVACAINE HCL (PF) 0.25 % IJ SOLN
INTRAMUSCULAR | Status: AC
  Filled 2017-05-04: qty 30

## 2017-05-04 MED ORDER — MIDAZOLAM HCL 5 MG/5ML IJ SOLN
INTRAMUSCULAR | Status: AC
  Filled 2017-05-04: qty 5

## 2017-05-04 NOTE — Progress Notes (Signed)
CC: Lower abdominal pain Subjective: This patient with lower abdominal pain who is admitted through CT last night after a follow-up CT scan demonstrated an abscess likely secondary to perforated diverticulitis. She's been hospital last week was discharged on oral antibiotics been seen in the office by Dr. Dahlia Byes. An outpatient CT scan demonstrated an abscess and she was admitted the hospital for probable drainage as well as IV antibiotics. Today she states her pain is slightly better but still present. She has no nausea vomiting no fevers or chills  Objective: Vital signs in last 24 hours: Temp:  [97.2 F (36.2 C)-98.8 F (37.1 C)] 98.8 F (37.1 C) (08/29 0735) Pulse Rate:  [75-98] 77 (08/29 0735) Resp:  [18] 18 (08/29 0735) BP: (129-147)/(68-89) 133/70 (08/29 0735) SpO2:  [97 %-98 %] 98 % (08/29 0735) Weight:  [166 lb 6.4 oz (75.5 kg)-167 lb 6.4 oz (75.9 kg)] 167 lb 6.4 oz (75.9 kg) (08/28 2248)    Intake/Output from previous day: 08/28 0701 - 08/29 0700 In: 763 [I.V.:663; IV Piggyback:100] Out: 850 [Urine:850] Intake/Output this shift: No intake/output data recorded.  Physical exam:  Afebrile vital signs are stable patient appears comfortable.  Abdomen is soft but tender in the left lower quadrant with some guarding and percussion tenderness but no rebound tenderness. Nontender no icterus no jaundice  Lab Results: CBC   Recent Labs  05/03/17 1538  WBC 10.9  HGB 13.1  HCT 38.2  PLT 429   BMET  Recent Labs  05/03/17 1538  NA 140  K 3.5  CL 101  CO2 27  GLUCOSE 89  BUN 12  CREATININE 0.63  CALCIUM 9.0   PT/INR No results for input(s): LABPROT, INR in the last 72 hours. ABG No results for input(s): PHART, HCO3 in the last 72 hours.  Invalid input(s): PCO2, PO2  Studies/Results: Ct Abdomen Pelvis W Contrast  Result Date: 05/03/2017 CLINICAL DATA:  Left lower quadrant abdomen pain with nausea and diarrhea EXAM: CT ABDOMEN AND PELVIS WITH CONTRAST TECHNIQUE:  Multidetector CT imaging of the abdomen and pelvis was performed using the standard protocol following bolus administration of intravenous contrast. CONTRAST:  181mL ISOVUE-300 IOPAMIDOL (ISOVUE-300) INJECTION 61% COMPARISON:  April 27, 2017 FINDINGS: Lower chest: No acute abnormality. Hepatobiliary: There is diffuse low density of the liver. There is a cyst in the left lobe of liver measuring 1.8 cm unchanged. The liver is otherwise normal. Patient status post prior cholecystectomy. No biliary ductal dilatation. Pancreas: Unremarkable. No pancreatic ductal dilatation or surrounding inflammatory changes. Spleen: Normal in size without focal abnormality. Adrenals/Urinary Tract: Adrenal glands are unremarkable. Kidneys are without renal calculi, or hydronephrosis. There is a 1.1 cm cyst in the anterior midpole left kidney. Bladder is unremarkable. Stomach/Bowel: There is diverticulitis of the sigmoid colon with thickened sigmoid colonic wall. The previously noted several areas of micro perforation surrounding the diverticulitis is present and increased, there is a 2.6 x 4.1 cm abscess formation at this site. There is no colonic obstruction. The small bowel is normal. The appendix is normal. Vascular/Lymphatic: Aortic atherosclerosis. No enlarged abdominal or pelvic lymph nodes. Reproductive: Status post hysterectomy. No adnexal masses. Other: No free air is identified in the abdomen. Minimal fat containing ventral hernia is identified unchanged. Musculoskeletal: Degenerative joint changes of the spine are noted. Woke IMPRESSION: There is diverticulitis of the sigmoid colon with thickened sigmoid colonic wall. The previously noted several areas of micro perforation surrounding the diverticulitis is present and increased, there is a 2.6 x 4.1 cm abscess  formation at this site. Electronically Signed   By: Abelardo Diesel M.D.   On: 05/03/2017 18:22    Anti-infectives: Anti-infectives    Start     Dose/Rate Route  Frequency Ordered Stop   05/03/17 2245  piperacillin-tazobactam (ZOSYN) IVPB 3.375 g     3.375 g 12.5 mL/hr over 240 Minutes Intravenous Every 8 hours 05/03/17 2235     05/03/17 2115  piperacillin-tazobactam (ZOSYN) IVPB 3.375 g  Status:  Discontinued     3.375 g 12.5 mL/hr over 240 Minutes Intravenous Every 8 hours 05/03/17 2103 05/03/17 2236      Assessment/Plan:  CT is been personally reviewed. She is on Zosyn. Agree with drainage via CT guidance if possible. If this is not possible then surgical intervention may be needed depending on her progress on IV antibiotics. This is discussed with her and she understands and agrees with this plan. We'll await opinion from interventional radiology concerning efficacy of drainage procedure.  Florene Glen, MD, FACS  05/04/2017

## 2017-05-04 NOTE — Procedures (Signed)
CT Aspiration of sigmoid colon abscess.   Collection too small for drain placement  Complications:  None  Blood Loss: none  See dictation in canopy pacs

## 2017-05-04 NOTE — Patient Outreach (Signed)
[facility]) Care Management  05/04/2017  Rebecca Obrien 08-02-1966 [id number]   Subjective:  No outreach to patient, currently inpatient.    Objective: Per chart review, Patient admitted on 05/03/17 for peri-colonic abscess, and remains inpatient.  Patient hospitalized 04/27/17 -04/30/17 for Diverticulitis with microperforation. Patient also has a history of Rheumatoid arthritis, hypertension, hyperlipidemia, skin melanomaand stroke.    Assessment:Received UMR Transition of care referral on 04/28/17. Transition of care follow up pending patient contact.     Plan: RNCM will call patient for  telephone outreach attempt, transition of care follow up, within 3 business days of hospital discharge notification.    Keishawn Rajewski H. [name], BSN, [place] Management [facility] Telephonic CM Phone: [phone] Fax: [phone]

## 2017-05-04 NOTE — Progress Notes (Signed)
Patient feels better this afternoon. She is hungry. Results of drainage procedure was reviewed.  Plan to advance diet and continue IV antibiotics.

## 2017-05-04 NOTE — Progress Notes (Signed)
Patient off floor to procedure.

## 2017-05-05 LAB — CBC
HCT: 33.1 % — ABNORMAL LOW (ref 35.0–47.0)
Hemoglobin: 11.5 g/dL — ABNORMAL LOW (ref 12.0–16.0)
MCH: 29.2 pg (ref 26.0–34.0)
MCHC: 34.7 g/dL (ref 32.0–36.0)
MCV: 84.4 fL (ref 80.0–100.0)
PLATELETS: 340 10*3/uL (ref 150–440)
RBC: 3.92 MIL/uL (ref 3.80–5.20)
RDW: 13.3 % (ref 11.5–14.5)
WBC: 5.4 10*3/uL (ref 3.6–11.0)

## 2017-05-05 LAB — BASIC METABOLIC PANEL
Anion gap: 6 (ref 5–15)
BUN: 10 mg/dL (ref 6–20)
CHLORIDE: 107 mmol/L (ref 101–111)
CO2: 28 mmol/L (ref 22–32)
CREATININE: 0.53 mg/dL (ref 0.44–1.00)
Calcium: 8.2 mg/dL — ABNORMAL LOW (ref 8.9–10.3)
Glucose, Bld: 104 mg/dL — ABNORMAL HIGH (ref 65–99)
POTASSIUM: 3.6 mmol/L (ref 3.5–5.1)
SODIUM: 141 mmol/L (ref 135–145)

## 2017-05-05 NOTE — Progress Notes (Signed)
CC: Diverticulitis Subjective: Patient status post aspiration of an abscess cavity with acute diverticulitis. She feels much better today no nausea vomiting no fevers or chills and her pain is much improved. She is hungry.  Objective: Vital signs in last 24 hours: Temp:  [97.6 F (36.4 C)-98.3 F (36.8 C)] 98.3 F (36.8 C) (08/30 0800) Pulse Rate:  [55-86] 60 (08/30 0800) Resp:  [12-24] 16 (08/30 0800) BP: (126-152)/(67-100) 133/77 (08/30 0800) SpO2:  [93 %-100 %] 99 % (08/30 0800) Last BM Date: 05/03/17  Intake/Output from previous day: 08/29 0701 - 08/30 0700 In: 2452 [P.O.:240; I.V.:2112; IV Piggyback:100] Out: 450 [Urine:450] Intake/Output this shift: No intake/output data recorded.  Physical exam:  Awake alert and oriented vital signs are stable and afebrile abdomen is soft nondistended nontympanitic and minimally tender in the left lower quadrant without peritoneal signs nontender calves no icterus no jaundice  Lab Results: CBC   Recent Labs  05/03/17 1538 05/05/17 0512  WBC 10.9 5.4  HGB 13.1 11.5*  HCT 38.2 33.1*  PLT 429 340   BMET  Recent Labs  05/03/17 1538 05/05/17 0512  NA 140 141  K 3.5 3.6  CL 101 107  CO2 27 28  GLUCOSE 89 104*  BUN 12 10  CREATININE 0.63 0.53  CALCIUM 9.0 8.2*   PT/INR No results for input(s): LABPROT, INR in the last 72 hours. ABG No results for input(s): PHART, HCO3 in the last 72 hours.  Invalid input(s): PCO2, PO2  Studies/Results: Ct Guided Needle Placement  Result Date: 05/04/2017 INDICATION: Diverticular abscess EXAM: CT GUIDED ASPIRATION OF PERICOLONIC ABSCESS MEDICATIONS: The patient is currently admitted to the hospital and receiving intravenous antibiotics. The antibiotics were administered within an appropriate time frame prior to the initiation of the procedure. ANESTHESIA/SEDATION: None COMPLICATIONS: None immediate. TECHNIQUE: Informed written consent was obtained from the patient after a thorough  discussion of the procedural risks, benefits and alternatives. All questions were addressed. Maximal Sterile Barrier Technique was utilized including caps, mask, sterile gowns, sterile gloves, sterile drape, hand hygiene and skin antiseptic. A timeout was performed prior to the initiation of the procedure. PROCEDURE: The operative field was prepped with Chlorhexidine in a sterile fashion, and a sterile drape was applied covering the operative field. A sterile gown and sterile gloves were used for the procedure. Local anesthesia was provided with 0.25% Marcaine. Initial CT evaluation demonstrates near complete evacuation of the fluid from the abscess cavity. It is predominately air-filled at this time and has decreased in size somewhat from the recent exam. An 18 gauge trocar needle was placed percutaneously into the collection and aspiration was performed with significant reduction in the amount of air within the cavity. Additionally a small amount of purulent fluid was aspirated without difficulty. The cavity was too small to allow for percutaneous drain placement. The fluid was sent for laboratory evaluation. Followup scanning shows near complete collapse of the cavity. Patient tolerated procedure well was returned her room in satisfactory condition. FINDINGS: As described above IMPRESSION: Reduction in size of the known pericolonic abscess with almost complete resolution of the fluid within. A small amount of purulent fluid was withdrawn from the abscess cavity. The size was too small to allow for more adequate drain placement. Electronically Signed   By: Inez Catalina M.D.   On: 05/04/2017 13:18   Ct Abdomen Pelvis W Contrast  Result Date: 05/03/2017 CLINICAL DATA:  Left lower quadrant abdomen pain with nausea and diarrhea EXAM: CT ABDOMEN AND PELVIS WITH CONTRAST TECHNIQUE:  Multidetector CT imaging of the abdomen and pelvis was performed using the standard protocol following bolus administration of  intravenous contrast. CONTRAST:  128mL ISOVUE-300 IOPAMIDOL (ISOVUE-300) INJECTION 61% COMPARISON:  April 27, 2017 FINDINGS: Lower chest: No acute abnormality. Hepatobiliary: There is diffuse low density of the liver. There is a cyst in the left lobe of liver measuring 1.8 cm unchanged. The liver is otherwise normal. Patient status post prior cholecystectomy. No biliary ductal dilatation. Pancreas: Unremarkable. No pancreatic ductal dilatation or surrounding inflammatory changes. Spleen: Normal in size without focal abnormality. Adrenals/Urinary Tract: Adrenal glands are unremarkable. Kidneys are without renal calculi, or hydronephrosis. There is a 1.1 cm cyst in the anterior midpole left kidney. Bladder is unremarkable. Stomach/Bowel: There is diverticulitis of the sigmoid colon with thickened sigmoid colonic wall. The previously noted several areas of micro perforation surrounding the diverticulitis is present and increased, there is a 2.6 x 4.1 cm abscess formation at this site. There is no colonic obstruction. The small bowel is normal. The appendix is normal. Vascular/Lymphatic: Aortic atherosclerosis. No enlarged abdominal or pelvic lymph nodes. Reproductive: Status post hysterectomy. No adnexal masses. Other: No free air is identified in the abdomen. Minimal fat containing ventral hernia is identified unchanged. Musculoskeletal: Degenerative joint changes of the spine are noted. Woke IMPRESSION: There is diverticulitis of the sigmoid colon with thickened sigmoid colonic wall. The previously noted several areas of micro perforation surrounding the diverticulitis is present and increased, there is a 2.6 x 4.1 cm abscess formation at this site. Electronically Signed   By: Abelardo Diesel M.D.   On: 05/03/2017 18:22    Anti-infectives: Anti-infectives    Start     Dose/Rate Route Frequency Ordered Stop   05/03/17 2245  piperacillin-tazobactam (ZOSYN) IVPB 3.375 g     3.375 g 12.5 mL/hr over 240 Minutes  Intravenous Every 8 hours 05/03/17 2235     05/03/17 2115  piperacillin-tazobactam (ZOSYN) IVPB 3.375 g  Status:  Discontinued     3.375 g 12.5 mL/hr over 240 Minutes Intravenous Every 8 hours 05/03/17 2103 05/03/17 2236      Assessment/Plan:  White blood cell count has normalized. She is on Zosyn at this time. We'll continue IV antibiotics today and likely be able to discharge tomorrow for follow-up in the office.  Florene Glen, MD, FACS  05/05/2017

## 2017-05-06 LAB — CBC WITH DIFFERENTIAL/PLATELET
BASOS PCT: 1 %
Basophils Absolute: 0 10*3/uL (ref 0–0.1)
Eosinophils Absolute: 0 10*3/uL (ref 0–0.7)
Eosinophils Relative: 1 %
HEMATOCRIT: 32.4 % — AB (ref 35.0–47.0)
Hemoglobin: 11.3 g/dL — ABNORMAL LOW (ref 12.0–16.0)
LYMPHS ABS: 1.8 10*3/uL (ref 1.0–3.6)
Lymphocytes Relative: 30 %
MCH: 29.3 pg (ref 26.0–34.0)
MCHC: 34.9 g/dL (ref 32.0–36.0)
MCV: 83.9 fL (ref 80.0–100.0)
MONO ABS: 0.4 10*3/uL (ref 0.2–0.9)
MONOS PCT: 7 %
NEUTROS ABS: 3.7 10*3/uL (ref 1.4–6.5)
Neutrophils Relative %: 61 %
Platelets: 330 10*3/uL (ref 150–440)
RBC: 3.86 MIL/uL (ref 3.80–5.20)
RDW: 13.4 % (ref 11.5–14.5)
WBC: 5.9 10*3/uL (ref 3.6–11.0)

## 2017-05-06 LAB — BASIC METABOLIC PANEL
ANION GAP: 8 (ref 5–15)
BUN: 14 mg/dL (ref 6–20)
CALCIUM: 8.2 mg/dL — AB (ref 8.9–10.3)
CHLORIDE: 107 mmol/L (ref 101–111)
CO2: 29 mmol/L (ref 22–32)
CREATININE: 0.75 mg/dL (ref 0.44–1.00)
GFR calc Af Amer: >60 mL/min (ref >60)
GFR calc non Af Amer: >60 mL/min (ref >60)
GLUCOSE: 99 mg/dL (ref 65–99)
Potassium: 3.3 mmol/L — ABNORMAL LOW (ref 3.5–5.1)
Sodium: 144 mmol/L (ref 135–145)

## 2017-05-06 MED ORDER — SODIUM CHLORIDE 0.9 % IV SOLN
1.0000 g | Freq: Three times a day (TID) | INTRAVENOUS | Status: DC
  Administered 2017-05-06 – 2017-05-07 (×3): 1 g via INTRAVENOUS
  Filled 2017-05-06 (×5): qty 1

## 2017-05-06 MED ORDER — SODIUM CHLORIDE 0.9% FLUSH
10.0000 mL | Freq: Two times a day (BID) | INTRAVENOUS | Status: DC
  Administered 2017-05-06 – 2017-05-07 (×2): 10 mL

## 2017-05-06 MED ORDER — SODIUM CHLORIDE 0.9% FLUSH: 10.0000 mL | INTRAVENOUS | Status: DC | PRN

## 2017-05-06 NOTE — Progress Notes (Signed)
Unsuccessful PICC insertion. The veins in the R arms are small (39% occupancy). Once cannulated the vein constricted and I was unable to thread the guidewire. I advised both the nurse and the patient that another IV PICC nurse would try to attempt this evening however, if that did not occur and if the patient needed the PICC before Monday than another resource could be used this weekend to place the PICC line. Both patient and nurse states understanding.  Catalina Pizza

## 2017-05-06 NOTE — Care Management (Addendum)
Notified by Leroy Sea from [facility] that all other agencies had been checked with, and the earliest the patient could be seen would be by "[facility]" on Sunday between 2-4.  However the plan is for the patient to be discharged tomorrow.  Since the patient does not have a preference of agency RNCM spoke with Otila Kluver at [facility] [phone].  Referral was accepted.  Information was faxed to [phone]. Hard copy of rx placed on chart.  Patient was updated.   PICC line to be placed. RNCM following

## 2017-05-06 NOTE — Progress Notes (Signed)
   05/03/17 2236  Height and Weight  Height 5\' 4"  (1.626 m)  Weight 167 lb 6.4 oz (75.9 kg)  BSA (Calculated - sq m) 1.85 sq meters  BMI (Calculated) 28.72  Weight in (lb) to have BMI = 25 145.3

## 2017-05-06 NOTE — Care Management (Addendum)
Notified by MD that Rebecca Obrien Rebecca Obrien has multiresistant organism which was only sensitive to gentamicin and meropenem.  Rebecca Obrien to discharge on IV meropenem for 10 days.  PCP Lennox Grumbles.  Rebecca Obrien to A&O, and to learn to administer IV medication at home. Home Health Agency preference provided.  Rebecca Obrien states that she does not have a preference of home health agency.  Referral made to [facility].  Referral has been accepted for IV medication, and Leroy Sea is working on arrangement for nursing services through potentially another company. Medication dose timing will have to be arranged so that Rebecca Obrien will receive morning dose, discharge to home, then home health can start afternoon dose.  RNCM following.

## 2017-05-06 NOTE — Progress Notes (Signed)
Pharmacy Antibiotic Note  Rebecca Obrien is a 51 y.o. female admitted on 05/03/2017 with intra abdominal infection.  Pharmacy has been consulted for merrem dosing.  Plan: Merrem 1gm iv q8h  Height: 5\' 4"  (162.6 cm) Weight: 167 lb 6.4 oz (75.9 kg) IBW/kg (Calculated) : 54.7  Temp (24hrs), Avg:98.1 F (36.7 C), Min:97.6 F (36.4 C), Max:98.4 F (36.9 C)   Recent Labs Lab 04/30/17 0522 05/03/17 1538 05/05/17 0512 05/06/17 0336  WBC 7.7 10.9 5.4 5.9  CREATININE  --  0.63 0.53 0.75    Estimated Creatinine Clearance: 82.1 mL/min (by C-G formula based on SCr of 0.75 mg/dL).    Allergies  Allergen Reactions  . Latex Dermatitis    With prolonged use     Antimicrobials this admission: 8/28 zosyn >> 8/31 8/31 merrem >>     Microbiology results: Results for orders placed or performed during the hospital encounter of 05/03/17  Aerobic/Anaerobic Culture (surgical/deep wound)     Status: None (Preliminary result)   Collection Time: 05/04/17 12:20 PM  Result Value Ref Range Status   Specimen Description ABSCESS  Final   Special Requests Normal  Final   Gram Stain   Final    ABUNDANT WBC PRESENT,BOTH PMN AND MONONUCLEAR ABUNDANT GRAM NEGATIVE RODS RARE GRAM POSITIVE COCCI RARE GRAM POSITIVE RODS    Culture   Final    ABUNDANT ESCHERICHIA COLI Confirmed Extended Spectrum Beta-Lactamase Producer (ESBL) Performed at [facility], [place] [address] [place], [place] [address]    Report Status PENDING  Incomplete   Organism ID, Bacteria ESCHERICHIA COLI  Final      Susceptibility   Escherichia coli - MIC*    AMPICILLIN >=32 RESISTANT Resistant     CEFAZOLIN >=64 RESISTANT Resistant     CEFEPIME RESISTANT Resistant     CEFTAZIDIME RESISTANT Resistant     CEFTRIAXONE >=64 RESISTANT Resistant     CIPROFLOXACIN >=4 RESISTANT Resistant     GENTAMICIN <=1 SENSITIVE Sensitive     IMIPENEM <=0.25 SENSITIVE Sensitive     TRIMETH/SULFA >=320 RESISTANT Resistant    AMPICILLIN/SULBACTAM >=32 RESISTANT Resistant     PIP/TAZO >=128 RESISTANT Resistant     Extended ESBL POSITIVE Resistant     * ABUNDANT ESCHERICHIA COLI   Thank you for allowing pharmacy to be a part of this patient's care.   Thomasenia Sales, PharmD, MBA, [facility]     05/06/2017 12:08 PM

## 2017-05-06 NOTE — Progress Notes (Signed)
CC: Acute diverticulitis Subjective: Patient states that her pain is completely gone she feels much better she has no nausea vomiting fevers or chills she refers to be discharged today rather than continue another day of IV antibiotics.  Objective: Vital signs in last 24 hours: Temp:  [97.9 F (36.6 C)-98.4 F (36.9 C)] 98.3 F (36.8 C) (08/31 0417) Pulse Rate:  [71-77] 72 (08/31 0417) Resp:  [16-20] 18 (08/31 0417) BP: (138-149)/(74-78) 138/74 (08/31 0417) SpO2:  [96 %-100 %] 96 % (08/31 0417) Last BM Date: 04/28/17  Intake/Output from previous day: 08/30 0701 - 08/31 0700 In: 820 [P.O.:720; IV Piggyback:100] Out: -  Intake/Output this shift: Total I/O In: 9.5 [IV Piggyback:9.5] Out: 0   Physical exam:  Vital signs reviewed and stable patient appears quite comfortable abdomen is soft nondistended nontympanitic and completely nontender calves are nontender no jaundice  Lab Results: CBC   Recent Labs  05/05/17 0512 05/06/17 0336  WBC 5.4 5.9  HGB 11.5* 11.3*  HCT 33.1* 32.4*  PLT 340 330   BMET  Recent Labs  05/05/17 0512 05/06/17 0336  NA 141 144  K 3.6 3.3*  CL 107 107  CO2 28 29  GLUCOSE 104* 99  BUN 10 14  CREATININE 0.53 0.75  CALCIUM 8.2* 8.2*   PT/INR No results for input(s): LABPROT, INR in the last 72 hours. ABG No results for input(s): PHART, HCO3 in the last 72 hours.  Invalid input(s): PCO2, PO2  Studies/Results: Ct Guided Needle Placement  Result Date: 05/04/2017 INDICATION: Diverticular abscess EXAM: CT GUIDED ASPIRATION OF PERICOLONIC ABSCESS MEDICATIONS: The patient is currently admitted to the hospital and receiving intravenous antibiotics. The antibiotics were administered within an appropriate time frame prior to the initiation of the procedure. ANESTHESIA/SEDATION: None COMPLICATIONS: None immediate. TECHNIQUE: Informed written consent was obtained from the patient after a thorough discussion of the procedural risks, benefits and  alternatives. All questions were addressed. Maximal Sterile Barrier Technique was utilized including caps, mask, sterile gowns, sterile gloves, sterile drape, hand hygiene and skin antiseptic. A timeout was performed prior to the initiation of the procedure. PROCEDURE: The operative field was prepped with Chlorhexidine in a sterile fashion, and a sterile drape was applied covering the operative field. A sterile gown and sterile gloves were used for the procedure. Local anesthesia was provided with 0.25% Marcaine. Initial CT evaluation demonstrates near complete evacuation of the fluid from the abscess cavity. It is predominately air-filled at this time and has decreased in size somewhat from the recent exam. An 18 gauge trocar needle was placed percutaneously into the collection and aspiration was performed with significant reduction in the amount of air within the cavity. Additionally a small amount of purulent fluid was aspirated without difficulty. The cavity was too small to allow for percutaneous drain placement. The fluid was sent for laboratory evaluation. Followup scanning shows near complete collapse of the cavity. Patient tolerated procedure well was returned her room in satisfactory condition. FINDINGS: As described above IMPRESSION: Reduction in size of the known pericolonic abscess with almost complete resolution of the fluid within. A small amount of purulent fluid was withdrawn from the abscess cavity. The size was too small to allow for more adequate drain placement. Electronically Signed   By: Inez Catalina M.D.   On: 05/04/2017 13:18    Anti-infectives: Anti-infectives    Start     Dose/Rate Route Frequency Ordered Stop   05/03/17 2245  piperacillin-tazobactam (ZOSYN) IVPB 3.375 g     3.375 g  12.5 mL/hr over 240 Minutes Intravenous Every 8 hours 05/03/17 2235     05/03/17 2115  piperacillin-tazobactam (ZOSYN) IVPB 3.375 g  Status:  Discontinued     3.375 g 12.5 mL/hr over 240 Minutes  Intravenous Every 8 hours 05/03/17 2103 05/03/17 2236      Assessment/Plan:  Labs are normal.  Patient doing well I suggested that she stay another 24 hours on IV antibiotics but she really wants to go home today therefore I suggested she states over this afternoon and get another dose at least of IV antibiotics. Then she can go home on oral antibiotics and oral analgesics to follow up in our office next week.  Florene Glen, MD, FACS  05/06/2017

## 2017-05-06 NOTE — Progress Notes (Signed)
Pt had ESBL in culture and notified by infection prevention that patient should be on contact. Per Dr. Burt Knack okay to place order for contact precautions.

## 2017-05-06 NOTE — Discharge Instructions (Signed)
Resume all previous home medications including recently prescribed oral antibiotics and analgesics.  Follow-up with [facility] in 10 days.  Should you experience any worsening of pain or fevers return to the emergency room.  Soft diet

## 2017-05-06 NOTE — Progress Notes (Signed)
Notified by pharmacy of a multiresistant organism which was only sensitive to gentamicin and meropenem. There is no suitable oral alternative.  I discussed this with the pharmacy and then with the patient. She really wants to go home but there is no suitable oral medication therefore a PICC line will be placed today and I have spoken to social services about arranging home health for Q8 hour meropenem for 10 days. A prescription has been written and home health has been consult it. Possibly discharge tomorrow.

## 2017-05-06 NOTE — Progress Notes (Signed)
Peripherally Inserted Central Catheter/Midline Placement  The IV Nurse has discussed with the patient and/or persons authorized to consent for the patient, the purpose of this procedure and the potential benefits and risks involved with this procedure.  The benefits include less needle sticks, lab draws from the catheter, and the patient may be discharged home with the catheter. Risks include, but not limited to, infection, bleeding, blood clot (thrombus formation), and puncture of an artery; nerve damage and irregular heartbeat and possibility to perform a PICC exchange if needed/ordered by physician.  Alternatives to this procedure were also discussed.  [employer] PICC patient education guide, fact sheet on infection prevention and patient information card has been provided to patient /or left at bedside.    PICC/Midline Placement Documentation     Consent obtained by Cherie Ouch RN   Marianna Payment M 05/06/2017, 5:51 PM

## 2017-05-07 MED ORDER — MEROPENEM IV (FOR PTA / DISCHARGE USE ONLY): 1.0000 g | Freq: Three times a day (TID) | INTRAVENOUS | 0 refills | Status: AC

## 2017-05-07 NOTE — Care Management Note (Signed)
Case Management Note  Patient Details  Name: Rebecca Obrien MRN: [id number] Date of Birth: Oct 07, 1965  Subjective/Objective:       Discussed discharge with IV Meropenum with Olegario Shearer at [facility]. [name] is being discharged home today with a single lumen PICC and Olegario Shearer was advised that next IV meropenum dose is due at 2pm today.  Per [name] will provide an RN for IV infusion teaching today at 2pm and will follow Ms People for IV antibiotic and PICC line needs.           Action/Plan:   Expected Discharge Date:  05/07/17               Expected Discharge Plan:     In-House Referral:     Discharge planning Services     Post Acute Care Choice:    Choice offered to:     DME Arranged:    DME Agency:     HH Arranged:    HH Agency:     Status of Service:     If discussed at [employer], dates discussed:    Additional Comments:  Faatimah Spielberg A, RN 05/07/2017, 10:28 AM

## 2017-05-07 NOTE — Progress Notes (Signed)
CC: Acute diverticulitis with abscess Subjective: This patient with acute diverticulitis with abscess and abscess drainage cultures have suggested the presence of a multi resistant organism for which she is been placed on meropenem with a PICC line.  Today she is feeling better she has no pain and is ready for discharge home health is been arranged.  Objective: Vital signs in last 24 hours: Temp:  [97.6 F (36.4 C)-98.3 F (36.8 C)] 98.3 F (36.8 C) (09/01 0413) Pulse Rate:  [72-75] 75 (09/01 0413) Resp:  [18-20] 18 (09/01 0413) BP: (145-163)/(79-96) 145/79 (09/01 0413) SpO2:  [96 %-99 %] 96 % (09/01 0413) Last BM Date: 05/06/17  Intake/Output from previous day: 08/31 0701 - 09/01 0700 In: 459.5 [P.O.:360; IV Piggyback:99.5] Out: 0  Intake/Output this shift: No intake/output data recorded.  Physical exam:  Vital signs are stable and afebrile Awake alert oriented Abdomen is soft nontender no peritoneal signs Calves are nontender No icterus no jaundice  Lab Results: CBC   Recent Labs  05/05/17 0512 05/06/17 0336  WBC 5.4 5.9  HGB 11.5* 11.3*  HCT 33.1* 32.4*  PLT 340 330   BMET  Recent Labs  05/05/17 0512 05/06/17 0336  NA 141 144  K 3.6 3.3*  CL 107 107  CO2 28 29  GLUCOSE 104* 99  BUN 10 14  CREATININE 0.53 0.75  CALCIUM 8.2* 8.2*   PT/INR No results for input(s): LABPROT, INR in the last 72 hours. ABG No results for input(s): PHART, HCO3 in the last 72 hours.  Invalid input(s): PCO2, PO2  Studies/Results: No results found.  Anti-infectives: Anti-infectives    Start     Dose/Rate Route Frequency Ordered Stop   05/06/17 1215  meropenem (MERREM) 1 g in sodium chloride 0.9 % 100 mL IVPB     1 g 200 mL/hr over 30 Minutes Intravenous Every 8 hours 05/06/17 1207     05/03/17 2245  piperacillin-tazobactam (ZOSYN) IVPB 3.375 g  Status:  Discontinued     3.375 g 12.5 mL/hr over 240 Minutes Intravenous Every 8 hours 05/03/17 2235 05/06/17 1207   05/03/17 2115  piperacillin-tazobactam (ZOSYN) IVPB 3.375 g  Status:  Discontinued     3.375 g 12.5 mL/hr over 240 Minutes Intravenous Every 8 hours 05/03/17 2103 05/03/17 2236      Assessment/Plan:  Patient doing quite well with a multiresistant organism sensitive only to gentamicin and meropenem. A PICC line has been placed. Home health has been arranged for meropenem. She will be discharged today in follow-up in my office next week.  Florene Glen, MD, FACS  05/07/2017

## 2017-05-07 NOTE — Discharge Summary (Signed)
Physician Discharge Summary  Patient ID: Rebecca Obrien MRN: [id number] DOB/AGE: [birth year] 51 y.o.  Admit date: 05/03/2017 Discharge date: 05/07/2017   Discharge Diagnoses:  Active Problems:   Diverticulitis of intestine with abscess   Procedures:CT-guided aspiration of a pelvic abscess, PICC  Hospital Course: This a patient with a pelvic abscess secondary to perforated diverticulitis. Aspiration of the mostly gas-filled abscess cavity revealed a multiresistant organism. A PICC line was placed for long-term antibiotics as no oral alternative is available this organism was only sensitive to meropenem and gentamicin. She is being discharged with a PICC line with home health for meropenem every 8 hours for approximately 10 days. She will be seen in the office next week and reevaluated.  Patient is to resume a soft diet shower and follow up in our office next week. She is also instructed to return to the emergency room should she experience increasing pain or fevers or chills.  Consults: Pharmacy  Disposition: 01-Home or Self Care  Discharge Instructions    Home infusion instructions [place] May follow [place] Dosing Protocol; May administer Cathflo as needed to maintain patency of vascular access device.; Flushing of vascular access device: per [facility] Protocol: 0.9% NaCl pre/post medica...    Complete by:  As directed    Instructions:  May follow [place] Dosing Protocol   Instructions:  May administer Cathflo as needed to maintain patency of vascular access device.   Instructions:  Flushing of vascular access device: per [facility] Protocol: 0.9% NaCl pre/post medication administration and prn patency; Heparin 100 u/ml, 62m for implanted ports and Heparin 10u/ml, 579mfor all other central venous catheters.   Instructions:  May follow [facility] Anaphylaxis Protocol for First Dose Administration in the home: 0.9% NaCl at 25-50 ml/hr to maintain IV access for protocol meds. Epinephrine 0.3  ml IV/IM PRN and Benadryl 25-50 IV/IM PRN s/s of anaphylaxis.   Instructions:  [facility] Coordinator (RN) to assist per patient IV care needs in the home PRN.   Nursing communication    Complete by:  As directed    Meropenem 1 g IV every 8 has been prescribed on paper for home health. The computer system would not allow for signature of the meropenem and it is left off of the ABS.     Allergies as of 05/07/2017      Reactions   Latex Dermatitis   With prolonged use       Medication List    STOP taking these medications   amoxicillin-clavulanate 875-125 MG tablet Commonly known as:  AUGMENTIN     TAKE these medications   hydrochlorothiazide 12.5 MG capsule Commonly known as:  MICROZIDE Take 1 capsule by mouth daily.   leflunomide 20 MG tablet Commonly known as:  ARAVA Take 1 tablet by mouth daily.   meloxicam 15 MG tablet Commonly known as:  MOBIC Take 1 tablet by mouth daily.   meropenem IVPB Commonly known as:  MERREM Inject 1 g into the vein every 8 (eight) hours. Indication:  Diverticulitis w/ draining abscess Last Day of Therapy:  05/16/17 Labs - Once weekly:  CBC/D and BMP, Labs - Every other week:  ESR and CRP   oxyCODONE-acetaminophen 5-325 MG tablet Commonly known as:  PERCOCET/ROXICET Take 1 tablet by mouth every 4 (four) hours as needed for severe pain.   potassium chloride 10 MEQ tablet Commonly known as:  K-DUR,KLOR-CON Take 10 mEq by mouth 2 (two) times daily.   potassium chloride 10  MEQ tablet Commonly known as:  K-DUR Take 2 tablets by mouth 2 (two) times daily.   predniSONE 5 MG tablet Commonly known as:  DELTASONE Take 4 tablets by mouth daily at 8 pm.   Tofacitinib Citrate 11 MG Tb24 Take 11 mg by mouth daily.            Home Infusion Instuctions        Start     Ordered   05/07/17 0000  Home infusion instructions [facility] May follow [place] Dosing Protocol; May administer Cathflo as needed to maintain  patency of vascular access device.; Flushing of vascular access device: per [facility] Protocol: 0.9% NaCl pre/post medica...    Question Answer Comment  Instructions May follow [place] Dosing Protocol   Instructions May administer Cathflo as needed to maintain patency of vascular access device.   Instructions Flushing of vascular access device: per [facility] Protocol: 0.9% NaCl pre/post medication administration and prn patency; Heparin 100 u/ml, 42m for implanted ports and Heparin 10u/ml, 57mfor all other central venous catheters.   Instructions May follow [facility] Anaphylaxis Protocol for First Dose Administration in the home: 0.9% NaCl at 25-50 ml/hr to maintain IV access for protocol meds. Epinephrine 0.3 ml IV/IM PRN and Benadryl 25-50 IV/IM PRN s/s of anaphylaxis.   Instructions [facility] Infusion Coordinator (RN) to assist per patient IV care needs in the home PRN.      05/07/17 0927       Discharge Care Instructions        Start     Ordered   05/07/17 0000  Nursing communication    Comments:  Meropenem 1 g IV every 8 has been prescribed on paper for home health. The computer system would not allow for signature of the meropenem and it is left off of the ABS.   05/07/17 0920   05/07/17 0000  Home infusion instructions [facility] May follow [facility] Protocol; May administer Cathflo as needed to maintain patency of vascular access device.; Flushing of vascular access device: [facility]: 0.9% NaCl pre/post medica...    Question Answer Comment  Instructions May follow [place] Protocol   Instructions May administer Cathflo as needed to maintain patency of vascular access device.   Instructions Flushing of vascular access device: per [facility]: 0.9% NaCl pre/post medication administration and prn patency; Heparin 100 u/ml, 39m28mor implanted ports and Heparin 10u/ml, 39ml79mr all other central venous catheters.   Instructions May follow [facility] Anaphylaxis  Protocol for First Dose Administration in the home: 0.9% NaCl at 25-50 ml/hr to maintain IV access for protocol meds. Epinephrine 0.3 ml IV/IM PRN and Benadryl 25-50 IV/IM PRN s/s of anaphylaxis.   Instructions [facility] Infusion Coordinator (RN) to assist per patient IV care needs in the home PRN.      05/07/17 0927   05/07/17 0000  meropenem (MERREM) IVPB  Every 8 hours     05/07/17 [id number] Follow-up Information    CoopFlorene Glen In 10 days.   Specialty:  Surgery Contact information: [phone]           RichFlorene Glen, FACS

## 2017-05-07 NOTE — Progress Notes (Signed)
Pt A and O x 4. VSS. Pt tolerating diet well. No complaints of pain or nausea. Pt discharged with PICC line intact, prescriptions given. Pt voiced understanding of discharge instructions with no further questions. Pt given information on PICC line care as well as ESBL. Home Health set-up by case management so that they will be able to administer 2pm dose. Pt discharged via wheelchair with nurse aide.

## 2017-05-09 LAB — AEROBIC/ANAEROBIC CULTURE W GRAM STAIN (SURGICAL/DEEP WOUND): Special Requests: NORMAL

## 2017-05-09 LAB — AEROBIC/ANAEROBIC CULTURE (SURGICAL/DEEP WOUND)

## 2017-05-10 ENCOUNTER — Telehealth: Payer: Self-pay

## 2017-05-10 ENCOUNTER — Telehealth: Payer: Self-pay | Admitting: General Practice

## 2017-05-10 NOTE — Telephone Encounter (Signed)
Spoke with patient at this time regarding recent visit and discharge from hospital. Patient denies fever,chills, nausea and vomiting at this time. No pain. She is having bowel movements and has good appetite. Follow up appointment 05/13/17 with Dr.Cooper.

## 2017-05-10 NOTE — Telephone Encounter (Signed)
Patient called and paid over the phone for her disability paperwork, paperwork has been placed in disability paperwork folder.

## 2017-05-11 DIAGNOSIS — K5781 Diverticulitis of intestine, part unspecified, with perforation and abscess with bleeding: Secondary | ICD-10-CM | POA: Diagnosis not present

## 2017-05-11 DIAGNOSIS — K651 Peritoneal abscess: Secondary | ICD-10-CM | POA: Diagnosis not present

## 2017-05-13 ENCOUNTER — Ambulatory Visit (INDEPENDENT_AMBULATORY_CARE_PROVIDER_SITE_OTHER): Payer: 59 | Admitting: Surgery

## 2017-05-13 ENCOUNTER — Encounter: Payer: Self-pay | Admitting: Surgery

## 2017-05-13 ENCOUNTER — Encounter: Payer: Self-pay | Admitting: *Deleted

## 2017-05-13 ENCOUNTER — Other Ambulatory Visit: Payer: Self-pay | Admitting: *Deleted

## 2017-05-13 VITALS — BP 152/87 | HR 88 | Temp 98.6°F | Ht 64.0 in | Wt 171.4 lb

## 2017-05-13 DIAGNOSIS — R1084 Generalized abdominal pain: Secondary | ICD-10-CM

## 2017-05-13 DIAGNOSIS — K5732 Diverticulitis of large intestine without perforation or abscess without bleeding: Secondary | ICD-10-CM | POA: Diagnosis not present

## 2017-05-13 NOTE — Patient Outreach (Addendum)
[facility]) Care Management  05/13/2017  Rebecca Obrien Sep 02, 1966 [id number]   Subjective: Telephone call to patient's home / mobile number, spoke with patient, and HIPAA verified.  Discussed [facility] Care Management [facility] Transition of care follow up, patient voiced understanding, and is in agreement to follow up.   Patient states she is doing much better, receiving home health services, has follow up appointment with surgeon today, and appointment went well.  Patient voices understanding of medical diagnosis and treatment plan.  States she is accessing the following [facility] benefits: outpatient pharmacy, hospital indemnity(not sure if benefit chosen, verbally given [place] [facility] contact number [phone], she will verify benefit, file claim if appropriate), and family medical leave act (FMLA) paperwork completion is in process.  Patient is aware that she will need to follow up on FMLA paperwork completion and obtain a copy of all related paperwork for her records.  Patient states she does not have any education material, transition of care, care coordination, disease management, disease monitoring, transportation, community resource, or pharmacy needs at this time. States she is very appreciative of the follow up and is in agreement to receive [place] Management information.   Objective: Per chart review, patient hospitalized 05/03/17 - 05/07/17 for Diverticulitis of intestine with abscess.   Patient hospitalized 04/27/17 -04/30/17 for Diverticulitis with microperforation. Patient also has a history of Rheumatoid arthritis, hypertension, hyperlipidemia, skin melanomaand stroke.    Assessment:Received [facility] Transition of care referral on 04/28/17. Transition of care follow up completed, no care management needs, and will proceed with case closure.    Plan: RNCM will send patient successful outreach letter, [facility] pamphlet, and magnet. RNCM will send case  closure due to follow up completed / no care management needs request to [facility] [place] Management.     Shelbie Franken H. [name], BSN, [place] Management [facility] Telephonic CM Phone: [phone] Fax: [phone]

## 2017-05-13 NOTE — Patient Instructions (Addendum)
We have seen you today in regards to your diverticulitis episodes. We have scheduled a CT scan on 05/16/17 @ [facility] @ 8 am. Please do not have anything to eat or drink after midnight the night before Please pick up your prep solution from Radiology today.  Please see your follow up appointment listed below.     Please avoid nuts, seeds, corn, and popcorn as much as possible. If you do indeed have to eat these things every so often, make sure that you chew these up as much as possible. I have also included some further diet information for you to review. Increase your water intake to approximately 72 oz. Daily. (This is approximately 9- 8oz. Glasses) to help avoid constipation.  Increase your fiber intake as much as possible (see diet below) and you may also use a stool softener (Colace / Docusate Sodium) to help soften stool as well as a mild laxative (Miralax / Polyethylene Glycol) to help you pass your stool. Your goal with bowel movements are 1-2 soft formed bowel movements daily.  If you begin having the same abdominal pain, nausea, vomiting, or fever - Please call our office immediately so that we may send you in some antibiotics to your pharmacy and we will have you follow-up the following day with our surgeon in the office.   Diverticulitis Diverticulitis is inflammation or infection of small pouches in your colon that form when you have a condition called diverticulosis. The pouches in your colon are called diverticula. Your colon, or large intestine, is where water is absorbed and stool is formed. Complications of diverticulitis can include:  Bleeding.  Severe infection.  Severe pain.  Perforation of your colon.  Obstruction of your colon. CAUSES  Diverticulitis is caused by bacteria. Diverticulitis happens when stool becomes trapped in diverticula. This allows bacteria to grow in the diverticula, which can lead to inflammation and infection. RISK  FACTORS People with diverticulosis are at risk for diverticulitis. Eating a diet that does not include enough fiber from fruits and vegetables may make diverticulitis more likely to develop. SYMPTOMS  Symptoms of diverticulitis may include:  Abdominal pain and tenderness. The pain is normally located on the left side of the abdomen, but may occur in other areas.  Fever and chills.  Bloating.  Cramping.  Nausea.  Vomiting.  Constipation.  Diarrhea.  Blood in your stool. DIAGNOSIS  Your health care provider will ask you about your medical history and do a physical exam. You may need to have tests done because many medical conditions can cause the same symptoms as diverticulitis. Tests may include:  Blood tests.  Urine tests.  Imaging tests of the abdomen, including X-rays and CT scans. When your condition is under control, your health care provider may recommend that you have a colonoscopy. A colonoscopy can show how severe your diverticula are and whether something else is causing your symptoms. TREATMENT  Most cases of diverticulitis are mild and can be treated at home. Treatment may include:  Taking over-the-counter pain medicines.  Following a clear liquid diet.  Taking antibiotic medicines by mouth for 7-10 days. More severe cases may be treated at a hospital. Treatment may include:  Not eating or drinking.  Taking prescription pain medicine.  Receiving antibiotic medicines through an IV tube.  Receiving fluids and nutrition through an IV tube.  Surgery. HOME CARE INSTRUCTIONS   Follow your health care provider's instructions carefully.  Follow a full liquid diet or other diet as directed  by your health care provider. After your symptoms improve, your health care provider may tell you to change your diet. He or she may recommend you eat a high-fiber diet. Fruits and vegetables are good sources of fiber. Fiber makes it easier to pass stool.  Take fiber  supplements or probiotics as directed by your health care provider.  Only take medicines as directed by your health care provider.  Keep all your follow-up appointments. SEEK MEDICAL CARE IF:   Your pain does not improve.  You have a hard time eating food.  Your bowel movements do not return to normal. SEEK IMMEDIATE MEDICAL CARE IF:   Your pain becomes worse.  Your symptoms do not get better.  Your symptoms suddenly get worse.  You have a fever.  You have repeated vomiting.  You have bloody or black, tarry stools. MAKE SURE YOU:   Understand these instructions.  Will watch your condition.  Will get help right away if you are not doing well or get worse.   This information is not intended to replace advice given to you by your health care provider. Make sure you discuss any questions you have with your health care provider.   Document Released: 06/02/2005 Document Revised: 08/28/2013 Document Reviewed: 07/18/2013 Elsevier Interactive Patient Education 2016 Elsevier Inc.   High-Fiber Diet Fiber, also called dietary fiber, is a type of carbohydrate found in fruits, vegetables, whole grains, and beans. A high-fiber diet can have many health benefits. Your health care provider may recommend a high-fiber diet to help:  Prevent constipation. Fiber can make your bowel movements more regular.  Lower your cholesterol.  Relieve hemorrhoids, uncomplicated diverticulosis, or irritable bowel syndrome.  Prevent overeating as part of a weight-loss plan.  Prevent heart disease, type 2 diabetes, and certain cancers. WHAT IS MY PLAN? The recommended daily intake of fiber includes:  38 grams for men under age 58.  27 grams for men over age 46.  37 grams for women under age 82.  46 grams for women over age 10. You can get the recommended daily intake of dietary fiber by eating a variety of fruits, vegetables, grains, and beans. Your health care provider may also recommend a  fiber supplement if it is not possible to get enough fiber through your diet. WHAT DO I NEED TO KNOW ABOUT A HIGH-FIBER DIET?  Fiber supplements have not been widely studied for their effectiveness, so it is better to get fiber through food sources.  Always check the fiber content on thenutrition facts label of any prepackaged food. Look for foods that contain at least 5 grams of fiber per serving.  Ask your dietitian if you have questions about specific foods that are related to your condition, especially if those foods are not listed in the following section.  Increase your daily fiber consumption gradually. Increasing your intake of dietary fiber too quickly may cause bloating, cramping, or gas.  Drink plenty of water. Water helps you to digest fiber. WHAT FOODS CAN I EAT? Grains Whole-grain breads. Multigrain cereal. Oats and oatmeal. Brown rice. Barley. Bulgur wheat. [place]. Bran muffins. Popcorn. Rye wafer crackers. Vegetables Sweet potatoes. Spinach. Kale. Artichokes. Cabbage. Broccoli. Green peas. Carrots. Squash. Fruits Berries. Pears. Apples. Oranges. Avocados. Prunes and raisins. Dried figs. Meats and Other Protein Sources Navy, kidney, pinto, and soy beans. Split peas. Lentils. Nuts and seeds. Dairy Fiber-fortified yogurt. Beverages Fiber-fortified soy milk. Fiber-fortified orange juice. Other Fiber bars. The items listed above may not be a complete list of  recommended foods or beverages. Contact your dietitian for more options. WHAT FOODS ARE NOT RECOMMENDED? Grains White bread. Pasta made with refined flour. White rice. Vegetables Fried potatoes. Canned vegetables. Well-cooked vegetables.  Fruits Fruit juice. Cooked, strained fruit. Meats and Other Protein Sources Fatty cuts of meat. Fried Sales executive or fried fish. Dairy Milk. Yogurt. Cream cheese. Sour cream. Beverages Soft drinks. Other Cakes and pastries. Butter and oils. The items listed above may not be a  complete list of foods and beverages to avoid. Contact your dietitian for more information. WHAT ARE SOME TIPS FOR INCLUDING HIGH-FIBER FOODS IN MY DIET?  Eat a wide variety of high-fiber foods.  Make sure that half of all grains consumed each day are whole grains.  Replace breads and cereals made from refined flour or white flour with whole-grain breads and cereals.  Replace white rice with brown rice, bulgur wheat, or millet.  Start the day with a breakfast that is high in fiber, such as a cereal that contains at least 5 grams of fiber per serving.  Use beans in place of meat in soups, salads, or pasta.  Eat high-fiber snacks, such as berries, and raw vegetables.   This information is not intended to replace advice given to you by your health care provider. Make sure you discuss any questions you have with your health care provider.   Document Released: 08/23/2005 Document Revised: 09/13/2014 Document Reviewed: 02/05/2014 Elsevier Interactive Patient Education [employer].

## 2017-05-13 NOTE — Progress Notes (Signed)
Outpatient Surgical Follow Up  05/13/2017  Rebecca Obrien is an 51 y.o. female.   CC: Perforated diverticulitis  HPI: This patient with a history of perforated diverticulitis and a pelvic abscess. Aspiration of this abscess abscess demonstrated a poly-resistant organism and she is on meropenem via PICC line.  Patient feels well. She has no pain no fevers chills she is eating well and having normal bowel movements. PICC line is functional.  Past Medical History:  Diagnosis Date  . Arthritis    RA  . Cancer ([place])    skin melanoma  . Collagen vascular disease (HCC)    RA  . Difficult airway   . Hyperlipidemia   . Hypertension   . Hyperthyroidism   . Hypokalemia   . Obesity   . Rheumatoid arthritis(714.0)   . Stroke [facility])     Past Surgical History:  Procedure Laterality Date  . ABDOMINAL HYSTERECTOMY    . CARPAL TUNNEL RELEASE  Bilateral  . CHOLECYSTECTOMY    . TEE WITHOUT CARDIOVERSION  07/26/2012   Procedure: TRANSESOPHAGEAL ECHOCARDIOGRAM (TEE);  Surgeon: Thayer Headings, MD;  Location: [facility] ENDOSCOPY;  Service: Cardiovascular;  Laterality: N/A;    Family History  Problem Relation Age of Onset  . Nephrolithiasis Mother   . Hypertension Mother   . Hypertension Father   . Heart failure Father   . Cardiomyopathy Brother     Social History:  reports that she has been smoking Cigarettes.  She has a 23.00 pack-year smoking history. She has never used smokeless tobacco. She reports that she does not drink alcohol or use drugs.  Allergies:  Allergies  Allergen Reactions  . Latex Dermatitis    With prolonged use     Medications reviewed.   Review of Systems:   Review of Systems  Constitutional: Negative.   HENT: Negative.   Eyes: Negative.   Respiratory: Negative.   Cardiovascular: Negative.   Gastrointestinal: Negative.   Genitourinary: Negative.   Musculoskeletal: Negative.   Skin: Negative.   Neurological: Negative.   Endo/Heme/Allergies: Negative.    Psychiatric/Behavioral: Negative.      Physical Exam:  BP (!) 152/87   Pulse 88   Temp 98.6 F (37 C) (Oral)   Ht 5\' 4"  (1.626 m)   Wt 171 lb 6.4 oz (77.7 kg)   BMI 29.42 kg/m   Physical Exam  Constitutional: She is oriented to person, place, and time and well-developed, well-nourished, and in no distress. No distress.  HENT:  Head: Normocephalic and atraumatic.  Eyes: Pupils are equal, round, and reactive to light. Right eye exhibits no discharge. Left eye exhibits no discharge. No scleral icterus.  Abdominal: Soft. She exhibits no distension. There is no tenderness. There is no rebound and no guarding.  Musculoskeletal: Normal range of motion. She exhibits no edema.  Scar left lower leg  Neurological: She is alert and oriented to person, place, and time.  Skin: Skin is warm and dry. No rash noted. She is not diaphoretic. No erythema.  Psychiatric: Mood and affect normal.  Vitals reviewed.     No results found for this or any previous visit (from the past 48 hour(s)). No results found.  Assessment/Plan:  This a patient with organism in her pelvic abscess that is multi resistant. She is on meropenem 3 times a day. My recommendations as she has improved so much clinically but has a history of having been readmitted to the hospital for this that she have a CT scan on Monday and continue  IV antibiotics through midweek. Midweek we could then evaluate the CT scan results and consider stopping the antibiotics and removing the PICC line.  She will require colon resection in the near future as well. I could see her back after the PICC line is removed later in the month and discuss surgical options with her at that time. Mobile questions were answered for her she understood and agreed with this plan.   Florene Glen, MD, FACS

## 2017-05-16 ENCOUNTER — Ambulatory Visit
Admission: RE | Admit: 2017-05-16 | Discharge: 2017-05-16 | Disposition: A | Discharge status: Home / Self Care | Payer: 59 | Source: Ambulatory Visit | Attending: Surgery | Admitting: Surgery

## 2017-05-16 DIAGNOSIS — K439 Ventral hernia without obstruction or gangrene: Secondary | ICD-10-CM | POA: Insufficient documentation

## 2017-05-16 DIAGNOSIS — R1084 Generalized abdominal pain: Secondary | ICD-10-CM | POA: Diagnosis not present

## 2017-05-16 DIAGNOSIS — K5781 Diverticulitis of intestine, part unspecified, with perforation and abscess with bleeding: Secondary | ICD-10-CM | POA: Diagnosis not present

## 2017-05-16 DIAGNOSIS — K5730 Diverticulosis of large intestine without perforation or abscess without bleeding: Secondary | ICD-10-CM | POA: Diagnosis not present

## 2017-05-16 DIAGNOSIS — K651 Peritoneal abscess: Secondary | ICD-10-CM | POA: Diagnosis not present

## 2017-05-16 DIAGNOSIS — K760 Fatty (change of) liver, not elsewhere classified: Secondary | ICD-10-CM | POA: Insufficient documentation

## 2017-05-16 DIAGNOSIS — K5732 Diverticulitis of large intestine without perforation or abscess without bleeding: Secondary | ICD-10-CM | POA: Diagnosis not present

## 2017-05-16 DIAGNOSIS — I700 Atherosclerosis of aorta: Secondary | ICD-10-CM | POA: Insufficient documentation

## 2017-05-16 MED ORDER — IOPAMIDOL (ISOVUE-300) INJECTION 61%
100.0000 mL | Freq: Once | INTRAVENOUS | Status: AC | PRN
  Administered 2017-05-16: 100 mL via INTRAVENOUS

## 2017-05-17 DIAGNOSIS — K5781 Diverticulitis of intestine, part unspecified, with perforation and abscess with bleeding: Secondary | ICD-10-CM | POA: Diagnosis not present

## 2017-05-17 DIAGNOSIS — K651 Peritoneal abscess: Secondary | ICD-10-CM | POA: Diagnosis not present

## 2017-05-18 ENCOUNTER — Telehealth: Payer: Self-pay

## 2017-05-18 ENCOUNTER — Ambulatory Visit (INDEPENDENT_AMBULATORY_CARE_PROVIDER_SITE_OTHER): Payer: 59 | Admitting: General Surgery

## 2017-05-18 ENCOUNTER — Encounter: Payer: Self-pay | Admitting: General Surgery

## 2017-05-18 VITALS — BP 151/94 | HR 90 | Temp 98.2°F | Ht 64.0 in | Wt 170.2 lb

## 2017-05-18 DIAGNOSIS — K5720 Diverticulitis of large intestine with perforation and abscess without bleeding: Secondary | ICD-10-CM | POA: Diagnosis not present

## 2017-05-18 MED ORDER — NEOMYCIN SULFATE 500 MG PO TABS: 1000.0000 mg | ORAL_TABLET | Freq: Three times a day (TID) | ORAL | 0 refills | Status: DC

## 2017-05-18 MED ORDER — ERYTHROMYCIN BASE 500 MG PO TABS: 1000.0000 mg | ORAL_TABLET | Freq: Three times a day (TID) | ORAL | 0 refills | Status: DC

## 2017-05-18 MED ORDER — POLYETHYLENE GLYCOL 3350 17 GM/SCOOP PO POWD: 1.0000 | Freq: Once | ORAL | 0 refills | Status: AC

## 2017-05-18 MED ORDER — BISACODYL 5 MG PO TBEC: 5.0000 mg | DELAYED_RELEASE_TABLET | Freq: Once | ORAL | 0 refills | Status: AC

## 2017-05-18 NOTE — Telephone Encounter (Signed)
Patient advised of Pre op date and time and Surgery date.  Sx: 05/31/17 - Dr Adonis Huguenin, with Dr Genevive Bi to assist - Lap Sig Colectomy, urethral stents to be placed by Urology(Dr Erlene Quan)  Pre Op: 05/24/17 at 11:15 am, Office  Patient made aware to call [phone], between 1-3 pm the day before surgery to find out what time to arrive.

## 2017-05-18 NOTE — Progress Notes (Signed)
Outpatient Surgical Follow Up  05/18/2017  Rebecca Obrien is an 51 y.o. female.   Chief Complaint  Patient presents with  . Follow-up    Pelvic Abscess-Discuss Ct scan results from 05/16/17    HPI:  51 year old female who is well known to the surgery service, returns to clinic for follow up of her recent CT scan that was performed due to pelvic abscess secondary to diverticulitis. Patient reports that she continues to have intermittent left lower quadrant abdominal pain. It is tolerable and not near as severe as when she was in the hospital. Does not stop her from doing what she wants to do and should does not require her to take pain medication. She has been eating well and having normal bowel function. She is still on IV Merrem for antibiotics. She denies any current fevers, chills, nausea, vomiting, chest pain, shortness of breath, diarrhea, constipation.  Past Medical History:  Diagnosis Date  . Arthritis    RA  . Cancer ([place])    skin melanoma  . Collagen vascular disease (HCC)    RA  . Difficult airway   . Hyperlipidemia   . Hypertension   . Hyperthyroidism   . Hypokalemia   . Obesity   . Rheumatoid arthritis(714.0)   . Stroke [facility])     Past Surgical History:  Procedure Laterality Date  . ABDOMINAL HYSTERECTOMY    . CARPAL TUNNEL RELEASE  Bilateral  . CHOLECYSTECTOMY    . TEE WITHOUT CARDIOVERSION  07/26/2012   Procedure: TRANSESOPHAGEAL ECHOCARDIOGRAM (TEE);  Surgeon: Thayer Headings, MD;  Location: [facility] ENDOSCOPY;  Service: Cardiovascular;  Laterality: N/A;    Family History  Problem Relation Age of Onset  . Nephrolithiasis Mother   . Hypertension Mother   . Hypertension Father   . Heart failure Father   . Cardiomyopathy Brother     Social History:  reports that she has been smoking Cigarettes.  She has a 23.00 pack-year smoking history. She has never used smokeless tobacco. She reports that she does not drink alcohol or use drugs.  Allergies:  Allergies   Allergen Reactions  . Latex Dermatitis    With prolonged use     Medications reviewed. Allergies as of 05/18/2017      Reactions   Latex Dermatitis   With prolonged use       Medication List       Accurate as of 05/18/17 10:21 AM. Always use your most recent med list.          bisacodyl 5 MG EC tablet Commonly known as:  DULCOLAX Take 1 tablet (5 mg total) by mouth once.   erythromycin base 500 MG tablet Commonly known as:  E-MYCIN Take 2 tablets (1,000 mg total) by mouth 3 (three) times daily.   hydrochlorothiazide 12.5 MG capsule Commonly known as:  MICROZIDE Take 1 capsule by mouth daily.   leflunomide 20 MG tablet Commonly known as:  ARAVA Take 1 tablet by mouth daily.   meloxicam 15 MG tablet Commonly known as:  MOBIC Take 1 tablet by mouth daily.   neomycin 500 MG tablet Commonly known as:  MYCIFRADIN Take 2 tablets (1,000 mg total) by mouth 3 (three) times daily.   oxyCODONE-acetaminophen 5-325 MG tablet Commonly known as:  PERCOCET/ROXICET Take 1 tablet by mouth every 4 (four) hours as needed for severe pain.   polyethylene glycol powder powder Commonly known as:  GLYCOLAX/MIRALAX Take 255 g by mouth once.   potassium chloride 10 MEQ tablet Commonly  known as:  K-DUR Take 2 tablets by mouth 2 (two) times daily.   predniSONE 5 MG tablet Commonly known as:  DELTASONE Take 4 tablets by mouth daily at 8 pm.   Tofacitinib Citrate 11 MG Tb24 Take 11 mg by mouth daily.            Discharge Care Instructions        Start     Ordered   05/18/17 0000  bisacodyl (DULCOLAX) 5 MG EC tablet   Once     05/18/17 1012   05/18/17 0000  polyethylene glycol powder (GLYCOLAX/MIRALAX) powder   Once     05/18/17 1012   05/18/17 0000  neomycin (MYCIFRADIN) 500 MG tablet  3 times daily     05/18/17 1012   05/18/17 0000  erythromycin base (E-MYCIN) 500 MG tablet  3 times daily     05/18/17 1012        ROS A multipoint review of systems was  completed, all pertinent positives and negatives are documented within the history of present illness and remainder are negative   BP (!) 151/94   Pulse 90   Temp 98.2 F (36.8 C) (Oral)   Ht 5\' 4"  (1.626 m)   Wt 77.2 kg (170 lb 3.2 oz)   BMI 29.21 kg/m   Physical Exam Gen.: No acute distress Neck: Supple and nontender. Well-healed thyroidectomy incision site. Lymph nodes: No evidence of lymphadenopathy Chest: Clear to auscultation Heart: Regular rate and rhythm Abdomen: Soft, minimally percent tender to deep palpation in the left lower quadrant, nondistended. Extremities: Moves all extremities well. Skin: Multiple areas of skin discoloration but without obvious mass, lump, pathologic wounds. Neuro: Cranial nerves II through XII grossly intact Psych: Patient is appropriate, alert and oriented.    No results found for this or any previous visit (from the past 48 hour(s)). No results found.  Assessment/Plan:  1. Diverticulitis of large intestine with abscess without bleeding 51 year old female with recurrent sigmoid diverticulitis with abscess. Recent CT scan reviewed with the patient which shows a persistent left lower quadrant abscess area did although the abscess is smaller it is still present. Discussed with the patient given the persistence of the abscess that in this time to discuss scheduling surgery to remove the area of the colon that is causing the infection. Described at length the procedure of a laparoscopic sigmoid colectomy. Discussed the risks, benefits, alternatives. The primary risks are of need to convert to an open procedure, anastomotic leak requiring ostomy, damage to the surrounding structures such as the ureter small bowel, need for prolonged hospital stay should there be any further complications. Patient voiced understanding. She no she is at higher risk from surgery due to her multiple other medical problems including her prior melanoma, prior thyroidectomy,  prior abdominal surgeries. Patient voiced understanding and desires to proceed.  Plan for laparoscopic sigmoid colectomy on Tuesday, September 25. A total of 40 minutes was used on this encounter greater than 50% of a user counseling and coordination of care.  Clayburn Pert, MD FACS General Surgeon  05/18/2017,10:16 AM

## 2017-05-18 NOTE — Patient Instructions (Signed)
We have discussed removing a portion of your damaged colon through 4 small incisions today. We have scheduled this surgery for 05/31/17 at [facility] with Dr. Adonis Huguenin. Please plan a hospital stay of 3-7 days for surgery and recovery time.  We will have you complete a bowel prep prior to your surgery. Please see information provided. You have also been given a (Blue) Pre-Care Sheet with more information regarding your particular surgery. Please review all information given.  Please call our office with any questions or concerns prior to your scheduled surgery.   Laparoscopic Colectomy Laparoscopic colectomy is surgery to remove part or all of the large intestine (colon). This procedure may be used to treat several conditions, including:  Inflammation and infection of the colon (diverticulitis).  Tumors or masses in the colon.  Inflammatory bowel disease, such as Crohn disease or ulcerative colitis. Colectomy is an option when symptoms cannot be controlled with medicines.  Bleeding from the colon that cannot be controlled by another method.  Blockage or obstruction of the colon.  Tell a health care provider about:  Any allergies you have.  All medicines you are taking, including vitamins, herbs, eye drops, creams, and over-the-counter medicines.  Any problems you or family members have had with anesthetic medicines.  Any blood disorders you have.  Any surgeries you have had.  Any medical conditions you have. What are the risks? Generally, this is a safe procedure. However, problems may occur, including:  Infection.  Bleeding.  Allergic reactions to medicines or dyes.  Damage to other structures or organs.  Leaking from where the colon was sewn together.  Future blockage of the small intestines from scar tissue. Another surgery may be needed to repair this.  Needing to convert to an open procedure. Complications such as damage to other organs or excessive bleeding may require  the surgeon to convert from a laparoscopic procedure to an open procedure. This involves making a larger incision in the abdomen.  What happens before the procedure? Staying hydrated Follow instructions from your health care provider about hydration, which may include:  Up to 2 hours before the procedure - you may continue to drink clear liquids, such as water, clear fruit juice, black coffee, and plain tea.  Eating and drinking restrictions Follow instructions from your health care provider about eating and drinking, which may include:  8 hours before the procedure - stop eating heavy meals, meals with high fiber, or foods such as meat, fried foods, or fatty foods.  6 hours before the procedure - stop eating light meals or foods, such as toast or cereal.  6 hours before the procedure - stop drinking milk or drinks that contain milk.  2 hours before the procedure - stop drinking clear liquids.  Medicines  Ask your health care provider about: ? Changing or stopping your regular medicines. This is especially important if you are taking diabetes medicines or blood thinners. ? Taking medicines such as aspirin and ibuprofen. These medicines can thin your blood. Do not take these medicines before your procedure if your health care provider instructs you not to.  You may be given antibiotic medicine to clean out bacteria from your colon. Follow the directions carefully and take the medicine at the correct time. General instructions  You may be prescribed an oral bowel prep to clean out your colon in preparation for the surgery: ? Follow instructions from your health care provider about how to do this. ? Do not eat or drink anything else after  you have started the bowel prep, unless your health care provider tells you it is safe to do so.  Do not use any products that contain nicotine or tobacco, such as cigarettes and e-cigarettes. If you need help quitting, ask your health care  provider. What happens during the procedure?  To reduce your risk of infection: ? Your health care team will wash or sanitize their hands. ? Your skin will be washed with soap.  An IV tube will be inserted into one of your veins to deliver fluid and medication.  You will be given one of the following: ? A medicine to help you relax (sedative). ? A medicine to make you fall asleep (general anesthetic).  Small monitors will be connected to your body. They will be used to check your heart, blood pressure, and oxygen level.  A breathing tube may be placed into your lungs during the procedure.  A thin, flexible tube (catheter) will be placed into your bladder to drain urine.  A tube may be placed through your nose and into your stomach to drain stomach fluids (nasogastric tube, or NG tube).  Your abdomen will be filled with air so it expands. This gives the surgeon more room to operate and makes your organs easier to see.  Several small cuts (incisions) will be made in your abdomen.  A thin, lighted tube with a tiny camera on the end (laparoscope) will be put through one of the small incisions. The camera on the laparoscope will send a picture to a computer screen in the operating room. This will give the surgeon a good view inside your abdomen.  Hollow tubes will be put through the other small incisions in your abdomen. The tools that are needed for the procedure will be put through these tubes.  Clamps or staples will be put on both ends of the diseased part of the colon.  The part of the intestine between the clamps or staples will be removed.  If possible, the ends of the healthy colon that remain will be stitched (sutured) or stapled together to allow your body to pass waste (stool).  Sometimes, the remaining colon cannot be stitched back together. If this is the case, a colostomy will be needed. If you need a colostomy: ? An opening to the outside of your body (stoma) will be  made through your abdomen. ? The end of your colon will be brought to the opening. It will be stitched to the skin. ? A bag will be attached to the opening. Stool will drain into this removable bag. ? The colostomy may be temporary or permanent.  The incisions from the colectomy will be closed with sutures or staples. The procedure may vary among health care providers and hospitals. What happens after the procedure?  Your blood pressure, heart rate, breathing rate, and blood oxygen level will be monitored until the medicines you were given have worn off.  You will receive fluids through an IV tube until your bowels start to work properly.  Once your bowels are working again, you will be given clear liquids first and then solid food as tolerated.  You will be given medicines to control your pain and nausea, if needed.  Do not drive for 24 hours if you were given a sedative. This information is not intended to replace advice given to you by your health care provider. Make sure you discuss any questions you have with your health care provider. Document Released: 11/13/2002 Document Revised: 05/24/2016  Document Reviewed: 05/24/2016 Elsevier Interactive Patient Education  [employer].     Laparoscopic Colectomy, Care After This sheet gives you information about how to care for yourself after your procedure. Your health care provider may also give you more specific instructions. If you have problems or questions, contact your health care provider. What can I expect after the procedure? After your procedure, it is common to have the following:  Pain in your abdomen, especially in the incision areas. You will be given medicine to control the pain.  Tiredness. This is a normal part of the recovery process. Your energy level will return to normal over the next several weeks.  Changes in your bowel movements, such as constipation or needing to go more often. Talk with your health care  provider about how to manage this.  Follow these instructions at home: Medicines  Take over-the-counter and prescription medicines only as told by your health care provider.  Do not drive or use heavy machinery while taking prescription pain medicine.  Do not drink alcohol while taking prescription pain medicine.  If you were prescribed an antibiotic medicine, use it as told by your health care provider. Do not stop using the antibiotic even if you start to feel better. Incision care  Follow instructions from your health care provider about how to take care of your incision areas. Make sure you: ? Keep your incisions clean and dry. ? Wash your hands with soap and water before and after applying medicine to the areas, and before and after changing your bandage (dressing). If soap and water are not available, use hand sanitizer. ? Change your dressing as told by your health care provider. ? Leave stitches (sutures), skin glue, or adhesive strips in place. These skin closures may need to stay in place for 2 weeks or longer. If adhesive strip edges start to loosen and curl up, you may trim the loose edges. Do not remove adhesive strips completely unless your health care provider tells you to do that.  Do not wear tight clothing over the incisions. Tight clothing may rub and irritate the incision areas, which may cause the incisions to open.  Do not take baths, swim, or use a hot tub until your health care provider approves. Ask your health care provider if you can take showers. You may only be allowed to take sponge baths for bathing.  Check your incision area every day for signs of infection. Check for: ? More redness, swelling, or pain. ? More fluid or blood. ? Warmth. ? Pus or a bad smell. Activity  Avoid lifting anything that is heavier than 10 lb (4.5 kg) for 2 weeks or until your health care provider says it is okay.  You may resume normal activities as told by your health care  provider. Ask your health care provider what activities are safe for you.  Take rest breaks during the day as needed. Eating and drinking  Follow instructions from your health care provider about what you can eat after surgery.  To prevent or treat constipation while you are taking prescription pain medicine, your health care provider may recommend that you: ? Drink enough fluid to keep your urine clear or pale yellow. ? Take over-the-counter or prescription medicines. ? Eat foods that are high in fiber, such as fresh fruits and vegetables, whole grains, and beans. ? Limit foods that are high in fat and processed sugars, such as fried and sweet foods. General instructions  Ask your health care  provider when you will need an appointment to get your sutures or staples removed.  Keep all follow-up visits as told by your health care provider. This is important. Contact a health care provider if:  You have more redness, swelling, or pain around your incisions.  You have more fluid or blood coming from the incisions.  Your incisions feel warm to the touch.  You have pus or a bad smell coming from your incisions or your dressing.  You have a fever.  You have an incision that breaks open (edges not staying together) after sutures or staples have been removed. Get help right away if:  You develop a rash.  You have chest pain or difficulty breathing.  You have pain or swelling in your legs.  You feel light-headed or you faint.  Your abdomen swells (becomes distended).  You have nausea or vomiting.  You have blood in your stool (feces). This information is not intended to replace advice given to you by your health care provider. Make sure you discuss any questions you have with your health care provider. Document Released: 03/12/2005 Document Revised: 05/24/2016 Document Reviewed: 05/24/2016 Elsevier Interactive Patient Education  [employer].

## 2017-05-18 NOTE — Telephone Encounter (Signed)
Spoke with Jerl Santos at [facility] to continue orders for Meropenem 1 GM every 8 hours.   Faxed Completed Surgery Booking Request to [facility] and Pre-admit. Email sent to [facility] pool.  Medicines for prep sent to [place].   Spoke with Amy @ [place] and confirmed Urethral stent placement 05/31/17.   Bowel Prep instructions reviewed with patient.

## 2017-05-18 NOTE — Telephone Encounter (Signed)
Error

## 2017-05-19 ENCOUNTER — Telehealth: Payer: Self-pay

## 2017-05-19 NOTE — Telephone Encounter (Signed)
Patients FMLA and disability paperwork has been completed and faxed.

## 2017-05-20 MED FILL — XELJANZ XR 11 MG TB24: 11 | 30 days supply | Qty: 30 | Fill #2

## 2017-05-21 DIAGNOSIS — K651 Peritoneal abscess: Secondary | ICD-10-CM | POA: Diagnosis not present

## 2017-05-21 DIAGNOSIS — K5781 Diverticulitis of intestine, part unspecified, with perforation and abscess with bleeding: Secondary | ICD-10-CM | POA: Diagnosis not present

## 2017-05-24 ENCOUNTER — Telehealth: Payer: Self-pay

## 2017-05-24 ENCOUNTER — Encounter
Admission: RE | Admit: 2017-05-24 | Discharge: 2017-05-24 | Disposition: A | Discharge status: Home / Self Care | Payer: 59 | Source: Ambulatory Visit | Attending: General Surgery | Admitting: General Surgery

## 2017-05-24 DIAGNOSIS — Z01812 Encounter for preprocedural laboratory examination: Secondary | ICD-10-CM | POA: Diagnosis not present

## 2017-05-24 DIAGNOSIS — E890 Postprocedural hypothyroidism: Secondary | ICD-10-CM | POA: Diagnosis not present

## 2017-05-24 DIAGNOSIS — I10 Essential (primary) hypertension: Secondary | ICD-10-CM | POA: Insufficient documentation

## 2017-05-24 DIAGNOSIS — Z8673 Personal history of transient ischemic attack (TIA), and cerebral infarction without residual deficits: Secondary | ICD-10-CM | POA: Diagnosis not present

## 2017-05-24 DIAGNOSIS — K5792 Diverticulitis of intestine, part unspecified, without perforation or abscess without bleeding: Secondary | ICD-10-CM | POA: Diagnosis not present

## 2017-05-24 DIAGNOSIS — E876 Hypokalemia: Secondary | ICD-10-CM | POA: Diagnosis not present

## 2017-05-24 DIAGNOSIS — Z720 Tobacco use: Secondary | ICD-10-CM | POA: Insufficient documentation

## 2017-05-24 DIAGNOSIS — M0579 Rheumatoid arthritis with rheumatoid factor of multiple sites without organ or systems involvement: Secondary | ICD-10-CM | POA: Insufficient documentation

## 2017-05-24 DIAGNOSIS — K5780 Diverticulitis of intestine, part unspecified, with perforation and abscess without bleeding: Secondary | ICD-10-CM | POA: Diagnosis not present

## 2017-05-24 HISTORY — DX: Diverticulitis of large intestine without perforation or abscess without bleeding: K57.32

## 2017-05-24 HISTORY — DX: Personal history of urinary calculi: Z87.442

## 2017-05-24 LAB — SURGICAL PCR SCREEN
MRSA, PCR: NEGATIVE
Staphylococcus aureus: NEGATIVE

## 2017-05-24 LAB — HEMOGLOBIN A1C
Hgb A1c MFr Bld: 5.5 % (ref 4.8–5.6)
MEAN PLASMA GLUCOSE: 111.15 mg/dL

## 2017-05-24 LAB — POTASSIUM: POTASSIUM: 4 mmol/L (ref 3.5–5.1)

## 2017-05-24 NOTE — Patient Instructions (Signed)
Your procedure is scheduled on: May 31, 2017 [facility] ) Report to Same Day Surgery 2nd floor medical mall ([place] Entrance-take elevator on left to 2nd floor.  Check in with surgery information desk.) To find out your arrival time please call [phone] between 1PM - 3PM on May 30, 2017 (MONDAY)    Remember: Instructions that are not followed completely may result in serious medical risk, up to and including death, or upon the discretion of your surgeon and anesthesiologist your surgery may need to be rescheduled.    _x___ 1. Do not eat food after midnight the night before your procedure. You may drink clear liquids up to 2 hours before you are scheduled to arrive at the hospital for your procedure.  Do not drink clear liquids within 2 hours of your scheduled arrival to the hospital.  Clear liquids include  --Water or Apple juice without pulp  --Clear carbohydrate beverage such as ClearFast or Gatorade  --Black Coffee or Clear Tea (No milk, no creamers, do not add anything to                  the coffee or Tea Type 1 and type 2 diabetics should only drink water.  No gum chewing or hard candies.     __x__ 2. No Alcohol for 24 hours before or after surgery.   __x__3. No Smoking for 24 prior to surgery.   ____  4. Bring all medications with you on the day of surgery if instructed.    __x__ 5. Notify your doctor if there is any change in your medical condition     (cold, fever, infections).     Do not wear jewelry, make-up, hairpins, clips or nail polish.  Do not wear lotions, powders, or perfumes.   Do not shave 48 hours prior to surgery. Men may shave face and neck.  Do not bring valuables to the hospital.    [facility] is not responsible for any belongings or valuables.               Contacts, dentures or bridgework may not be worn into surgery.  Leave your suitcase in the car. After surgery it may be brought to your room.  For patients admitted to the  hospital, discharge time is determined by your treatment team                     Patients discharged the day of surgery will not be allowed to drive home.  You will need someone to drive you home and stay with you the night of your procedure.    Please read over the following fact sheets that you were given:   [facility] Preparing for Surgery and or MRSA Information   TAKE THE FOLLOWING MEDICATION THE MORNING OF SURGERY WITH A SIP OF WATER :  1. LEVOTHYROXINE  2. FOLLOW DR. Adonis Huguenin  COLON PREP AS INSTRUCTED   3.  4.  5.  6.  ____Fleets enema or Magnesium Citrate as directed.   _x___ Use CHG Soap or sage wipes as directed on instruction sheet   ____ Use inhalers on the day of surgery and bring to hospital day of surgery  ____ Stop Metformin and Janumet 2 days prior to surgery.    ____ Take 1/2 of usual insulin dose the night before surgery and none on the morning surgery      _x___ Follow recommendations from Cardiologist, Pulmonologist or PCP regarding  stopping Aspirin, Coumadin, Plavix ,Eliquis, Effient, or Pradaxa, and Pletal.  X____Stop Anti-inflammatories such as Advil, Aleve, Ibuprofen, Motrin, Naproxen, Naprosyn, Goodies powders or aspirin products. OK to take Tylenol  (STOP MELOXICAM NOW )   _x___ Stop supplements until after surgery.  But may continue Vitamin D, Vitamin B, and multivitamin        ____ Bring C-Pap to the hospital.

## 2017-05-24 NOTE — Telephone Encounter (Signed)
Patient called wanting to know why her disability paperwork stated for her to return to work on 9/10 when she's due to have surgery on 9/25? I told patient that I would get this fixed for her and have it faxed back over. She was thankful and verbalized understanding.

## 2017-05-24 NOTE — Pre-Procedure Instructions (Signed)
Dr. Amie Critchley in to evaluate patient for surgery and reviewed EKG from April 27, 2017 and stated  "ok".

## 2017-05-27 DIAGNOSIS — K651 Peritoneal abscess: Secondary | ICD-10-CM | POA: Diagnosis not present

## 2017-05-27 DIAGNOSIS — K5781 Diverticulitis of intestine, part unspecified, with perforation and abscess with bleeding: Secondary | ICD-10-CM | POA: Diagnosis not present

## 2017-05-30 ENCOUNTER — Telehealth: Payer: Self-pay

## 2017-05-30 NOTE — Telephone Encounter (Signed)
Patient called this morning wanting to know if it was normal for her to be nauseous while taking her antibiotics. I told her that it was. I also recommended for her to drink Ginger Ale since she is on a liquid diet today and she stated that she was already sipping on Sprite. I told her that it was great and to continue sipping on it. Patient was at ease knowing that it's okay to be nauseous on antibiotics. Patient had no further questions.

## 2017-05-31 ENCOUNTER — Inpatient Hospital Stay: Payer: 59 | Admitting: Registered Nurse

## 2017-05-31 ENCOUNTER — Encounter
Admission: RE | Disposition: A | Discharge status: Home / Self Care | Payer: Self-pay | Source: Ambulatory Visit | Attending: General Surgery

## 2017-05-31 ENCOUNTER — Inpatient Hospital Stay
Admission: RE | Admit: 2017-05-31 | Discharge: 2017-06-03 | DRG: 330 | Disposition: A | Discharge status: Home / Self Care | Payer: 59 | Source: Ambulatory Visit | Attending: General Surgery | Admitting: General Surgery

## 2017-05-31 ENCOUNTER — Encounter: Payer: Self-pay | Admitting: *Deleted

## 2017-05-31 ENCOUNTER — Inpatient Hospital Stay: Payer: 59

## 2017-05-31 DIAGNOSIS — Z791 Long term (current) use of non-steroidal anti-inflammatories (NSAID): Secondary | ICD-10-CM

## 2017-05-31 DIAGNOSIS — K5720 Diverticulitis of large intestine with perforation and abscess without bleeding: Secondary | ICD-10-CM | POA: Diagnosis not present

## 2017-05-31 DIAGNOSIS — N74 Female pelvic inflammatory disorders in diseases classified elsewhere: Secondary | ICD-10-CM | POA: Diagnosis present

## 2017-05-31 DIAGNOSIS — T884XXA Failed or difficult intubation, initial encounter: Secondary | ICD-10-CM | POA: Diagnosis present

## 2017-05-31 DIAGNOSIS — E785 Hyperlipidemia, unspecified: Secondary | ICD-10-CM | POA: Diagnosis present

## 2017-05-31 DIAGNOSIS — Z79899 Other long term (current) drug therapy: Secondary | ICD-10-CM

## 2017-05-31 DIAGNOSIS — M069 Rheumatoid arthritis, unspecified: Secondary | ICD-10-CM | POA: Diagnosis present

## 2017-05-31 DIAGNOSIS — Z8249 Family history of ischemic heart disease and other diseases of the circulatory system: Secondary | ICD-10-CM

## 2017-05-31 DIAGNOSIS — E890 Postprocedural hypothyroidism: Secondary | ICD-10-CM | POA: Diagnosis present

## 2017-05-31 DIAGNOSIS — Z85820 Personal history of malignant melanoma of skin: Secondary | ICD-10-CM | POA: Diagnosis not present

## 2017-05-31 DIAGNOSIS — E039 Hypothyroidism, unspecified: Secondary | ICD-10-CM | POA: Diagnosis not present

## 2017-05-31 DIAGNOSIS — Z792 Long term (current) use of antibiotics: Secondary | ICD-10-CM

## 2017-05-31 DIAGNOSIS — Z7952 Long term (current) use of systemic steroids: Secondary | ICD-10-CM

## 2017-05-31 DIAGNOSIS — I10 Essential (primary) hypertension: Secondary | ICD-10-CM | POA: Diagnosis present

## 2017-05-31 DIAGNOSIS — K5792 Diverticulitis of intestine, part unspecified, without perforation or abscess without bleeding: Secondary | ICD-10-CM | POA: Diagnosis not present

## 2017-05-31 DIAGNOSIS — Z298 Encounter for other specified prophylactic measures: Secondary | ICD-10-CM | POA: Diagnosis not present

## 2017-05-31 DIAGNOSIS — F17210 Nicotine dependence, cigarettes, uncomplicated: Secondary | ICD-10-CM | POA: Diagnosis present

## 2017-05-31 DIAGNOSIS — K5780 Diverticulitis of intestine, part unspecified, with perforation and abscess without bleeding: Secondary | ICD-10-CM | POA: Diagnosis not present

## 2017-05-31 HISTORY — PX: LAPAROSCOPIC SIGMOID COLECTOMY: SHX5928

## 2017-05-31 HISTORY — PX: CYSTOSCOPY WITH STENT PLACEMENT: SHX5790

## 2017-05-31 HISTORY — DX: Failed or difficult intubation, initial encounter: T88.4XXA

## 2017-05-31 LAB — POCT I-STAT 4, (NA,K, GLUC, HGB,HCT)
GLUCOSE: 92 mg/dL (ref 65–99)
HCT: 37 % (ref 36.0–46.0)
HEMOGLOBIN: 12.6 g/dL (ref 12.0–15.0)
Potassium: 3.2 mmol/L — ABNORMAL LOW (ref 3.5–5.1)
Sodium: 139 mmol/L (ref 135–145)

## 2017-05-31 SURGERY — COLECTOMY, SIGMOID, LAPAROSCOPIC
Anesthesia: General | Wound class: Clean Contaminated

## 2017-05-31 MED ORDER — DIPHENHYDRAMINE HCL 50 MG/ML IJ SOLN: 25.0000 mg | Freq: Four times a day (QID) | INTRAMUSCULAR | Status: DC | PRN

## 2017-05-31 MED ORDER — FAMOTIDINE 20 MG PO TABS
ORAL_TABLET | ORAL | Status: AC
  Administered 2017-05-31: 20 mg via ORAL
  Filled 2017-05-31: qty 1

## 2017-05-31 MED ORDER — DEXTROSE 5 % IV SOLN
2.0000 g | INTRAVENOUS | Status: AC
  Administered 2017-05-31: 2 g via INTRAVENOUS
  Filled 2017-05-31: qty 2

## 2017-05-31 MED ORDER — BUPIVACAINE HCL (PF) 0.5 % IJ SOLN
INTRAMUSCULAR | Status: AC
  Filled 2017-05-31: qty 30

## 2017-05-31 MED ORDER — ONDANSETRON HCL 4 MG/2ML IJ SOLN
INTRAMUSCULAR | Status: AC
  Filled 2017-05-31: qty 2

## 2017-05-31 MED ORDER — OXYCODONE-ACETAMINOPHEN 5-325 MG PO TABS
1.0000 | ORAL_TABLET | ORAL | Status: DC | PRN
  Administered 2017-05-31 – 2017-06-03 (×11): 2 via ORAL
  Filled 2017-05-31 (×13): qty 2

## 2017-05-31 MED ORDER — FENTANYL CITRATE (PF) 100 MCG/2ML IJ SOLN
INTRAMUSCULAR | Status: AC
Start: 2017-05-31 — End: 2017-05-31
  Filled 2017-05-31: qty 2

## 2017-05-31 MED ORDER — HYDROMORPHONE HCL 1 MG/ML IJ SOLN
1.0000 mg | INTRAMUSCULAR | Status: DC | PRN
  Administered 2017-05-31 – 2017-06-03 (×12): 1 mg via INTRAVENOUS
  Filled 2017-05-31 (×12): qty 1

## 2017-05-31 MED ORDER — PHENYLEPHRINE HCL 10 MG/ML IJ SOLN
INTRAMUSCULAR | Status: DC | PRN
  Administered 2017-05-31 (×3): 100 ug via INTRAVENOUS
  Administered 2017-05-31: 200 ug via INTRAVENOUS
  Administered 2017-05-31 (×2): 100 ug via INTRAVENOUS

## 2017-05-31 MED ORDER — ONDANSETRON HCL 4 MG PO TABS: 4.0000 mg | ORAL_TABLET | Freq: Four times a day (QID) | ORAL | Status: DC | PRN

## 2017-05-31 MED ORDER — MIDAZOLAM HCL 2 MG/2ML IJ SOLN
INTRAMUSCULAR | Status: DC | PRN
  Administered 2017-05-31: 2 mg via INTRAVENOUS

## 2017-05-31 MED ORDER — FENTANYL CITRATE (PF) 100 MCG/2ML IJ SOLN
25.0000 ug | INTRAMUSCULAR | Status: AC | PRN
  Administered 2017-05-31 (×6): 25 ug via INTRAVENOUS

## 2017-05-31 MED ORDER — SUCCINYLCHOLINE CHLORIDE 20 MG/ML IJ SOLN
INTRAMUSCULAR | Status: AC
  Filled 2017-05-31: qty 1

## 2017-05-31 MED ORDER — HYDROMORPHONE HCL 1 MG/ML IJ SOLN
0.5000 mg | INTRAMUSCULAR | Status: AC | PRN
  Administered 2017-05-31 (×4): 0.5 mg via INTRAVENOUS

## 2017-05-31 MED ORDER — DIPHENHYDRAMINE HCL 25 MG PO CAPS: 25.0000 mg | ORAL_CAPSULE | Freq: Four times a day (QID) | ORAL | Status: DC | PRN

## 2017-05-31 MED ORDER — PROPOFOL 10 MG/ML IV BOLUS
INTRAVENOUS | Status: AC
  Filled 2017-05-31: qty 20

## 2017-05-31 MED ORDER — SUGAMMADEX SODIUM 500 MG/5ML IV SOLN
INTRAVENOUS | Status: DC | PRN
  Administered 2017-05-31: 400 mg via INTRAVENOUS

## 2017-05-31 MED ORDER — LACTATED RINGERS IV SOLN
INTRAVENOUS | Status: DC
  Administered 2017-05-31: 200 mL/h via INTRAVENOUS
  Administered 2017-05-31 (×2): via INTRAVENOUS

## 2017-05-31 MED ORDER — ROCURONIUM BROMIDE 50 MG/5ML IV SOLN
INTRAVENOUS | Status: AC
  Filled 2017-05-31: qty 1

## 2017-05-31 MED ORDER — IOTHALAMATE MEGLUMINE 43 % IV SOLN
INTRAVENOUS | Status: DC | PRN
  Administered 2017-05-31: 25 mL

## 2017-05-31 MED ORDER — SUGAMMADEX SODIUM 500 MG/5ML IV SOLN
INTRAVENOUS | Status: AC
  Filled 2017-05-31: qty 5

## 2017-05-31 MED ORDER — GLYCOPYRROLATE 0.2 MG/ML IJ SOLN
INTRAMUSCULAR | Status: AC
  Filled 2017-05-31: qty 1

## 2017-05-31 MED ORDER — ACETAMINOPHEN 10 MG/ML IV SOLN
INTRAVENOUS | Status: DC | PRN
  Administered 2017-05-31: 1000 mg via INTRAVENOUS

## 2017-05-31 MED ORDER — FENTANYL CITRATE (PF) 100 MCG/2ML IJ SOLN
INTRAMUSCULAR | Status: AC
  Administered 2017-05-31: 25 ug via INTRAVENOUS
  Filled 2017-05-31: qty 2

## 2017-05-31 MED ORDER — FENTANYL CITRATE (PF) 250 MCG/5ML IJ SOLN
INTRAMUSCULAR | Status: AC
  Filled 2017-05-31: qty 5

## 2017-05-31 MED ORDER — CHLORHEXIDINE GLUCONATE CLOTH 2 % EX PADS: 6.0000 | MEDICATED_PAD | Freq: Once | CUTANEOUS | Status: DC

## 2017-05-31 MED ORDER — EPHEDRINE SULFATE 50 MG/ML IJ SOLN
INTRAMUSCULAR | Status: AC
  Filled 2017-05-31: qty 1

## 2017-05-31 MED ORDER — FAMOTIDINE 20 MG PO TABS
20.0000 mg | ORAL_TABLET | Freq: Once | ORAL | Status: AC
  Administered 2017-05-31: 20 mg via ORAL

## 2017-05-31 MED ORDER — LACTATED RINGERS IV SOLN
INTRAVENOUS | Status: DC
  Administered 2017-05-31 – 2017-06-02 (×4): via INTRAVENOUS

## 2017-05-31 MED ORDER — ACETAMINOPHEN 10 MG/ML IV SOLN
INTRAVENOUS | Status: AC
  Filled 2017-05-31: qty 100

## 2017-05-31 MED ORDER — MEROPENEM 1 G IV SOLR
1.0000 g | Freq: Three times a day (TID) | INTRAVENOUS | Status: DC
  Administered 2017-05-31 – 2017-06-02 (×6): 1 g via INTRAVENOUS
  Filled 2017-05-31 (×9): qty 1

## 2017-05-31 MED ORDER — PROPOFOL 10 MG/ML IV BOLUS
INTRAVENOUS | Status: DC | PRN
  Administered 2017-05-31: 50 mg via INTRAVENOUS
  Administered 2017-05-31: 150 mg via INTRAVENOUS

## 2017-05-31 MED ORDER — DEXAMETHASONE SODIUM PHOSPHATE 10 MG/ML IJ SOLN
INTRAMUSCULAR | Status: DC | PRN
  Administered 2017-05-31: 10 mg via INTRAVENOUS

## 2017-05-31 MED ORDER — LIDOCAINE HCL (PF) 1 % IJ SOLN
INTRAMUSCULAR | Status: DC | PRN
  Administered 2017-05-31: 3 mL
  Administered 2017-05-31: 8 mL

## 2017-05-31 MED ORDER — ONDANSETRON HCL 4 MG/2ML IJ SOLN: 4.0000 mg | Freq: Four times a day (QID) | INTRAMUSCULAR | Status: DC | PRN

## 2017-05-31 MED ORDER — ALUM & MAG HYDROXIDE-SIMETH 200-200-20 MG/5ML PO SUSP: 30.0000 mL | Freq: Four times a day (QID) | ORAL | Status: DC | PRN

## 2017-05-31 MED ORDER — ROCURONIUM BROMIDE 100 MG/10ML IV SOLN
INTRAVENOUS | Status: DC | PRN
Start: 2017-05-31 — End: 2017-05-31
  Administered 2017-05-31: 20 mg via INTRAVENOUS
  Administered 2017-05-31 (×2): 50 mg via INTRAVENOUS
  Administered 2017-05-31: 30 mg via INTRAVENOUS

## 2017-05-31 MED ORDER — ONDANSETRON HCL 4 MG/2ML IJ SOLN: 4.0000 mg | Freq: Once | INTRAMUSCULAR | Status: DC | PRN

## 2017-05-31 MED ORDER — ALVIMOPAN 12 MG PO CAPS
ORAL_CAPSULE | ORAL | Status: AC
  Administered 2017-05-31: 12 mg
  Filled 2017-05-31: qty 1

## 2017-05-31 MED ORDER — LIDOCAINE HCL (PF) 2 % IJ SOLN
INTRAMUSCULAR | Status: AC
  Filled 2017-05-31: qty 6

## 2017-05-31 MED ORDER — HYDRALAZINE HCL 20 MG/ML IJ SOLN: 10.0000 mg | INTRAMUSCULAR | Status: DC | PRN

## 2017-05-31 MED ORDER — DEXAMETHASONE SODIUM PHOSPHATE 10 MG/ML IJ SOLN
INTRAMUSCULAR | Status: AC
  Filled 2017-05-31: qty 1

## 2017-05-31 MED ORDER — SODIUM CHLORIDE FLUSH 0.9 % IV SOLN
INTRAVENOUS | Status: AC
  Filled 2017-05-31: qty 10

## 2017-05-31 MED ORDER — ONDANSETRON HCL 4 MG/2ML IJ SOLN
INTRAMUSCULAR | Status: DC | PRN
  Administered 2017-05-31: 4 mg via INTRAVENOUS

## 2017-05-31 MED ORDER — PHENYLEPHRINE HCL 10 MG/ML IJ SOLN
INTRAMUSCULAR | Status: AC
  Filled 2017-05-31: qty 1

## 2017-05-31 MED ORDER — BUPIVACAINE HCL (PF) 0.5 % IJ SOLN
INTRAMUSCULAR | Status: DC | PRN
  Administered 2017-05-31: 8 mL
  Administered 2017-05-31: 3 mL

## 2017-05-31 MED ORDER — MEROPENEM IV (FOR PTA / DISCHARGE USE ONLY): 1.0000 g | Freq: Three times a day (TID) | INTRAVENOUS | Status: DC

## 2017-05-31 MED ORDER — HYDROMORPHONE HCL 1 MG/ML IJ SOLN
INTRAMUSCULAR | Status: AC
  Administered 2017-05-31: 0.5 mg via INTRAVENOUS
  Filled 2017-05-31: qty 1

## 2017-05-31 MED ORDER — MIDAZOLAM HCL 2 MG/2ML IJ SOLN
INTRAMUSCULAR | Status: AC
  Filled 2017-05-31: qty 2

## 2017-05-31 MED ORDER — ENOXAPARIN SODIUM 40 MG/0.4ML ~~LOC~~ SOLN
40.0000 mg | SUBCUTANEOUS | Status: DC
  Administered 2017-06-01 – 2017-06-02 (×2): 40 mg via SUBCUTANEOUS
  Filled 2017-05-31 (×3): qty 0.4

## 2017-05-31 MED ORDER — LIDOCAINE HCL (CARDIAC) 20 MG/ML IV SOLN
INTRAVENOUS | Status: DC | PRN
  Administered 2017-05-31: 100 mg via INTRAVENOUS

## 2017-05-31 MED ORDER — FENTANYL CITRATE (PF) 100 MCG/2ML IJ SOLN
INTRAMUSCULAR | Status: DC | PRN
  Administered 2017-05-31 (×2): 50 ug via INTRAVENOUS
  Administered 2017-05-31: 100 ug via INTRAVENOUS
  Administered 2017-05-31 (×3): 50 ug via INTRAVENOUS

## 2017-05-31 MED ORDER — LIDOCAINE HCL (PF) 1 % IJ SOLN
INTRAMUSCULAR | Status: AC
  Filled 2017-05-31: qty 30

## 2017-05-31 SURGICAL SUPPLY — 87 items
ADAPTER GOLDBERG URETERAL (ADAPTER) ×4 IMPLANT
ADHESIVE MASTISOL STRL (MISCELLANEOUS) ×4 IMPLANT
BAG DRAIN CYSTO-URO LG1000N (MISCELLANEOUS) ×4 IMPLANT
BLADE SURG SZ10 CARB STEEL (BLADE) ×4 IMPLANT
CANISTER SUCT 1200ML W/VALVE (MISCELLANEOUS) ×4 IMPLANT
CATH FOLEY 2WAY  5CC 16FR (CATHETERS) ×2
CATH URETL 5X70 OPEN END (CATHETERS) IMPLANT
CATH URTH 16FR FL 2W BLN LF (CATHETERS) ×2 IMPLANT
CHLORAPREP W/TINT 26ML (MISCELLANEOUS) ×4 IMPLANT
CLIP TI LARGE 6 (CLIP) IMPLANT
CLIP TI MEDIUM 6 (CLIP) IMPLANT
CLOSURE WOUND 1/2 X4 (GAUZE/BANDAGES/DRESSINGS) ×1
CONRAY 43 FOR UROLOGY 50M (MISCELLANEOUS) ×4 IMPLANT
DRAPE LEGGINS SURG 28X43 STRL (DRAPES) ×4 IMPLANT
DRSG OPSITE POSTOP 3X4 (GAUZE/BANDAGES/DRESSINGS) ×16 IMPLANT
DRSG OPSITE POSTOP 4X6 (GAUZE/BANDAGES/DRESSINGS) ×4 IMPLANT
ELECT BLADE 6.5 EXT (BLADE) ×4 IMPLANT
ELECT CAUTERY BLADE 6.4 (BLADE) ×4 IMPLANT
ELECT REM PT RETURN 9FT ADLT (ELECTROSURGICAL) ×4
ELECTRODE REM PT RTRN 9FT ADLT (ELECTROSURGICAL) ×2 IMPLANT
GAUZE SPONGE 4X4 12PLY STRL (GAUZE/BANDAGES/DRESSINGS) ×4 IMPLANT
GLOVE BIO SURGEON STRL SZ 6.5 (GLOVE) ×3 IMPLANT
GLOVE BIO SURGEON STRL SZ7.5 (GLOVE) ×16 IMPLANT
GLOVE BIO SURGEONS STRL SZ 6.5 (GLOVE) ×1
GLOVE INDICATOR 8.0 STRL GRN (GLOVE) ×12 IMPLANT
GLOVE PROTEXIS LATEX SZ 7.5 (GLOVE) ×8 IMPLANT
GOWN STRL REUS W/ TWL LRG LVL3 (GOWN DISPOSABLE) ×16 IMPLANT
GOWN STRL REUS W/TWL LRG LVL3 (GOWN DISPOSABLE) ×16
HANDLE YANKAUER SUCT BULB TIP (MISCELLANEOUS) ×8 IMPLANT
HOLDER FOLEY CATH W/STRAP (MISCELLANEOUS) ×4 IMPLANT
IRRIGATION STRYKERFLOW (MISCELLANEOUS) ×2 IMPLANT
IRRIGATOR STRYKERFLOW (MISCELLANEOUS) ×4
IV NS 1000ML (IV SOLUTION) ×2
IV NS 1000ML BAXH (IV SOLUTION) ×2 IMPLANT
KIT RM TURNOVER CYSTO AR (KITS) ×4 IMPLANT
KIT RM TURNOVER STRD PROC AR (KITS) ×4 IMPLANT
KIT URETTERAL LIGHTED STENTS (STENTS) ×4 IMPLANT
LABEL OR SOLS (LABEL) IMPLANT
LIGASURE BLUNT 5MM 37CM (INSTRUMENTS) ×4 IMPLANT
NEEDLE HYPO 25X1 1.5 SAFETY (NEEDLE) ×4 IMPLANT
NEEDLE VERESS 14GA 120MM (NEEDLE) ×4 IMPLANT
NS IRRIG 1000ML POUR BTL (IV SOLUTION) ×4 IMPLANT
PACK COLON CLEAN CLOSURE (MISCELLANEOUS) ×4 IMPLANT
PACK CYSTO AR (MISCELLANEOUS) ×4 IMPLANT
PACK LAP CHOLECYSTECTOMY (MISCELLANEOUS) ×4 IMPLANT
PAD PREP 24X41 OB/GYN DISP (PERSONAL CARE ITEMS) IMPLANT
PENCIL ELECTRO HAND CTR (MISCELLANEOUS) ×4 IMPLANT
RELOAD STAPLER BLUE 60MM (STAPLE) ×6 IMPLANT
RELOAD STAPLER WHITE 60MM (STAPLE) IMPLANT
RETRACTOR WOUND ALXS 18CM MED (MISCELLANEOUS) ×4 IMPLANT
RTRCTR WOUND ALEXIS O 18CM MED (MISCELLANEOUS) ×8
SCISSORS METZENBAUM CVD 33 (INSTRUMENTS) IMPLANT
SCRUB POVIDONE IODINE 4 OZ (MISCELLANEOUS) IMPLANT
SENSORWIRE 0.038 NOT ANGLED (WIRE) ×4
SET CYSTO W/LG BORE CLAMP LF (SET/KITS/TRAYS/PACK) ×4 IMPLANT
SLEEVE ENDOPATH XCEL 5M (ENDOMECHANICALS) ×12 IMPLANT
SOL .9 NS 3000ML IRR  AL (IV SOLUTION) ×2
SOL .9 NS 3000ML IRR UROMATIC (IV SOLUTION) ×2 IMPLANT
SOL PREP PVP 2OZ (MISCELLANEOUS) ×4
SOLUTION PREP PVP 2OZ (MISCELLANEOUS) ×2 IMPLANT
SPONGE LAP 18X18 5 PK (GAUZE/BANDAGES/DRESSINGS) ×4 IMPLANT
STAPLE ECHEON FLEX 60 POW ENDO (STAPLE) IMPLANT
STAPLER ECHELON LONG 60 440 (INSTRUMENTS) ×4 IMPLANT
STAPLER PROX 25M (MISCELLANEOUS) ×4 IMPLANT
STAPLER RELOAD BLUE 60MM (STAPLE) ×12
STAPLER RELOAD WHITE 60MM (STAPLE)
STAPLER SKIN PROX 35W (STAPLE) IMPLANT
STRIP CLOSURE SKIN 1/2X4 (GAUZE/BANDAGES/DRESSINGS) ×3 IMPLANT
SURGILUBE 2OZ TUBE FLIPTOP (MISCELLANEOUS) ×8 IMPLANT
SUT MNCRL 4-0 (SUTURE)
SUT MNCRL 4-0 27XMFL (SUTURE)
SUT PROLENE 3 0 SH DA (SUTURE) ×4 IMPLANT
SUT SILK 3-0 (SUTURE) ×2
SUT SILK 3-0 SH-1 18XCR BRD (SUTURE) ×2
SUT VIC AB 2-0 CT1 27 (SUTURE)
SUT VIC AB 2-0 CT1 TAPERPNT 27 (SUTURE) IMPLANT
SUTURE MNCRL 4-0 27XMF (SUTURE) IMPLANT
SUTURE SILK 3-0 SH-1 18XCR BRD (SUTURE) ×2 IMPLANT
SYR 10ML LL (SYRINGE) ×4 IMPLANT
SYR BULB IRRIG 60ML STRL (SYRINGE) ×4 IMPLANT
SYRINGE IRR TOOMEY STRL 70CC (SYRINGE) ×4 IMPLANT
TRAY FOLEY W/METER SILVER 16FR (SET/KITS/TRAYS/PACK) IMPLANT
TROCAR XCEL 12X100 BLDLESS (ENDOMECHANICALS) ×4 IMPLANT
TROCAR XCEL NON-BLD 5MMX100MML (ENDOMECHANICALS) ×4 IMPLANT
TUBING INSUF HEATED (TUBING) ×4 IMPLANT
WATER STERILE IRR 1000ML POUR (IV SOLUTION) ×4 IMPLANT
WIRE SENSOR 0.038 NOT ANGLED (WIRE) ×2 IMPLANT

## 2017-05-31 NOTE — H&P (View-Only) (Signed)
Outpatient Surgical Follow Up  05/18/2017  Rebecca Obrien is an 51 y.o. female.   Chief Complaint  Patient presents with  . Follow-up    Pelvic Abscess-Discuss Ct scan results from 05/16/17    HPI:  51 year old female who is well known to the surgery service, returns to clinic for follow up of her recent CT scan that was performed due to pelvic abscess secondary to diverticulitis. Patient reports that she continues to have intermittent left lower quadrant abdominal pain. It is tolerable and not near as severe as when she was in the hospital. Does not stop her from doing what she wants to do and should does not require her to take pain medication. She has been eating well and having normal bowel function. She is still on IV Merrem for antibiotics. She denies any current fevers, chills, nausea, vomiting, chest pain, shortness of breath, diarrhea, constipation.  Past Medical History:  Diagnosis Date  . Arthritis    RA  . Cancer ([place])    skin melanoma  . Collagen vascular disease ([facility])    RA  . Difficult airway   . Hyperlipidemia   . Hypertension   . Hyperthyroidism   . Hypokalemia   . Obesity   . Rheumatoid arthritis(714.0)   . Stroke [facility])     Past Surgical History:  Procedure Laterality Date  . ABDOMINAL HYSTERECTOMY    . CARPAL TUNNEL RELEASE  Bilateral  . CHOLECYSTECTOMY    . TEE WITHOUT CARDIOVERSION  07/26/2012   Procedure: TRANSESOPHAGEAL ECHOCARDIOGRAM (TEE);  Surgeon: Thayer Headings, MD;  Location: [facility] ENDOSCOPY;  Service: Cardiovascular;  Laterality: N/A;    Family History  Problem Relation Age of Onset  . Nephrolithiasis Mother   . Hypertension Mother   . Hypertension Father   . Heart failure Father   . Cardiomyopathy Brother     Social History:  reports that she has been smoking Cigarettes.  She has a 23.00 pack-year smoking history. She has never used smokeless tobacco. She reports that she does not drink alcohol or use drugs.  Allergies:  Allergies   Allergen Reactions  . Latex Dermatitis    With prolonged use     Medications reviewed. Allergies as of 05/18/2017      Reactions   Latex Dermatitis   With prolonged use       Medication List       Accurate as of 05/18/17 10:21 AM. Always use your most recent med list.          bisacodyl 5 MG EC tablet Commonly known as:  DULCOLAX Take 1 tablet (5 mg total) by mouth once.   erythromycin base 500 MG tablet Commonly known as:  E-MYCIN Take 2 tablets (1,000 mg total) by mouth 3 (three) times daily.   hydrochlorothiazide 12.5 MG capsule Commonly known as:  MICROZIDE Take 1 capsule by mouth daily.   leflunomide 20 MG tablet Commonly known as:  ARAVA Take 1 tablet by mouth daily.   meloxicam 15 MG tablet Commonly known as:  MOBIC Take 1 tablet by mouth daily.   neomycin 500 MG tablet Commonly known as:  MYCIFRADIN Take 2 tablets (1,000 mg total) by mouth 3 (three) times daily.   oxyCODONE-acetaminophen 5-325 MG tablet Commonly known as:  PERCOCET/ROXICET Take 1 tablet by mouth every 4 (four) hours as needed for severe pain.   polyethylene glycol powder powder Commonly known as:  GLYCOLAX/MIRALAX Take 255 g by mouth once.   potassium chloride 10 MEQ tablet Commonly  known as:  K-DUR Take 2 tablets by mouth 2 (two) times daily.   predniSONE 5 MG tablet Commonly known as:  DELTASONE Take 4 tablets by mouth daily at 8 pm.   Tofacitinib Citrate 11 MG Tb24 Take 11 mg by mouth daily.            Discharge Care Instructions        Start     Ordered   05/18/17 0000  bisacodyl (DULCOLAX) 5 MG EC tablet   Once     05/18/17 1012   05/18/17 0000  polyethylene glycol powder (GLYCOLAX/MIRALAX) powder   Once     05/18/17 1012   05/18/17 0000  neomycin (MYCIFRADIN) 500 MG tablet  3 times daily     05/18/17 1012   05/18/17 0000  erythromycin base (E-MYCIN) 500 MG tablet  3 times daily     05/18/17 1012        ROS A multipoint review of systems was  completed, all pertinent positives and negatives are documented within the history of present illness and remainder are negative   BP (!) 151/94   Pulse 90   Temp 98.2 F (36.8 C) (Oral)   Ht 5\' 4"  (1.626 m)   Wt 77.2 kg (170 lb 3.2 oz)   BMI 29.21 kg/m   Physical Exam Gen.: No acute distress Neck: Supple and nontender. Well-healed thyroidectomy incision site. Lymph nodes: No evidence of lymphadenopathy Chest: Clear to auscultation Heart: Regular rate and rhythm Abdomen: Soft, minimally percent tender to deep palpation in the left lower quadrant, nondistended. Extremities: Moves all extremities well. Skin: Multiple areas of skin discoloration but without obvious mass, lump, pathologic wounds. Neuro: Cranial nerves II through XII grossly intact Psych: Patient is appropriate, alert and oriented.    No results found for this or any previous visit (from the past 48 hour(s)). No results found.  Assessment/Plan:  1. Diverticulitis of large intestine with abscess without bleeding 51 year old female with recurrent sigmoid diverticulitis with abscess. Recent CT scan reviewed with the patient which shows a persistent left lower quadrant abscess area did although the abscess is smaller it is still present. Discussed with the patient given the persistence of the abscess that in this time to discuss scheduling surgery to remove the area of the colon that is causing the infection. Described at length the procedure of a laparoscopic sigmoid colectomy. Discussed the risks, benefits, alternatives. The primary risks are of need to convert to an open procedure, anastomotic leak requiring ostomy, damage to the surrounding structures such as the ureter small bowel, need for prolonged hospital stay should there be any further complications. Patient voiced understanding. She no she is at higher risk from surgery due to her multiple other medical problems including her prior melanoma, prior thyroidectomy,  prior abdominal surgeries. Patient voiced understanding and desires to proceed.  Plan for laparoscopic sigmoid colectomy on Tuesday, September 25. A total of 40 minutes was used on this encounter greater than 50% of a user counseling and coordination of care.  Clayburn Pert, MD FACS General Surgeon  05/18/2017,10:16 AM

## 2017-05-31 NOTE — Transfer of Care (Signed)
Immediate Anesthesia Transfer of Care Note  Patient: Rebecca Obrien  Procedure(s) Performed: Procedure(s): LAPAROSCOPIC SIGMOID COLECTOMY (N/A) CYSTOSCOPY WITH STENT PLACEMENT- LIGHTED STENTS (Bilateral)  Patient Location: PACU  Anesthesia Type:General  Level of Consciousness: drowsy and patient cooperative  Airway & Oxygen Therapy: Patient Spontanous Breathing and Patient connected to face mask oxygen  Post-op Assessment: Report given to RN and Post -op Vital signs reviewed and stable  Post vital signs: Reviewed and stable  Last Vitals:  Vitals:   05/31/17 0808 05/31/17 1334  BP: (!) 144/94 (!) 155/78  Pulse: 97 94  Resp: 16 13  Temp:  36.5 C  SpO2: 99% 99%    Last Pain:  Vitals:   05/31/17 0808  TempSrc: Tympanic  PainSc: 0-No pain         Complications: No apparent anesthesia complications

## 2017-05-31 NOTE — Interval H&P Note (Signed)
History and Physical Interval Note:  05/31/2017 8:21 AM  Rebecca Obrien  has presented today for surgery, with the diagnosis of DIVERTICULITIS WITH ABSCESS  The various methods of treatment have been discussed with the patient and family. After consideration of risks, benefits and other options for treatment, the patient has consented to  Procedure(s): LAPAROSCOPIC SIGMOID COLECTOMY (N/A) CYSTOSCOPY WITH STENT PLACEMENT- LIGHTED STENTS (Bilateral) as a surgical intervention .  The patient's history has been reviewed, patient examined, no change in status, stable for surgery.  I have reviewed the patient's chart and labs.  Questions were answered to the patient's satisfaction.     Clayburn Pert

## 2017-05-31 NOTE — Op Note (Signed)
Pre-operative Diagnosis:sigmoid diverticulitis with abscess  Post-operative Diagnosis: same  . Performed: Laparoscopic sigmoid colectomy with immediate anastomosis, mobilization of splenic flexure  Surgeon: Clayburn Pert   Assistants: Dr. Graciela Husbands  Anesthesia: General endotracheal anesthesia  ASA Class: 2  Surgeon: Clayburn Pert, MD FACS  Anesthesia: Gen. with endotracheal tube  Assistant:PA student  Procedure Details  The patient was seen again in the Holding Room. The benefits, complications, treatment options, and expected outcomes were discussed with the patient. The risks of bleeding, infection, recurrence of symptoms, failure to resolve symptoms,  bowel injury, any of which could require further surgery were reviewed with the patient.   The patient was taken to Operating Room, identified as Rebecca Obrien and the procedure verified.  A Time Out was held and the above information confirmed.  Prior to the induction of general anesthesia, antibiotic prophylaxis was administered. VTE prophylaxis was in place. General endotracheal anesthesia was then administered and tolerated well. After the induction, the abdomen was prepped with Chloraprep, the perineum was prepped with Betadine and draped in the sterile fashion. The patient was positioned in the low lithotomy position.  The procedure began with a cystoscopy with placement of ureteral stents that we dictated by Dr. Erlene Quan and a separate dictation.  The patient was then reprepped and redraped as above. The procedure began with a right upper quadrant Veress needle approach. An area 2 fingers below the costal margin in the midclavicular line was chosen and localized with a 50-50 mixer 1% lidocaine and 0.5% Marcaine plain. An incision was made and a Veress needle was used to obtain a pneumoperitoneum up to 15 mmHg. A 5 mm Optiview trocar was then placed in the abdomen visualizing all layers and easily accessing the  peritoneal cavity. There is no evidence of damage to the deeper structure any structures from either the Veress needle or the Optiview trocar. The patient was noted to have low midline adhesions from her prior surgeries and her diverticulitis.A periumbilical trocarand right lower quadrant trochars were placed under direct vision being a 5 mm and 12 mm directly. This allowed the adhesions be taken down using a LigaSure device. Once they were adequate freed from the anterior abdominal wall a low midline 5 mm trocar was placed. These were all localized with the aforementioned local anesthetic.  The patient was then placed head down and the remainder of the adhesions to the anterior abdominal wall were easily taken down. The extensive inflammatory reaction to the sigmoid colon with lateral abscess to the abdominal wall was easily identified. It was difficult to dissect of the abscess cavity was entered into and removed via suction area. The majority of the dissection was undertaken bluntly with a suction irrigator and sharply with the LigaSure device and scissors.nce the abscess cavity was freed from the anterior abdominal and lateral sidewalls of thepelvic brim the remainder of the sigmoid colon was able to be visualized and dissected free from its flimsy attachments from inflammation. The line of Toldt was then mobilized with LigaSure device.  Next we then elevated the sigmoid colon to allow a mesenteric window to be created. n order to obtain the required retraction and additional 5 mm trocar was placed in the left midabdomen.This was all done with the LigaSure device the mesentery was taken down along the sigmoid colon down to the pelvic reflection without any difficulty. The previously placed ureteral stents were never visualized during the case.once the distal dissection was completed a 60 mm blue load powered  stapler was brought to the field and easily crossed the entire distal colon. A small remainder of  colon had to be taken with a second load At this point it was easily identified that there was not enough mobility and the decision was made to release the splenic flexure.This was taken down with LigaSure device.  At this point low midline incision stemming from the 5 mm trocar in the low midline. This was opened sharply with a 15 blade scalpel and then with electrocautery. With the fascia opened a Alexis wound protector was easily placed in the abdomen. The sigmoid colon was able to delivered through this. The proximal end was then excised with the aforementioned stapler. The remaining staple line was thenpened sharply and using EEA sizers was found to only accept a 25 mm stapler. A pursestring 3-0 Prolene was placed followed by the anvil. The anvil was then returned to the patient's abdomen and a pneumoperitoneum was reestablished.   I then wentAt this point I proceeded to the rectum and performed a digital rectal exam followed by dilatation with the sizer and then inserted the 25 mm stapler through the rectumwhich was easily advanced to the staple line. The spike was advanced and married to the anvil and was closed under direct visualization and fired. 2 complete doughnuts were able to removed from the stapler A leak test was performed under irrigation fluid with no evidence of air leak to the anastomosis.  We then proceeded to use a clean closure technique. The pneumoperitoneum was released and all of our operative assurance for trade out for sterile assurance after the patient was redraped. All operative team members broke scrub and returned the operative field after re-scrubbing to fresh gowns and gloves. The low midline incision was closed with a running #1 PDS suture at the fascia. The skin of all the operative sites were then closed with surgical staple Honeycomb dressings were placed to all of the operative sites.one of the 2 stents was removed at the end of the case prior to her being awen from  anesthesia.  Are correct at the end of procedure. There were no immediate complications. The patt was awoken from general endotracheal anesthesia and transferred to the PACU in good condition.  Findings:Sigmoid diverticulitis   Estimated Blood Loss: 200 mL         Drains: None         Specimens: Sigmoid Colon          Complications: None                   Condition: Good   Clayburn Pert, MD, FACS

## 2017-05-31 NOTE — Anesthesia Procedure Notes (Signed)
Procedure Name: Intubation Date/Time: 05/31/2017 9:26 AM Performed by: Doreen Salvage Pre-anesthesia Checklist: Patient identified, Patient being monitored, Timeout performed, Emergency Drugs available and Suction available Patient Re-evaluated:Patient Re-evaluated prior to induction Oxygen Delivery Method: Circle system utilized Preoxygenation: Pre-oxygenation with 100% oxygen Induction Type: IV induction and Cricoid Pressure applied Ventilation: Mask ventilation without difficulty Laryngoscope Size: Mac, 3 and McGraph Grade View: Grade III Tube type: Oral Tube size: 7.0 mm Number of attempts: 1 Airway Equipment and Method: Stylet Placement Confirmation: ETT inserted through vocal cords under direct vision,  positive ETCO2 and breath sounds checked- equal and bilateral Secured at: 21 cm Tube secured with: Tape Dental Injury: Teeth and Oropharynx as per pre-operative assessment  Difficulty Due To: Difficulty was anticipated, Difficult Airway- due to limited oral opening and Difficult Airway- due to anterior larynx Comments: Very anterior airway. Grade 3 view with Mcgrath. Cricoid pressure utilized to view cords

## 2017-05-31 NOTE — Op Note (Signed)
Date of procedure: 05/31/17  Preoperative diagnosis:  1.  Sigmoid diverticulitis with abscess  Postoperative diagnosis:  1. Same as above   Procedure: 1. Cystoscopy 2. Bilateral retrograde pyelogram 3. Bilateral ureteral placement  Surgeon: Hollice Espy, MD  Anesthesia: General  Complications: None  Intraoperative findings: Uncomplicated preop ureteral stent placement  EBL: minimal  Specimens: None  Drains: Lighted Stryker ureteral stent 2, 16 French Foley catheter  Indication: Rebecca Obrien is a 51 y.o. patient with diverticulitis with abscess undergoing laparoscopic sigmoid colectomy with Dr. Adonis Huguenin. Have been asked to place preoperative ureteral stents for the purpose of ureteral identification intraoperatively.  After reviewing the management options for treatment, she elected to proceed with the above surgical procedure(s). We have discussed the potential benefits and risks of the procedure, side effects of the proposed treatment, the likelihood of the patient achieving the goals of the procedure, and any potential problems that might occur during the procedure or recuperation. Informed consent has been obtained.  Description of procedure:  The patient was taken to the operating room and general anesthesia was induced.  The patient was placed in the dorsal lithotomy position, prepped and draped in the usual sterile fashion, and preoperative antibiotics were administered. A preoperative time-out was performed.   A 21 French cystoscope was advanced per urethra into the bladder. Attention was turned to the right ureteral orifice which was cannulated using the Stryker open-ended ureteral stent which was placed just at the mouth of the UO. Contrast was injected creating a roadmap of the ureter and renal pelvis. This is unremarkable exam. The wire was placed up to level of the kidney. The open-ended ureteral catheter was then advanced over this wire up to level of the renal  pelvis. The wire was withdrawn and the scope was removed leaving the open-ended ureteral catheter in place. The same exact procedure was then performed on the left side. This retrograde pocket was also unremarkable. The open-ended ureteral catheter was then placed over a wire to satisfactory position. Finally, the bladder was drained, 16 French Foley catheter was placed in the balloon was filled with 10 cc of sterile water. The ligating fibers were then placed in each open-ended ureteral catheter. Each of the stents were tied to the Foley catheter using silk ties. An adapter was used to attach the Foley bag and stents.  At this point in time, the procedure was turned over to Dr. Adonis Huguenin. Please see his note for further details.   Plan: 1 stent will be removed at the end of the procedure and the following stent tomorrow morning as long as her creatinine remained stable. This was discussed with Dr. Adonis Huguenin.  Hollice Espy, M.D.

## 2017-05-31 NOTE — Anesthesia Post-op Follow-up Note (Signed)
Anesthesia QCDR form completed.        

## 2017-05-31 NOTE — Anesthesia Postprocedure Evaluation (Signed)
Anesthesia Post Note  Patient: Rebecca Obrien  Procedure(s) Performed: Procedure(s) (LRB): LAPAROSCOPIC SIGMOID COLECTOMY (N/A) CYSTOSCOPY WITH STENT PLACEMENT- LIGHTED STENTS (Bilateral)  Patient location during evaluation: PACU Anesthesia Type: General Level of consciousness: awake and alert Pain management: pain level controlled Vital Signs Assessment: post-procedure vital signs reviewed and stable Respiratory status: spontaneous breathing and respiratory function stable Cardiovascular status: stable Anesthetic complications: no     Last Vitals:  Vitals:   05/31/17 1450 05/31/17 1456  BP: (!) 142/86 (!) 144/89  Pulse: 81 83  Resp: 11 12  Temp:  36.6 C  SpO2: 96% 96%    Last Pain:  Vitals:   05/31/17 1527  TempSrc:   PainSc: 10-Worst pain ever                 Bard Haupert K

## 2017-05-31 NOTE — Brief Op Note (Signed)
05/31/2017  1:26 PM  PATIENT:  Rebecca Obrien  51 y.o. female  PRE-OPERATIVE DIAGNOSIS:  DIVERTICULITIS WITH ABSCESS  POST-OPERATIVE DIAGNOSIS:  DIVERTICULITIS WITH ABSCESS  PROCEDURE:  Procedure(s): LAPAROSCOPIC SIGMOID COLECTOMY (N/A) CYSTOSCOPY WITH STENT PLACEMENT- LIGHTED STENTS (Bilateral)  SURGEON:  Surgeon(s) and Role: Panel 1:    * Clayburn Pert, MD - Primary    * Nestor Lewandowsky, MD - Assisting  Panel 2:    * Hollice Espy, MD - Primary  PHYSICIAN ASSISTANT:   ASSISTANTS: none   ANESTHESIA:   general  EBL:  Total I/O In: 2000 [I.V.:2000] Out: 200 [Blood:200]  BLOOD ADMINISTERED:none  DRAINS: none   LOCAL MEDICATIONS USED:  MARCAINE    and XYLOCAINE   SPECIMEN:  Source of Specimen:  sigmoid colon  DISPOSITION OF SPECIMEN:  PATHOLOGY  COUNTS:  YES  TOURNIQUET:  * No tourniquets in log *  DICTATION: .Dragon Dictation  PLAN OF CARE: Admit to inpatient   PATIENT DISPOSITION:  PACU - hemodynamically stable.   Delay start of Pharmacological VTE agent (>24hrs) due to surgical blood loss or risk of bleeding: no

## 2017-05-31 NOTE — Plan of Care (Signed)
Problem: Pain Managment: Goal: General experience of comfort will improve Outcome: Progressing Pt given meds to control surgical pain

## 2017-05-31 NOTE — Interval H&P Note (Signed)
History and Physical Interval Note:  05/31/2017 8:24 AM  Rebecca Obrien  has presented today for surgery, with the diagnosis of DIVERTICULITIS WITH ABSCESS  The various methods of treatment have been discussed with the patient and family. After consideration of risks, benefits and other options for treatment, the patient has consented to  Procedure(s): LAPAROSCOPIC SIGMOID COLECTOMY (N/A) CYSTOSCOPY WITH STENT PLACEMENT- LIGHTED STENTS (Bilateral) as a surgical intervention .  The patient's history has been reviewed, patient examined, no change in status, stable for surgery.  I have reviewed the patient's chart and labs.  Questions were answered to the patient's satisfaction.    Patient seen and examined in the preoperative holding area. I then asked by general surgery to place preoperative ureteral stents for the purpose of ureteral identification intraoperatively. Recipient benefits were discussed with the patient extensively including risk of bleeding, infection, damage to surrounding structures, ureteral injury, ureteral edema. All questions were answered. She was agreeable to the plan. RRR/ CTAB.  Hollice Espy

## 2017-05-31 NOTE — Anesthesia Preprocedure Evaluation (Signed)
Anesthesia Evaluation  Patient identified by MRN, date of birth, ID band Patient awake    Reviewed: Allergy & Precautions, NPO status , Patient's Chart, lab work & pertinent test results  History of Anesthesia Complications (+) DIFFICULT AIRWAY and history of anesthetic complications  Airway Mallampati: III     Mouth opening: Limited Mouth Opening  Dental   Pulmonary Current Smoker,           Cardiovascular hypertension, Pt. on medications (-) Past MI and (-) CHF (-) dysrhythmias (-) Valvular Problems/Murmurs     Neuro/Psych CVA (L leg tingling, one occurance, ? etiology), No Residual Symptoms    GI/Hepatic   Endo/Other  Hypothyroidism (s/p thyroidectomy)   Renal/GU      Musculoskeletal  (+) Arthritis , Rheumatoid disorders,    Abdominal   Peds  Hematology   Anesthesia Other Findings   Reproductive/Obstetrics                            Anesthesia Physical Anesthesia Plan  ASA: III  Anesthesia Plan: General   Post-op Pain Management:    Induction: Intravenous  PONV Risk Score and Plan: 2 and Ondansetron and Dexamethasone  Airway Management Planned: Oral ETT  Additional Equipment:   Intra-op Plan:   Post-operative Plan:   Informed Consent: I have reviewed the patients History and Physical, chart, labs and discussed the procedure including the risks, benefits and alternatives for the proposed anesthesia with the patient or authorized representative who has indicated his/her understanding and acceptance.     Plan Discussed with:   Anesthesia Plan Comments:         Anesthesia Quick Evaluation

## 2017-05-31 NOTE — OR Nursing (Signed)
Dr. Ronelle Nigh requested a liter of fluid to go in rapidly due to patients lack of intake yesterday.  Her K+ today is 3.2.  First liter initiated and running at 200cc/hr

## 2017-06-01 LAB — BASIC METABOLIC PANEL
ANION GAP: 9 (ref 5–15)
BUN: 17 mg/dL (ref 6–20)
CHLORIDE: 103 mmol/L (ref 101–111)
CO2: 27 mmol/L (ref 22–32)
Calcium: 8.2 mg/dL — ABNORMAL LOW (ref 8.9–10.3)
Creatinine, Ser: 1.01 mg/dL — ABNORMAL HIGH (ref 0.44–1.00)
GFR calc Af Amer: >60 mL/min (ref >60)
GFR calc non Af Amer: >60 mL/min (ref >60)
Glucose, Bld: 117 mg/dL — ABNORMAL HIGH (ref 65–99)
POTASSIUM: 4 mmol/L (ref 3.5–5.1)
SODIUM: 139 mmol/L (ref 135–145)

## 2017-06-01 LAB — CBC
HEMATOCRIT: 33.5 % — AB (ref 35.0–47.0)
HEMOGLOBIN: 11.7 g/dL — AB (ref 12.0–16.0)
MCH: 28.6 pg (ref 26.0–34.0)
MCHC: 35 g/dL (ref 32.0–36.0)
MCV: 81.7 fL (ref 80.0–100.0)
Platelets: 269 10*3/uL (ref 150–440)
RBC: 4.1 MIL/uL (ref 3.80–5.20)
RDW: 13.9 % (ref 11.5–14.5)
WBC: 10.8 10*3/uL (ref 3.6–11.0)

## 2017-06-01 LAB — SURGICAL PATHOLOGY

## 2017-06-01 NOTE — Progress Notes (Signed)
1 Day Post-Op   Subjective:  Patient reports that she had worsening pain overnight that was controlled biopsy medications. She's not passing anything per rectum yet. She is been tolerating liquids without nausea or vomiting. Overall states she is just sore to her abdomen.  Vital signs in last 24 hours: Temp:  [97.5 F (36.4 C)-98.3 F (36.8 C)] 98.3 F (36.8 C) (09/26 0501) Pulse Rate:  [61-94] 76 (09/26 0501) Resp:  [11-20] 20 (09/26 0501) BP: (123-155)/(75-93) 147/85 (09/26 0501) SpO2:  [93 %-100 %] 96 % (09/26 0501)    Intake/Output from previous day: 09/25 0701 - 09/26 0700 In: 4458.3 [I.V.:4258.3; IV Piggyback:200] Out: 1310 [Urine:1110; Blood:200]  GI: Abdomen soft, appropriately tender to palpation in her incision sites, nondistended. Dressings in place to her operative sites without evidence of infection or drainage.  Lab Results:  CBC  Recent Labs  05/31/17 0848 06/01/17 0517  WBC  --  10.8  HGB 12.6 11.7*  HCT 37.0 33.5*  PLT  --  269   CMP     Component Value Date/Time   NA 139 06/01/2017 0517   NA 141 07/23/2012 1146   K 4.0 06/01/2017 0517   K 3.3 (L) 07/23/2012 1146   CL 103 06/01/2017 0517   CL 106 07/23/2012 1146   CO2 27 06/01/2017 0517   CO2 28 07/23/2012 1146   GLUCOSE 117 (H) 06/01/2017 0517   GLUCOSE 93 07/23/2012 1146   BUN 17 06/01/2017 0517   BUN 13 07/23/2012 1146   CREATININE 1.01 (H) 06/01/2017 0517   CREATININE 0.60 07/23/2012 1146   CALCIUM 8.2 (L) 06/01/2017 0517   CALCIUM 9.2 07/23/2012 1146   PROT 7.6 05/03/2017 1538   PROT 7.8 07/23/2012 1146   ALBUMIN 3.5 05/03/2017 1538   ALBUMIN 4.1 07/23/2012 1146   AST 25 05/03/2017 1538   AST 26 07/23/2012 1146   ALT 18 05/03/2017 1538   ALT 44 07/23/2012 1146   ALKPHOS 77 05/03/2017 1538   ALKPHOS 98 07/23/2012 1146   BILITOT 0.6 05/03/2017 1538   BILITOT 0.6 07/23/2012 1146   GFRNONAA >60 06/01/2017 0517   GFRNONAA >60 07/23/2012 1146   GFRAA >60 06/01/2017 0517   GFRAA >60  07/23/2012 1146   PT/INR No results for input(s): LABPROT, INR in the last 72 hours.  Studies/Results: Dg C-arm 1-60 Min-no Report  Result Date: 05/31/2017 Fluoroscopy was utilized by the requesting physician.  No radiographic interpretation.    Assessment/Plan: 51 year old female postop day #1 status post laparoscopic sigmoid colectomy. Doing well. Last of her ureteral stents were removed at the bedside this morning by me. Discussed we will continue her Foley catheter for an additional day. Encourage ambulation and incentive spirometer usage. Await return of bowel function.   Clayburn Pert, MD [facility]  Day ASCOM [phone] Night ASCOM [phone]  06/01/2017

## 2017-06-02 MED ORDER — LEFLUNOMIDE 20 MG PO TABS
20.0000 mg | ORAL_TABLET | Freq: Every day | ORAL | Status: DC
  Administered 2017-06-02 – 2017-06-03 (×2): 20 mg via ORAL
  Filled 2017-06-02 (×2): qty 1

## 2017-06-02 MED ORDER — HYDROCHLOROTHIAZIDE 12.5 MG PO CAPS
12.5000 mg | ORAL_CAPSULE | Freq: Every day | ORAL | Status: DC
  Administered 2017-06-02 – 2017-06-03 (×2): 12.5 mg via ORAL
  Filled 2017-06-02 (×2): qty 1

## 2017-06-02 MED ORDER — LEVOTHYROXINE SODIUM 112 MCG PO TABS
112.0000 ug | ORAL_TABLET | Freq: Every day | ORAL | Status: DC
  Administered 2017-06-02 – 2017-06-03 (×2): 112 ug via ORAL
  Filled 2017-06-02 (×2): qty 1

## 2017-06-02 MED ORDER — MELOXICAM 7.5 MG PO TABS
15.0000 mg | ORAL_TABLET | Freq: Every day | ORAL | Status: DC
  Administered 2017-06-02 – 2017-06-03 (×2): 15 mg via ORAL
  Filled 2017-06-02 (×2): qty 2

## 2017-06-02 MED ORDER — TOFACITINIB CITRATE ER 11 MG PO TB24: 11.0000 mg | ORAL_TABLET | Freq: Every day | ORAL | Status: DC

## 2017-06-02 NOTE — Care Management (Signed)
Patient admitted s/p colectomy.  Previous admission patient was discharged with PICC line and IV antibiotics through [facility].  Patient is  Independent.  Per MD patient to have PICC line pulled prior to discharge and will not require IV antibiotics.

## 2017-06-02 NOTE — Progress Notes (Signed)
2 Days Post-Op   Subjective:  Patient reports she had a very good night. Her pain is currently well controlled. She passed flatus overnight. She denies any nausea or vomiting and is tolerating her liquids.  Vital signs in last 24 hours: Temp:  [97.4 F (36.3 C)-98.2 F (36.8 C)] 97.4 F (36.3 C) (09/27 0501) Pulse Rate:  [65-75] 65 (09/27 0501) Resp:  [19-21] 21 (09/27 0501) BP: (119-143)/(70-75) 119/74 (09/27 0501) SpO2:  [95 %-97 %] 95 % (09/27 0501) Last BM Date: 05/30/17  Intake/Output from previous day: 09/26 0701 - 09/27 0700 In: 2576.6 [I.V.:2376.6; IV Piggyback:200] Out: 1950 [Urine:1950]  GI: Abdomen is soft, appropriately tender to palpation at the incision sites, nondistended. Dressings in place that are clean, dry, intact.  Lab Results:  CBC  Recent Labs  05/31/17 0848 06/01/17 0517  WBC  --  10.8  HGB 12.6 11.7*  HCT 37.0 33.5*  PLT  --  269   CMP     Component Value Date/Time   NA 139 06/01/2017 0517   NA 141 07/23/2012 1146   K 4.0 06/01/2017 0517   K 3.3 (L) 07/23/2012 1146   CL 103 06/01/2017 0517   CL 106 07/23/2012 1146   CO2 27 06/01/2017 0517   CO2 28 07/23/2012 1146   GLUCOSE 117 (H) 06/01/2017 0517   GLUCOSE 93 07/23/2012 1146   BUN 17 06/01/2017 0517   BUN 13 07/23/2012 1146   CREATININE 1.01 (H) 06/01/2017 0517   CREATININE 0.60 07/23/2012 1146   CALCIUM 8.2 (L) 06/01/2017 0517   CALCIUM 9.2 07/23/2012 1146   PROT 7.6 05/03/2017 1538   PROT 7.8 07/23/2012 1146   ALBUMIN 3.5 05/03/2017 1538   ALBUMIN 4.1 07/23/2012 1146   AST 25 05/03/2017 1538   AST 26 07/23/2012 1146   ALT 18 05/03/2017 1538   ALT 44 07/23/2012 1146   ALKPHOS 77 05/03/2017 1538   ALKPHOS 98 07/23/2012 1146   BILITOT 0.6 05/03/2017 1538   BILITOT 0.6 07/23/2012 1146   GFRNONAA >60 06/01/2017 0517   GFRNONAA >60 07/23/2012 1146   GFRAA >60 06/01/2017 0517   GFRAA >60 07/23/2012 1146   PT/INR No results for input(s): LABPROT, INR in the last 72  hours.  Studies/Results: No results found.  Assessment/Plan: 51 year old female status post laparoscopic sigmoid colectomy. Doing very well. Plan to advance her diet today. Encourage ambulation and incentive spirometer. Restart home medications today. Likely able to be discharged home in the next 24-48 hours.   Clayburn Pert, MD [facility]  Day ASCOM [phone] Night ASCOM [phone]  06/02/2017

## 2017-06-03 MED ORDER — OXYCODONE-ACETAMINOPHEN 5-325 MG PO TABS: 1.0000 | ORAL_TABLET | ORAL | 0 refills | Status: DC | PRN

## 2017-06-03 NOTE — Discharge Instructions (Signed)
Laparoscopic Colectomy, Care After °This sheet gives you information about how to care for yourself after your procedure. Your health care provider may also give you more specific instructions. If you have problems or questions, contact your health care provider. °What can I expect after the procedure? °After your procedure, it is common to have the following: °· Pain in your abdomen, especially in the incision areas. You will be given medicine to control the pain. °· Tiredness. This is a normal part of the recovery process. Your energy level will return to normal over the next several weeks. °· Changes in your bowel movements, such as constipation or needing to go more often. Talk with your health care provider about how to manage this. ° °Follow these instructions at home: °Medicines °· Take over-the-counter and prescription medicines only as told by your health care provider. °· Do not drive or use heavy machinery while taking prescription pain medicine. °· Do not drink alcohol while taking prescription pain medicine. °· If you were prescribed an antibiotic medicine, use it as told by your health care provider. Do not stop using the antibiotic even if you start to feel better. °Incision care °· Follow instructions from your health care provider about how to take care of your incision areas. Make sure you: °? Keep your incisions clean and dry. °? Wash your hands with soap and water before and after applying medicine to the areas, and before and after changing your bandage (dressing). If soap and water are not available, use hand sanitizer. °? Change your dressing as told by your health care provider. °? Leave stitches (sutures), skin glue, or adhesive strips in place. These skin closures may need to stay in place for 2 weeks or longer. If adhesive strip edges start to loosen and curl up, you may trim the loose edges. Do not remove adhesive strips completely unless your health care provider tells you to do  that. °· Do not wear tight clothing over the incisions. Tight clothing may rub and irritate the incision areas, which may cause the incisions to open. °· Do not take baths, swim, or use a hot tub until your health care provider approves. Ask your health care provider if you can take showers. You may only be allowed to take sponge baths for bathing. °· Check your incision area every day for signs of infection. Check for: °? More redness, swelling, or pain. °? More fluid or blood. °? Warmth. °? Pus or a bad smell. °Activity °· Avoid lifting anything that is heavier than 10 lb (4.5 kg) for 2 weeks or until your health care provider says it is okay. °· You may resume normal activities as told by your health care provider. Ask your health care provider what activities are safe for you. °· Take rest breaks during the day as needed. °Eating and drinking °· Follow instructions from your health care provider about what you can eat after surgery. °· To prevent or treat constipation while you are taking prescription pain medicine, your health care provider may recommend that you: °? Drink enough fluid to keep your urine clear or pale yellow. °? Take over-the-counter or prescription medicines. °? Eat foods that are high in fiber, such as fresh fruits and vegetables, whole grains, and beans. °? Limit foods that are high in fat and processed sugars, such as fried and sweet foods. °General instructions °· Ask your health care provider when you will need an appointment to get your sutures or staples removed. °· Keep   all follow-up visits as told by your health care provider. This is important. °Contact a health care provider if: °· You have more redness, swelling, or pain around your incisions. °· You have more fluid or blood coming from the incisions. °· Your incisions feel warm to the touch. °· You have pus or a bad smell coming from your incisions or your dressing. °· You have a fever. °· You have an incision that breaks open  (edges not staying together) after sutures or staples have been removed. °Get help right away if: °· You develop a rash. °· You have chest pain or difficulty breathing. °· You have pain or swelling in your legs. °· You feel light-headed or you faint. °· Your abdomen swells (becomes distended). °· You have nausea or vomiting. °· You have blood in your stool (feces). °This information is not intended to replace advice given to you by your health care provider. Make sure you discuss any questions you have with your health care provider. °Document Released: 03/12/2005 Document Revised: 05/24/2016 Document Reviewed: 05/24/2016 °Elsevier Interactive Patient Education © 2018 [employer] ° °

## 2017-06-03 NOTE — Care Management (Signed)
[facility] notified that patient is discharging today and would no longer require IV antibiotics.

## 2017-06-03 NOTE — Discharge Summary (Signed)
Patient ID: Rebecca Obrien MRN: [id number] DOB/AGE: November 06, 1965 51 y.o.  Admit date: 05/31/2017 Discharge date: 06/03/2017  Discharge Diagnoses:  Diverticulitis  Procedures Performed: Laparoscopic Sigmoid colectomy  Discharged Condition: good  Hospital Course: Patient brought into hospital for scheduled laparoscopic sigmoid colectomy secondary to persistent diverticulitis. She tolerated the procedure well. On the day of discharge she was on a regular diet and tolerating pills for pain control. Her abdomen is completely benign.  Discharge Orders: Discharge Instructions    Call MD for:  difficulty breathing, headache or visual disturbances    Complete by:  As directed    Call MD for:  persistant nausea and vomiting    Complete by:  As directed    Call MD for:  redness, tenderness, or signs of infection (pain, swelling, redness, odor or green/yellow discharge around incision site)    Complete by:  As directed    Call MD for:  severe uncontrolled pain    Complete by:  As directed    Call MD for:  temperature >100.4    Complete by:  As directed    Diet - low sodium heart healthy    Complete by:  As directed    Increase activity slowly    Complete by:  As directed       Disposition: 06-Home-Health Care Svc  Discharge Medications: Allergies as of 06/03/2017      Reactions   Adhesive [tape]    Skin breakdown/dermatitis with prolonged use    Latex Dermatitis   NOT LATEX ALLERGIC.  Patient has problem with adhesive, not latex      Medication List    STOP taking these medications   meropenem IVPB Commonly known as:  MERREM     TAKE these medications   acetaminophen 650 MG CR tablet Commonly known as:  TYLENOL Take 1,300 mg by mouth every 8 (eight) hours as needed for pain.   hydrochlorothiazide 12.5 MG capsule Commonly known as:  MICROZIDE Take 12.5 mg by mouth daily.   leflunomide 20 MG tablet Commonly known as:  ARAVA Take 20 mg by mouth daily.   levothyroxine  112 MCG tablet Commonly known as:  SYNTHROID, LEVOTHROID Take 112 mcg by mouth daily before breakfast.   meloxicam 15 MG tablet Commonly known as:  MOBIC Take 15 mg by mouth daily.   oxyCODONE-acetaminophen 5-325 MG tablet Commonly known as:  PERCOCET/ROXICET Take 1-2 tablets by mouth every 4 (four) hours as needed for moderate pain. What changed:  how much to take  reasons to take this  additional instructions   potassium chloride 10 MEQ tablet Commonly known as:  K-DUR Take 2 tablets by mouth 2 (two) times daily with a meal.   predniSONE 5 MG tablet Commonly known as:  DELTASONE Take 1-5 tablets by mouth See admin instructions. As needed for gout flare takes 5 tablets day one, 4 tablets day two, 3 tablets day three, 2 tablets day four, and 1 tablet day five.   Tofacitinib Citrate 11 MG Tb24 Take 11 mg by mouth daily.            Discharge Care Instructions        Start     Ordered   06/03/17 0000  oxyCODONE-acetaminophen (PERCOCET/ROXICET) 5-325 MG tablet  Every 4 hours PRN     06/03/17 0950   06/03/17 0000  Increase activity slowly     06/03/17 0950   06/03/17 0000  Diet - low sodium heart healthy     06/03/17 0950  06/03/17 0000  Call MD for:  temperature >100.4     06/03/17 0950   06/03/17 0000  Call MD for:  persistant nausea and vomiting     06/03/17 0950   06/03/17 0000  Call MD for:  severe uncontrolled pain     06/03/17 0950   06/03/17 0000  Call MD for:  redness, tenderness, or signs of infection (pain, swelling, redness, odor or green/yellow discharge around incision site)     06/03/17 0950   06/03/17 0000  Call MD for:  difficulty breathing, headache or visual disturbances     06/03/17 0950       Follwup: Follow-up Information    Clayburn Pert, MD. Go in 1 week(s).   Specialty:  General Surgery Why:  postop Contact information: [place] [address] [phone]           Signed: Clayburn Pert 06/03/2017, 9:51 AM

## 2017-06-03 NOTE — Progress Notes (Signed)
Brinlyn E Dura to be D/C'd Home per MD order.  Discussed prescriptions and follow up appointments with the patient. Prescriptions given to patient, medication list explained in detail. Pt verbalized understanding.  Allergies as of 06/03/2017      Reactions   Adhesive [tape]    Skin breakdown/dermatitis with prolonged use    Latex Dermatitis   NOT LATEX ALLERGIC.  Patient has problem with adhesive, not latex      Medication List    STOP taking these medications   meropenem IVPB Commonly known as:  MERREM     TAKE these medications   acetaminophen 650 MG CR tablet Commonly known as:  TYLENOL Take 1,300 mg by mouth every 8 (eight) hours as needed for pain.   hydrochlorothiazide 12.5 MG capsule Commonly known as:  MICROZIDE Take 12.5 mg by mouth daily.   leflunomide 20 MG tablet Commonly known as:  ARAVA Take 20 mg by mouth daily.   levothyroxine 112 MCG tablet Commonly known as:  SYNTHROID, LEVOTHROID Take 112 mcg by mouth daily before breakfast.   meloxicam 15 MG tablet Commonly known as:  MOBIC Take 15 mg by mouth daily.   oxyCODONE-acetaminophen 5-325 MG tablet Commonly known as:  PERCOCET/ROXICET Take 1-2 tablets by mouth every 4 (four) hours as needed for moderate pain. What changed:  how much to take  reasons to take this  additional instructions   potassium chloride 10 MEQ tablet Commonly known as:  K-DUR Take 2 tablets by mouth 2 (two) times daily with a meal.   predniSONE 5 MG tablet Commonly known as:  DELTASONE Take 1-5 tablets by mouth See admin instructions. As needed for gout flare takes 5 tablets day one, 4 tablets day two, 3 tablets day three, 2 tablets day four, and 1 tablet day five.   Tofacitinib Citrate 11 MG Tb24 Take 11 mg by mouth daily.            Discharge Care Instructions        Start     Ordered   06/03/17 0000  oxyCODONE-acetaminophen (PERCOCET/ROXICET) 5-325 MG tablet  Every 4 hours PRN     06/03/17 0950   06/03/17  0000  Increase activity slowly     06/03/17 0950   06/03/17 0000  Diet - low sodium heart healthy     06/03/17 0950   06/03/17 0000  Call MD for:  temperature >100.4     06/03/17 0950   06/03/17 0000  Call MD for:  persistant nausea and vomiting     06/03/17 0950   06/03/17 0000  Call MD for:  severe uncontrolled pain     06/03/17 0950   06/03/17 0000  Call MD for:  redness, tenderness, or signs of infection (pain, swelling, redness, odor or green/yellow discharge around incision site)     06/03/17 0950   06/03/17 0000  Call MD for:  difficulty breathing, headache or visual disturbances     06/03/17 0950      Vitals:   06/02/17 1803 06/02/17 2100  BP: (!) 168/95 (!) 155/84  Pulse: 86 78  Resp:  20  Temp: 98.1 F (36.7 C) 97.9 F (36.6 C)  SpO2: 97% 98%    Skin clean, dry and intact without evidence of skin break down, no evidence of skin tears noted. IV catheter discontinued intact. Site without signs and symptoms of complications. Dressing and pressure applied. Pt denies pain at this time. No complaints noted.  An After Visit Summary was printed and  given to the patient. Patient escorted via [place], and D/C home via private auto.  Sharalyn Ink

## 2017-06-06 ENCOUNTER — Telehealth: Payer: Self-pay

## 2017-06-06 NOTE — Telephone Encounter (Signed)
Post-op call made to patient at this time. Left a message for patient to call us back with any questions or concerns that she may have that we can address for her at this time. Reminded her of follow up appointment on 10/5 at 11 AM with Dr. Adonis Huguenin.

## 2017-06-07 ENCOUNTER — Other Ambulatory Visit: Payer: Self-pay | Admitting: *Deleted

## 2017-06-07 NOTE — Patient Outreach (Signed)
[facility]) Care Management  06/07/2017  Rebecca Obrien 1965/10/22 [id number]  Subjective:Telephone call to patient's home  / mobile number, no answer, left HIPAA compliant voicemail message, and requested call back.    Objective: Per chart review, patient hospitalized 05/2517 -05/2817 for persistent diverticulitis.  Status post  Laparoscopic sigmoid colectomy with immediate anastomosis, mobilization of splenic flexure on 05/31/17.  Patient hospitalized 05/03/17 - 05/07/17 for Diverticulitis of intestine with abscess.   Patient hospitalized 04/27/17 -04/30/17 for Diverticulitis with microperforation. Patient also has a history of Rheumatoid arthritis, hypertension, hyperlipidemia, skin melanomaand stroke.    Assessment:Received [facility] Transition of care referral on 05/27/17. Transition of care follow up pending patient contact.    Plan: RNCM will call patient for 2nd telephone outreach attempt, transition of care follow up, within 10 business days if no return call.      Rebecca Obrien H. [name], BSN, [place] [facility] Telephonic CM Phone: [phone] Fax: [phone]

## 2017-06-08 ENCOUNTER — Emergency Department: Payer: 59 | Admitting: Anesthesiology

## 2017-06-08 ENCOUNTER — Encounter
Admission: EM | Disposition: A | Discharge status: Home Health | Payer: Self-pay | Source: Home / Self Care | Attending: Surgery

## 2017-06-08 ENCOUNTER — Inpatient Hospital Stay
Admission: EM | Admit: 2017-06-08 | Discharge: 2017-06-20 | DRG: 329 | Disposition: A | Discharge status: Home Health | Payer: 59 | Attending: Surgery | Admitting: Surgery

## 2017-06-08 ENCOUNTER — Other Ambulatory Visit: Payer: Self-pay | Admitting: *Deleted

## 2017-06-08 ENCOUNTER — Emergency Department: Payer: 59

## 2017-06-08 ENCOUNTER — Encounter: Payer: Self-pay | Admitting: Emergency Medicine

## 2017-06-08 DIAGNOSIS — M069 Rheumatoid arthritis, unspecified: Secondary | ICD-10-CM | POA: Diagnosis present

## 2017-06-08 DIAGNOSIS — K668 Other specified disorders of peritoneum: Secondary | ICD-10-CM | POA: Diagnosis not present

## 2017-06-08 DIAGNOSIS — E890 Postprocedural hypothyroidism: Secondary | ICD-10-CM | POA: Diagnosis not present

## 2017-06-08 DIAGNOSIS — Z85828 Personal history of other malignant neoplasm of skin: Secondary | ICD-10-CM

## 2017-06-08 DIAGNOSIS — T8132XA Disruption of internal operation (surgical) wound, not elsewhere classified, initial encounter: Secondary | ICD-10-CM | POA: Diagnosis not present

## 2017-06-08 DIAGNOSIS — F17210 Nicotine dependence, cigarettes, uncomplicated: Secondary | ICD-10-CM | POA: Diagnosis not present

## 2017-06-08 DIAGNOSIS — Z9221 Personal history of antineoplastic chemotherapy: Secondary | ICD-10-CM

## 2017-06-08 DIAGNOSIS — K651 Peritoneal abscess: Secondary | ICD-10-CM | POA: Diagnosis present

## 2017-06-08 DIAGNOSIS — Z8673 Personal history of transient ischemic attack (TIA), and cerebral infarction without residual deficits: Secondary | ICD-10-CM

## 2017-06-08 DIAGNOSIS — Z79899 Other long term (current) drug therapy: Secondary | ICD-10-CM

## 2017-06-08 DIAGNOSIS — E785 Hyperlipidemia, unspecified: Secondary | ICD-10-CM | POA: Diagnosis not present

## 2017-06-08 DIAGNOSIS — R1084 Generalized abdominal pain: Secondary | ICD-10-CM | POA: Diagnosis not present

## 2017-06-08 DIAGNOSIS — Z9049 Acquired absence of other specified parts of digestive tract: Secondary | ICD-10-CM

## 2017-06-08 DIAGNOSIS — Z91048 Other nonmedicinal substance allergy status: Secondary | ICD-10-CM

## 2017-06-08 DIAGNOSIS — I10 Essential (primary) hypertension: Secondary | ICD-10-CM | POA: Diagnosis present

## 2017-06-08 DIAGNOSIS — Z90710 Acquired absence of both cervix and uterus: Secondary | ICD-10-CM

## 2017-06-08 DIAGNOSIS — K631 Perforation of intestine (nontraumatic): Secondary | ICD-10-CM | POA: Diagnosis not present

## 2017-06-08 DIAGNOSIS — E669 Obesity, unspecified: Secondary | ICD-10-CM | POA: Diagnosis not present

## 2017-06-08 DIAGNOSIS — Y832 Surgical operation with anastomosis, bypass or graft as the cause of abnormal reaction of the patient, or of later complication, without mention of misadventure at the time of the procedure: Secondary | ICD-10-CM | POA: Diagnosis present

## 2017-06-08 DIAGNOSIS — Z791 Long term (current) use of non-steroidal anti-inflammatories (NSAID): Secondary | ICD-10-CM

## 2017-06-08 DIAGNOSIS — R1030 Lower abdominal pain, unspecified: Secondary | ICD-10-CM | POA: Diagnosis not present

## 2017-06-08 DIAGNOSIS — Z6824 Body mass index (BMI) 24.0-24.9, adult: Secondary | ICD-10-CM

## 2017-06-08 DIAGNOSIS — Z960 Presence of urogenital implants: Secondary | ICD-10-CM | POA: Diagnosis present

## 2017-06-08 DIAGNOSIS — K9189 Other postprocedural complications and disorders of digestive system: Principal | ICD-10-CM | POA: Diagnosis present

## 2017-06-08 DIAGNOSIS — R109 Unspecified abdominal pain: Secondary | ICD-10-CM | POA: Diagnosis not present

## 2017-06-08 DIAGNOSIS — E039 Hypothyroidism, unspecified: Secondary | ICD-10-CM | POA: Diagnosis not present

## 2017-06-08 HISTORY — PX: LAPAROTOMY: SHX154

## 2017-06-08 HISTORY — PX: ILEOSTOMY: SHX1783

## 2017-06-08 LAB — URINALYSIS, COMPLETE (UACMP) WITH MICROSCOPIC
Bacteria, UA: NONE SEEN
Bilirubin Urine: NEGATIVE
GLUCOSE, UA: NEGATIVE mg/dL
Ketones, ur: 5 mg/dL — AB
Nitrite: NEGATIVE
PH: 5 (ref 5.0–8.0)
Protein, ur: NEGATIVE mg/dL

## 2017-06-08 LAB — CBC WITH DIFFERENTIAL/PLATELET
Basophils Absolute: 0 10*3/uL (ref 0–0.1)
Basophils Relative: 0 %
EOS PCT: 1 %
Eosinophils Absolute: 0.1 10*3/uL (ref 0–0.7)
HCT: 32.3 % — ABNORMAL LOW (ref 35.0–47.0)
Hemoglobin: 10.8 g/dL — ABNORMAL LOW (ref 12.0–16.0)
LYMPHS ABS: 2.5 10*3/uL (ref 1.0–3.6)
LYMPHS PCT: 19 %
MCH: 27.1 pg (ref 26.0–34.0)
MCHC: 33.4 g/dL (ref 32.0–36.0)
MCV: 81.2 fL (ref 80.0–100.0)
MONO ABS: 1 10*3/uL — AB (ref 0.2–0.9)
Monocytes Relative: 7 %
Neutro Abs: 9.9 10*3/uL — ABNORMAL HIGH (ref 1.4–6.5)
Neutrophils Relative %: 73 %
PLATELETS: 388 10*3/uL (ref 150–440)
RBC: 3.98 MIL/uL (ref 3.80–5.20)
RDW: 13.9 % (ref 11.5–14.5)
WBC: 13.5 10*3/uL — ABNORMAL HIGH (ref 3.6–11.0)

## 2017-06-08 LAB — COMPREHENSIVE METABOLIC PANEL
ALT: 16 U/L (ref 14–54)
AST: 18 U/L (ref 15–41)
Albumin: 3.1 g/dL — ABNORMAL LOW (ref 3.5–5.0)
Alkaline Phosphatase: 84 U/L (ref 38–126)
Anion gap: 13 (ref 5–15)
BUN: 12 mg/dL (ref 6–20)
CALCIUM: 8.6 mg/dL — AB (ref 8.9–10.3)
CHLORIDE: 99 mmol/L — AB (ref 101–111)
CO2: 26 mmol/L (ref 22–32)
Creatinine, Ser: 0.78 mg/dL (ref 0.44–1.00)
Glucose, Bld: 113 mg/dL — ABNORMAL HIGH (ref 65–99)
Potassium: 3.4 mmol/L — ABNORMAL LOW (ref 3.5–5.1)
Sodium: 138 mmol/L (ref 135–145)
TOTAL PROTEIN: 7.2 g/dL (ref 6.5–8.1)
Total Bilirubin: 1.1 mg/dL (ref 0.3–1.2)

## 2017-06-08 LAB — LIPASE, BLOOD: LIPASE: 15 U/L (ref 11–51)

## 2017-06-08 SURGERY — LAPAROTOMY, EXPLORATORY
Anesthesia: General | Site: Abdomen | Wound class: Dirty or Infected

## 2017-06-08 MED ORDER — HYDROMORPHONE HCL 1 MG/ML IJ SOLN
INTRAMUSCULAR | Status: AC
  Filled 2017-06-08: qty 1

## 2017-06-08 MED ORDER — LACTATED RINGERS IV SOLN
INTRAVENOUS | Status: DC | PRN
  Administered 2017-06-08 (×2): via INTRAVENOUS

## 2017-06-08 MED ORDER — LACTATED RINGERS IV SOLN: INTRAVENOUS | Status: DC

## 2017-06-08 MED ORDER — ROCURONIUM BROMIDE 50 MG/5ML IV SOLN
INTRAVENOUS | Status: AC
  Filled 2017-06-08: qty 1

## 2017-06-08 MED ORDER — ONDANSETRON HCL 4 MG/2ML IJ SOLN
INTRAMUSCULAR | Status: AC
  Filled 2017-06-08: qty 2

## 2017-06-08 MED ORDER — PIPERACILLIN-TAZOBACTAM 3.375 G IVPB 30 MIN
3.3750 g | Freq: Once | INTRAVENOUS | Status: AC
  Administered 2017-06-08: 3.375 g via INTRAVENOUS

## 2017-06-08 MED ORDER — MIDAZOLAM HCL 2 MG/2ML IJ SOLN
INTRAMUSCULAR | Status: DC | PRN
  Administered 2017-06-08: 1 mg via INTRAVENOUS

## 2017-06-08 MED ORDER — ONDANSETRON HCL 4 MG/2ML IJ SOLN
4.0000 mg | Freq: Once | INTRAMUSCULAR | Status: AC
  Administered 2017-06-08: 4 mg via INTRAVENOUS

## 2017-06-08 MED ORDER — SUGAMMADEX SODIUM 200 MG/2ML IV SOLN
INTRAVENOUS | Status: AC
  Filled 2017-06-08: qty 2

## 2017-06-08 MED ORDER — MIDAZOLAM HCL 2 MG/2ML IJ SOLN
INTRAMUSCULAR | Status: AC
  Filled 2017-06-08: qty 2

## 2017-06-08 MED ORDER — HEPARIN SODIUM (PORCINE) 5000 UNIT/ML IJ SOLN: 5000.0000 [IU] | Freq: Three times a day (TID) | INTRAMUSCULAR | Status: DC

## 2017-06-08 MED ORDER — SUCCINYLCHOLINE CHLORIDE 20 MG/ML IJ SOLN
INTRAMUSCULAR | Status: DC | PRN
  Administered 2017-06-08: 50 mg via INTRAVENOUS
  Administered 2017-06-08: 100 mg via INTRAVENOUS

## 2017-06-08 MED ORDER — PIPERACILLIN-TAZOBACTAM 3.375 G IVPB 30 MIN
INTRAVENOUS | Status: AC
  Administered 2017-06-08: 3.375 g via INTRAVENOUS
  Filled 2017-06-08: qty 50

## 2017-06-08 MED ORDER — DEXAMETHASONE SODIUM PHOSPHATE 10 MG/ML IJ SOLN
INTRAMUSCULAR | Status: AC
  Filled 2017-06-08: qty 1

## 2017-06-08 MED ORDER — HYDROMORPHONE HCL 1 MG/ML IJ SOLN
1.0000 mg | Freq: Once | INTRAMUSCULAR | Status: AC
  Administered 2017-06-08: 1 mg via INTRAVENOUS

## 2017-06-08 MED ORDER — ONDANSETRON HCL 4 MG/2ML IJ SOLN
INTRAMUSCULAR | Status: DC | PRN
  Administered 2017-06-08: 4 mg via INTRAVENOUS

## 2017-06-08 MED ORDER — SODIUM CHLORIDE 0.9 % IV BOLUS (SEPSIS)
1000.0000 mL | Freq: Once | INTRAVENOUS | Status: AC
  Administered 2017-06-08: 1000 mL via INTRAVENOUS

## 2017-06-08 MED ORDER — MORPHINE SULFATE (PF) 4 MG/ML IV SOLN
4.0000 mg | Freq: Once | INTRAVENOUS | Status: AC
  Administered 2017-06-08: 4 mg via INTRAVENOUS

## 2017-06-08 MED ORDER — BUPIVACAINE LIPOSOME 1.3 % IJ SUSP
INTRAMUSCULAR | Status: AC
  Filled 2017-06-08: qty 20

## 2017-06-08 MED ORDER — MORPHINE SULFATE (PF) 4 MG/ML IV SOLN
4.0000 mg | Freq: Once | INTRAVENOUS | Status: AC
  Administered 2017-06-08: 4 mg via INTRAVENOUS
  Filled 2017-06-08: qty 1

## 2017-06-08 MED ORDER — ONDANSETRON HCL 4 MG/2ML IJ SOLN
4.0000 mg | Freq: Once | INTRAMUSCULAR | Status: AC
  Administered 2017-06-08: 4 mg via INTRAVENOUS
  Filled 2017-06-08: qty 2

## 2017-06-08 MED ORDER — ROCURONIUM BROMIDE 100 MG/10ML IV SOLN
INTRAVENOUS | Status: DC | PRN
  Administered 2017-06-08 (×3): 20 mg via INTRAVENOUS

## 2017-06-08 MED ORDER — PROPOFOL 10 MG/ML IV BOLUS
INTRAVENOUS | Status: AC
  Filled 2017-06-08: qty 20

## 2017-06-08 MED ORDER — BUPIVACAINE HCL (PF) 0.5 % IJ SOLN
INTRAMUSCULAR | Status: DC | PRN
  Administered 2017-06-08: 50 mL

## 2017-06-08 MED ORDER — MORPHINE SULFATE (PF) 4 MG/ML IV SOLN
INTRAVENOUS | Status: AC
  Filled 2017-06-08: qty 1

## 2017-06-08 MED ORDER — BUPIVACAINE HCL (PF) 0.5 % IJ SOLN
INTRAMUSCULAR | Status: AC
  Filled 2017-06-08: qty 30

## 2017-06-08 MED ORDER — IOPAMIDOL (ISOVUE-300) INJECTION 61%
15.0000 mL | INTRAVENOUS | Status: AC
  Administered 2017-06-08: 15 mL via ORAL

## 2017-06-08 MED ORDER — SUGAMMADEX SODIUM 500 MG/5ML IV SOLN
INTRAVENOUS | Status: DC | PRN
  Administered 2017-06-08: 150 mg via INTRAVENOUS

## 2017-06-08 MED ORDER — HYDROMORPHONE HCL 1 MG/ML IJ SOLN
1.0000 mg | INTRAMUSCULAR | Status: DC | PRN
  Administered 2017-06-08 – 2017-06-09 (×4): 1 mg via INTRAVENOUS
  Filled 2017-06-08 (×4): qty 1

## 2017-06-08 MED ORDER — PROPOFOL 10 MG/ML IV BOLUS
INTRAVENOUS | Status: DC | PRN
  Administered 2017-06-08: 60 mg via INTRAVENOUS
  Administered 2017-06-08: 140 mg via INTRAVENOUS

## 2017-06-08 MED ORDER — FENTANYL CITRATE (PF) 100 MCG/2ML IJ SOLN
INTRAMUSCULAR | Status: DC | PRN
  Administered 2017-06-08: 100 ug via INTRAVENOUS
  Administered 2017-06-08 – 2017-06-09 (×3): 50 ug via INTRAVENOUS

## 2017-06-08 MED ORDER — LACTATED RINGERS IV SOLN
INTRAVENOUS | Status: DC | PRN
  Administered 2017-06-08: 21:00:00 via INTRAVENOUS

## 2017-06-08 MED ORDER — PHENYLEPHRINE HCL 10 MG/ML IJ SOLN
INTRAMUSCULAR | Status: DC | PRN
  Administered 2017-06-08: 100 ug via INTRAVENOUS

## 2017-06-08 MED ORDER — HYDROMORPHONE HCL 1 MG/ML IJ SOLN
INTRAMUSCULAR | Status: AC
  Administered 2017-06-08: 1 mg via INTRAVENOUS
  Filled 2017-06-08: qty 1

## 2017-06-08 MED ORDER — SUCCINYLCHOLINE CHLORIDE 20 MG/ML IJ SOLN
INTRAMUSCULAR | Status: AC
  Filled 2017-06-08: qty 1

## 2017-06-08 MED ORDER — PIPERACILLIN-TAZOBACTAM 3.375 G IVPB 30 MIN
3.3750 g | Freq: Once | INTRAVENOUS | Status: AC
  Administered 2017-06-08: 3.375 g via INTRAVENOUS
  Filled 2017-06-08: qty 50

## 2017-06-08 MED ORDER — DEXAMETHASONE SODIUM PHOSPHATE 10 MG/ML IJ SOLN
INTRAMUSCULAR | Status: DC | PRN
  Administered 2017-06-08: 10 mg via INTRAVENOUS

## 2017-06-08 MED ORDER — FENTANYL CITRATE (PF) 250 MCG/5ML IJ SOLN
INTRAMUSCULAR | Status: AC
  Filled 2017-06-08: qty 5

## 2017-06-08 MED ORDER — IOPAMIDOL (ISOVUE-300) INJECTION 61%: 100.0000 mL | Freq: Once | INTRAVENOUS | Status: DC | PRN

## 2017-06-08 SURGICAL SUPPLY — 51 items
APPLIER CLIP 11 MED OPEN (CLIP)
APPLIER CLIP 13 LRG OPEN (CLIP)
BAG BILE T-TUBES STRL (MISCELLANEOUS) IMPLANT
BARRIER SKIN 2 3/4 (OSTOMY) IMPLANT
BULB RESERV EVAC DRAIN JP 100C (MISCELLANEOUS) ×4 IMPLANT
CATH ROBINSON RED A/P 16FR (CATHETERS) ×2 IMPLANT
CLAMP POUCH DRAINAGE QUIET (OSTOMY) IMPLANT
CLIP APPLIE 11 MED OPEN (CLIP) IMPLANT
CLIP APPLIE 13 LRG OPEN (CLIP) IMPLANT
DRAIN CHANNEL JP 19F (MISCELLANEOUS) ×4 IMPLANT
DRSG OPSITE POSTOP 4X10 (GAUZE/BANDAGES/DRESSINGS) ×2 IMPLANT
DURAPREP 26ML APPLICATOR (WOUND CARE) IMPLANT
ELECT BLADE 6 FLAT ULTRCLN (ELECTRODE) ×2 IMPLANT
ELECT REM PT RETURN 9FT ADLT (ELECTROSURGICAL) ×2
ELECTRODE REM PT RTRN 9FT ADLT (ELECTROSURGICAL) ×1 IMPLANT
GAUZE SPONGE 4X4 12PLY STRL (GAUZE/BANDAGES/DRESSINGS) ×2 IMPLANT
GLOVE BIOGEL PI IND STRL 7.5 (GLOVE) ×1 IMPLANT
GLOVE BIOGEL PI INDICATOR 7.5 (GLOVE) ×1
GLOVE ECLIPSE 7.0 STRL STRAW (GLOVE) IMPLANT
GLOVE PROTEXIS LATEX SZ 7.5 (GLOVE) ×8 IMPLANT
GLOVE SKINSENSE NS SZ7.0 (GLOVE) ×1
GLOVE SKINSENSE STRL SZ7.0 (GLOVE) ×1 IMPLANT
GOWN STRL REUS W/TWL LRG LVL3 (GOWN DISPOSABLE) ×8 IMPLANT
HANDLE SUCTION POOLE (INSTRUMENTS) ×1 IMPLANT
KIT RM TURNOVER STRD PROC AR (KITS) ×2 IMPLANT
NEEDLE HYPO 22GX1.5 SAFETY (NEEDLE) ×2 IMPLANT
NS IRRIG 1000ML POUR BTL (IV SOLUTION) ×2 IMPLANT
PACK BASIN MAJOR ARMC (MISCELLANEOUS) ×2 IMPLANT
PACK COLON CLEAN CLOSURE (MISCELLANEOUS) ×2 IMPLANT
RELOAD LINEAR CUT PROX 55 BLUE (ENDOMECHANICALS) IMPLANT
RELOAD PROXIMATE 75MM BLUE (ENDOMECHANICALS) IMPLANT
REMOVER STAPLE SKIN (DISPOSABLE) ×2 IMPLANT
RETRACTOR WND ALEXIS-O 25 LRG (MISCELLANEOUS) IMPLANT
RETRACTOR WOUND ALXS 18CM MED (MISCELLANEOUS) ×1 IMPLANT
RTRCTR WOUND ALEXIS O 18CM MED (MISCELLANEOUS) ×2
RTRCTR WOUND ALEXIS O 25CM LRG (MISCELLANEOUS)
STAPLER GUN LINEAR PROX 60 (STAPLE) IMPLANT
STAPLER PROXIMATE 55 BLUE (STAPLE) IMPLANT
STAPLER PROXIMATE 75MM BLUE (STAPLE) IMPLANT
STAPLER SKIN PROX 35W (STAPLE) ×2 IMPLANT
SUCTION POOLE HANDLE (INSTRUMENTS) ×2
SUT CHROMIC 0 SH (SUTURE) IMPLANT
SUT CHROMIC 2 0 SH (SUTURE) IMPLANT
SUT PDS #1 CTX NDL (SUTURE) ×2 IMPLANT
SUT PDS AB 0 CT1 27 (SUTURE) ×4 IMPLANT
SUT SILK 2 0 (SUTURE) ×1
SUT SILK 2-0 18XBRD TIE 12 (SUTURE) ×1 IMPLANT
SUT SILK 3-0 (SUTURE) ×1
SUT SILK 3-0 SH-1 18XCR BRD (SUTURE) ×1
SUTURE SILK 3-0 SH-1 18XCR BRD (SUTURE) ×1 IMPLANT
TRAY FOLEY CATH SILVER 16FR LF (SET/KITS/TRAYS/PACK) ×2 IMPLANT

## 2017-06-08 NOTE — Anesthesia Preprocedure Evaluation (Signed)
Anesthesia Evaluation  Patient identified by MRN, date of birth, ID band Patient awake    Reviewed: Allergy & Precautions, H&P , NPO status , Patient's Chart, lab work & pertinent test results, reviewed documented beta blocker date and time   Airway Mallampati: IV  TM Distance: <3 FB Neck ROM: full  Mouth opening: Limited Mouth Opening  Dental  (+) Teeth Intact   Pulmonary neg pulmonary ROS, Current Smoker,    Pulmonary exam normal        Cardiovascular Exercise Tolerance: Poor hypertension, On Medications negative cardio ROS Normal cardiovascular exam Rhythm:regular Rate:Normal     Neuro/Psych CVA, No Residual Symptoms negative neurological ROS  negative psych ROS   GI/Hepatic negative GI ROS, Neg liver ROS,   Endo/Other  negative endocrine ROSHypothyroidism Hyperthyroidism   Renal/GU negative Renal ROS  negative genitourinary   Musculoskeletal   Abdominal   Peds  Hematology negative hematology ROS (+)   Anesthesia Other Findings Past Medical History: No date: Arthritis     Comment:  RA No date: Cancer ([place])     Comment:  skin melanoma No date: Collagen vascular disease (H[facility])     Comment:  RA No date: Difficult airway No date: Difficult intubation     Comment:  Very anterior airway. Small TMD, small mouth opening No date: Diverticulitis of sigmoid colon No date: History of kidney stones No date: Hyperlipidemia No date: Hypertension No date: Hyperthyroidism No date: Hypokalemia No date: Obesity No date: Rheumatoid arthritis(714.0) 2013: Stroke ([place])     Comment:  no residual Past Surgical History: No date: ABDOMINAL HYSTERECTOMY Bilateral: CARPAL TUNNEL RELEASE No date: CHOLECYSTECTOMY 05/31/2017: CYSTOSCOPY WITH STENT PLACEMENT; Bilateral     Comment:  Procedure: [place];  Surgeon: Hollice Espy, MD;  Location: [facility]               ORS;   Service: Urology;  Laterality: Bilateral; 05/31/2017: LAPAROSCOPIC SIGMOID COLECTOMY; N/A     Comment:  Procedure: LAPAROSCOPIC SIGMOID COLECTOMY;  Surgeon:               Clayburn Pert, MD;  Location: [facility] ORS;  Service:               General;  Laterality: N/A; 07/26/2012: TEE WITHOUT CARDIOVERSION     Comment:  Procedure: TRANSESOPHAGEAL ECHOCARDIOGRAM (TEE);                Surgeon: Thayer Headings, MD;  Location: [facility] ENDOSCOPY;                Service: Cardiovascular;  Laterality: N/A; No date: TONSILLECTOMY No date: TOTAL THYROIDECTOMY BMI    Body Mass Index:  27.46 kg/m     Reproductive/Obstetrics negative OB ROS                             Anesthesia Physical Anesthesia Plan  ASA: III and emergent  Anesthesia Plan: General ETT   Post-op Pain Management:    Induction:   PONV Risk Score and Plan: 4 or greater and Ondansetron, Dexamethasone, Midazolam and Propofol infusion  Airway Management Planned:   Additional Equipment:   Intra-op Plan:   Post-operative Plan:   Informed Consent: I have reviewed the patients History and Physical, chart, labs and discussed the procedure including the risks, benefits and alternatives for the proposed anesthesia with the patient or  authorized representative who has indicated his/her understanding and acceptance.   Dental Advisory Given  Plan Discussed with: CRNA  Anesthesia Plan Comments:         Anesthesia Quick Evaluation

## 2017-06-08 NOTE — H&P (Signed)
Rebecca Obrien is an 51 y.o. female.    Chief Complaint: Abdominal pain  HPI: This a patient with the acute onset of abdominal pain. She is 8 days status post sigmoid colectomy with EEA anastomosis performed by Dr. Adonis Huguenin for diverticulitis. She had also had a pelvic abscess. She experienced the acute onset of lower abdominal pain radiating up towards her bellybutton while having a bowel movement. She states she was not straining had normal soft stool with no blood or melena or dark color. Had some nausea but no emesis since then. She's never felt an episode like this before. She is asked experiencing tremendous pain and asking for more pain medication. She denies fevers or chills.  She has multiple medical problems including rheumatoid arthritis. See below.  Past Medical History:  Diagnosis Date  . Arthritis    RA  . Cancer ([place])    skin melanoma  . Collagen vascular disease (HCC)    RA  . Difficult airway   . 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 Diverticulitis of sigmoid colon   . History of kidney stones   . Hyperlipidemia   . Hypertension   . Hyperthyroidism   . Hypokalemia   . Obesity   . Rheumatoid arthritis(714.0)   . Stroke [facility]) 2013   no residual    Past Surgical History:  Procedure Laterality Date  . ABDOMINAL HYSTERECTOMY    . CARPAL TUNNEL RELEASE  Bilateral  . CHOLECYSTECTOMY    . CYSTOSCOPY WITH STENT PLACEMENT Bilateral 05/31/2017   Procedure: CYSTOSCOPY WITH STENT PLACEMENT- LIGHTED STENTS;  Surgeon: Hollice Espy, MD;  Location: [facility] ORS;  Service: Urology;  Laterality: Bilateral;  . LAPAROSCOPIC SIGMOID COLECTOMY N/A 05/31/2017   Procedure: LAPAROSCOPIC SIGMOID COLECTOMY;  Surgeon: Clayburn Pert, MD;  Location: [facility] ORS;  Service: General;  Laterality: N/A;  . TEE WITHOUT CARDIOVERSION  07/26/2012   Procedure: TRANSESOPHAGEAL ECHOCARDIOGRAM (TEE);  Surgeon: Thayer Headings, MD;  Location: [place];   Service: Cardiovascular;  Laterality: N/A;  . TONSILLECTOMY    . TOTAL THYROIDECTOMY      Family History  Problem Relation Age of Onset  . Nephrolithiasis Mother   . Hypertension Mother   . Hypertension Father   . Heart failure Father   . Cardiomyopathy Brother    Social History:  reports that she has been smoking Cigarettes.  She has a 5.75 pack-year smoking history. She has never used smokeless tobacco. She reports that she does not drink alcohol or use drugs.  Allergies:  Allergies  Allergen Reactions  . Adhesive [Tape]     Skin breakdown/dermatitis with prolonged use   . Latex Dermatitis    NOT LATEX ALLERGIC.  Patient has problem with adhesive, not latex     (Not in a hospital admission)   Review of Systems  Constitutional: Negative for chills and fever.  HENT: Negative.   Eyes: Negative.   Respiratory: Negative.   Cardiovascular: Negative.   Gastrointestinal: Positive for abdominal pain and nausea. Negative for blood in stool, constipation, diarrhea, heartburn and vomiting.  Genitourinary: Negative.   Musculoskeletal: Negative.   Skin: Negative.   Neurological: Negative.   Endo/Heme/Allergies: Negative.   Psychiatric/Behavioral: Negative.      Physical Exam:  BP 127/80   Pulse (!) 116   Temp 97.8 F (36.6 C) (Oral)   Resp (!) 26   Ht _0  (1.651 m)   Wt 165 lb (74.8 kg)   SpO2 98%  BMI 27.46 kg/m   Physical Exam  Constitutional: She is oriented to person, place, and time.  Very uncomfortable-appearing female patient she prefers to lyse perfectly flat does not like to sit up.  HENT:  Head: Normocephalic and atraumatic.  Eyes: Pupils are equal, round, and reactive to light. Right eye exhibits no discharge. Left eye exhibits no discharge. No scleral icterus.  Neck: Normal range of motion. No JVD present.  Cardiovascular: Normal rate, regular rhythm and normal heart sounds.   Pulmonary/Chest: Effort normal and breath sounds normal. No respiratory  distress. She has no wheezes. She has no rales.  Abdominal: She exhibits distension. There is tenderness. There is rebound and guarding.  Distended maximal tenderness in the infraumbilical area with percussion or rebound tenderness.  Multiple incisions are dressed without erythema or purulence  Musculoskeletal: Normal range of motion. She exhibits edema. She exhibits no tenderness.  Lymphadenopathy:    She has no cervical adenopathy.  Neurological: She is alert and oriented to person, place, and time.  Skin: Skin is warm and dry. No rash noted. No erythema.  Psychiatric: Mood and affect normal.  Vitals reviewed.       Results for orders placed or performed during the hospital encounter of 06/08/17 (from the past 48 hour(s))  CBC with Differential     Status: Abnormal   Collection Time: 06/08/17  1:44 PM  Result Value Ref Range   WBC 13.5 (H) 3.6 - 11.0 K/uL   RBC 3.98 3.80 - 5.20 MIL/uL   Hemoglobin 10.8 (L) 12.0 - 16.0 g/dL   HCT 32.3 (L) 35.0 - 47.0 %   MCV 81.2 80.0 - 100.0 fL   MCH 27.1 26.0 - 34.0 pg   MCHC 33.4 32.0 - 36.0 g/dL   RDW 13.9 11.5 - 14.5 %   Platelets 388 150 - 440 K/uL   Neutrophils Relative % 73 %   Neutro Abs 9.9 (H) 1.4 - 6.5 K/uL   Lymphocytes Relative 19 %   Lymphs Abs 2.5 1.0 - 3.6 K/uL   Monocytes Relative 7 %   Monocytes Absolute 1.0 (H) 0.2 - 0.9 K/uL   Eosinophils Relative 1 %   Eosinophils Absolute 0.1 0 - 0.7 K/uL   Basophils Relative 0 %   Basophils Absolute 0.0 0 - 0.1 K/uL  Comprehensive metabolic panel     Status: Abnormal   Collection Time: 06/08/17  1:44 PM  Result Value Ref Range   Sodium 138 135 - 145 mmol/L   Potassium 3.4 (L) 3.5 - 5.1 mmol/L   Chloride 99 (L) 101 - 111 mmol/L   CO2 26 22 - 32 mmol/L   Glucose, Bld 113 (H) 65 - 99 mg/dL   BUN 12 6 - 20 mg/dL   Creatinine, Ser 0.78 0.44 - 1.00 mg/dL   Calcium 8.6 (L) 8.9 - 10.3 mg/dL   Total Protein 7.2 6.5 - 8.1 g/dL   Albumin 3.1 (L) 3.5 - 5.0 g/dL   AST 18 15 - 41 U/L    ALT 16 14 - 54 U/L   Alkaline Phosphatase 84 38 - 126 U/L   Total Bilirubin 1.1 0.3 - 1.2 mg/dL   GFR calc non Af Amer >60 >60 mL/min   GFR calc Af Amer >60 >60 mL/min    Comment: (NOTE) The eGFR has been calculated using the CKD EPI equation. This calculation has not been validated in all clinical situations. eGFR's persistently <60 mL/min signify possible Chronic Kidney Disease.    Anion gap 13 5 -  15  Lipase, blood     Status: None   Collection Time: 06/08/17  1:44 PM  Result Value Ref Range   Lipase 15 11 - 51 U/L   Ct Abdomen Pelvis W Contrast  Result Date: 06/08/2017 CLINICAL DATA:  Acute middle abdominal pain. EXAM: CT ABDOMEN AND PELVIS WITH CONTRAST TECHNIQUE: Multidetector CT imaging of the abdomen and pelvis was performed using the standard protocol following bolus administration of intravenous contrast. CONTRAST:  100 mL of Isovue-300 intravenously. COMPARISON:  CT scan of May 16, 2017. FINDINGS: Lower chest: No acute abnormality. Hepatobiliary: Status post cholecystectomy. Stable left hepatic cyst. Pancreas: Unremarkable. No pancreatic ductal dilatation or surrounding inflammatory changes. Spleen: Normal in size without focal abnormality. Adrenals/Urinary Tract: Adrenal glands are unremarkable. Kidneys are normal, without renal calculi, focal lesion, or hydronephrosis. Bladder is unremarkable. Stomach/Bowel: The stomach appears normal. The appendix appears normal. Status post sigmoid colectomy. There is no evidence of bowel obstruction. There appears to be a large perforation at the anastomotic site between the residual colon and rectum best seen on image number 72 of series 2. There is stool and gas in this area, in pneumoperitoneum is noted throughout the abdomen. Mild wall thickening and inflammatory changes are seen involving small bowel loops in the pelvis most likely representing secondary inflammation. Vascular/Lymphatic: Aortic atherosclerosis is noted. Stable mildly  enlarged retroperitoneal lymph nodes are noted which most likely are reactive in etiology. Reproductive: Status post hysterectomy. No adnexal masses. Other: Pneumoperitoneum is seen throughout the abdomen consistent with bowel perforation as described above. No abnormal hernia is noted. Musculoskeletal: No acute or significant osseous findings. IMPRESSION: Patient is status post recent sigmoid colectomy for diverticulitis. However, there appears to be a large perforation at the anastomotic site between the residual sigmoid colon and rectum in the pelvis, resulting in pneumoperitoneum throughout the abdomen and stool in the pelvis. Mild wall thickening inflammatory changes are seen involving small bowel loops in the pelvis most likely representing secondary inflammation. Critical Value/emergent results were called by telephone at the time of interpretation on 06/08/2017 at 3:23 pm to Dr. Alfred Levins, who verbally acknowledged these results. Electronically Signed   By: Marijo Conception, M.D.   On: 06/08/2017 15:23     Assessment/Plan  CT scan personally O reviewed as are the labs. Patient appears stable at this time but is very uncomfortable. There are signs of disrupted anastomosis in the pelvis. I have reviewed the CT scan with Dr. Adonis Huguenin, her operating surgeon. We discussed the potential operations. I discussed with the family and the patient the need for urgent surgery this E this afternoon or evening. Also discussed with him the rationale for offering surgery as well as the risks of bleeding infection reinfection and abscess in the strong likelihood that she would require either an ileostomy or possibly even a colostomy. All questions were answered for them they understood and agreed to proceed  Florene Glen, MD, FACS

## 2017-06-08 NOTE — ED Notes (Signed)
Patient placed on 2L [place] for supplementation after administration of pain medication. Patient easy to arouse, but dosing on and off.

## 2017-06-08 NOTE — Patient Outreach (Signed)
[facility]) Care Management  06/08/2017  SHRESHTA MEDLEY [birth year] [id number]   Subjective:Telephone call to patient's home  / mobile number, no answer, left HIPAA compliant voicemail message, and requested call back.    Objective: Per chart review, patient hospitalized 05/2517 -05/2817 for persistent diverticulitis.  Status post  Laparoscopic sigmoid colectomy with immediate anastomosis, mobilization of splenic flexure on 05/31/17.  Patient hospitalized 05/03/17 - 05/07/17 for Diverticulitis of intestine with abscess. Patient hospitalized 04/27/17 -04/30/17 for Diverticulitis with microperforation. Patient also has a history of Rheumatoid arthritis, hypertension, hyperlipidemia, skin melanomaand stroke.    Assessment:Received [facility] Transition of care referral on 05/27/17. Transition of care follow up pending patient contact.    Plan: RNCM will call patient for 3rd telephone outreach attempt, transition of care follow up, within 10 business days if no return call.      Alysse Rathe H. [name], BSN, [place] [facility] Telephonic CM Phone: [phone] Fax: [phone]

## 2017-06-08 NOTE — ED Provider Notes (Signed)
-----------------------------------------   3:28 PM on 06/08/2017 -----------------------------------------   Blood pressure 123/80, pulse 86, temperature 97.8 F (36.6 C), temperature source Oral, resp. rate (!) 22, height 5\' 5"  (1.651 m), weight 74.8 kg (165 lb), SpO2 93 %.  Assuming care from Dr. Clearnce Hasten of MATHEW STORCK is a 51 y.o. female with a chief complaint of Abdominal Pain .    Please refer to H&P by previous MD for further details.  The current plan of care is to follow up results of CT a/p.  CT a/p:   IMPRESSION: Patient is status post recent sigmoid colectomy for diverticulitis. However, there appears to be a large perforation at the anastomotic site between the residual sigmoid colon and rectum in the pelvis, resulting in pneumoperitoneum throughout the abdomen and stool in the pelvis. Mild wall thickening inflammatory changes are seen involving small bowel loops in the pelvis most likely representing secondary inflammation.    Patient started on zosyn. Dr. Burt Knack consulted who will evaluate patient in the ED for surgical management. Patient and family updated. Vitals remain stable with no evidence of sepsis.   Alfred Levins, [place], MD 06/08/17 [phone]

## 2017-06-08 NOTE — ED Provider Notes (Signed)
[facility] Emergency Department Provider Note  ____________________________________________   First MD Initiated Contact with Patient 06/08/17 1340     (approximate)  I have reviewed the triage vital signs and the nursing notes.   HISTORY  Chief Complaint Abdominal Pain   HPI Rebecca Obrien is a 51 y.o. female with a history of a recent bowel resection one week agoof her colon secondary to diverticulitis was presenting to the emergency department today with diffuse abdominal pain. She says that she was having mild pain after the surgery but now is having a 10 out of 10 pain that is been increasing over the past several hours after having a bowel movement. She says that the bowel movement was not difficult to pass. Denies any blood in the stool. Says that she is very nauseous but has not vomited. Is not reporting a fever. Says the pain is a 10 out of 10 and cramping without radiation.   Past Medical History:  Diagnosis Date  . Arthritis    RA  . Cancer ([place])    skin melanoma  . Collagen vascular disease (HCC)    RA  . Difficult airway   . 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 Diverticulitis of sigmoid colon   . History of kidney stones   . Hyperlipidemia   . Hypertension   . Hyperthyroidism   . Hypokalemia   . Obesity   . Rheumatoid arthritis(714.0)   . Stroke [facility]) 2013   no residual    Patient Active Problem List   Diagnosis Date Noted  . Difficult intubation   . Diverticulitis of intestine with abscess 05/03/2017  . Tobacco abuse 05/02/2017  . Hypokalemia   . Hyperthyroidism   . Difficult airway   . Diverticulitis of large intestine with abscess   . Diverticulitis 04/27/2017  . Hypocalcemia 03/02/2017  . 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 S/P total thyroidectomy 01/28/2017  . 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 Abnormal TSH 08/04/2016  . Encounter for antineoplastic  chemotherapy and immunotherapy 07/13/2016  . Encounter for long-term (current) use of high-risk medication 06/23/2016  . 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 Malignant melanoma of lower leg, left ([place]) 12/03/2015  . History of stroke 12/12/2013  . Rheumatoid arthritis involving multiple sites with positive rheumatoid factor ([place]) 12/12/2013  . CVA (cerebral infarction) 07/23/2012  . Hypertension 07/23/2012    Past Surgical History:  Procedure Laterality Date  . ABDOMINAL HYSTERECTOMY    . CARPAL TUNNEL RELEASE  Bilateral  . CHOLECYSTECTOMY    . CYSTOSCOPY WITH STENT PLACEMENT Bilateral 05/31/2017   Procedure: CYSTOSCOPY WITH STENT PLACEMENT- LIGHTED STENTS;  Surgeon: Hollice Espy, MD;  Location: [facility] ORS;  Service: Urology;  Laterality: Bilateral;  . LAPAROSCOPIC SIGMOID COLECTOMY N/A 05/31/2017   Procedure: LAPAROSCOPIC SIGMOID COLECTOMY;  Surgeon: Clayburn Pert, MD;  Location: [facility] ORS;  Service: General;  Laterality: N/A;  . TEE WITHOUT CARDIOVERSION  07/26/2012   Procedure: TRANSESOPHAGEAL ECHOCARDIOGRAM (TEE);  Surgeon: Thayer Headings, MD;  Location: [place];  Service: Cardiovascular;  Laterality: N/A;  . TONSILLECTOMY    . TOTAL THYROIDECTOMY      Prior to Admission medications   Medication Sig Start Date End Date Taking? Authorizing Provider  acetaminophen (TYLENOL) 650 MG CR tablet Take 1,300 mg by mouth every 8 (eight) hours as needed for pain.    [provider]  hydrochlorothiazide (MICROZIDE) 12.5 MG capsule Take 12.5 mg by  mouth daily.  04/20/17   [provider]  leflunomide (ARAVA) 20 MG tablet Take 20 mg by mouth daily.  04/08/17 10/05/17  [provider]  levothyroxine (SYNTHROID, LEVOTHROID) 112 MCG tablet Take 112 mcg by mouth daily before breakfast.    [provider]  meloxicam (MOBIC) 15 MG tablet Take 15 mg by mouth daily.  02/04/17   [provider]  oxyCODONE-acetaminophen  (PERCOCET/ROXICET) 5-325 MG tablet Take 1-2 tablets by mouth every 4 (four) hours as needed for moderate pain. 06/03/17   Clayburn Pert, MD  potassium chloride (K-DUR) 10 MEQ tablet Take 2 tablets by mouth 2 (two) times daily with a meal.  03/02/17 03/02/18  [provider]  predniSONE (DELTASONE) 5 MG tablet Take 1-5 tablets by mouth See admin instructions. As needed for gout flare takes 5 tablets day one, 4 tablets day two, 3 tablets day three, 2 tablets day four, and 1 tablet day five. 01/26/17   [provider]  Tofacitinib Citrate 11 MG TB24 Take 11 mg by mouth daily.  02/03/17 08/02/17  [provider]    Allergies Adhesive [tape] and Latex  Family History  Problem Relation Age of Onset  . Nephrolithiasis Mother   . Hypertension Mother   . Hypertension Father   . Heart failure Father   . Cardiomyopathy Brother     Social History Social History  Substance Use Topics  . Smoking status: Current Every Day Smoker    Packs/day: 0.25    Years: 23.00    Types: Cigarettes    Last attempt to quit: 07/23/2012  . Smokeless tobacco: Never Used  . Alcohol use No    Review of Systems  Constitutional: No fever/chills Eyes: No visual changes. ENT: No sore throat. Cardiovascular: Denies chest pain. Respiratory: Denies shortness of breath. Gastrointestinal:  no vomiting.  No diarrhea.  No constipation. Genitourinary: Negative for dysuria. Musculoskeletal: Negative for back pain. Skin: Negative for rash. Neurological: Negative for headaches, focal weakness or numbness.   ____________________________________________   PHYSICAL EXAM:  VITAL SIGNS: ED Triage Vitals  Enc Vitals Group     BP      Pulse      Resp      Temp      Temp src      SpO2      Weight      Height      Head Circumference      Peak Flow      Pain Score      Pain Loc      Pain Edu?      Excl. in [place]?     Constitutional: Alert and oriented. Pt appears uncomfortable.  Moaning.    Eyes: Conjunctivae are normal.  Head: Atraumatic. Nose: No congestion/rhinnorhea. Mouth/Throat: Mucous membranes are moist.  Neck: No stridor.   Cardiovascular: Normal rate, regular rhythm. Grossly normal heart sounds.   Respiratory: Normal respiratory effort.  No retractions. Lungs CTAB. Gastrointestinal: Soft with diffuse moderate tenderness to palpation without rebound or guarding. No distention.  Musculoskeletal: No lower extremity tenderness nor edema.  No joint effusions. Neurologic:  Normal speech and language. No gross focal neurologic deficits are appreciated. Skin:  Skin is warm, dry and intact. No rash noted. Psychiatric: Mood and affect are normal. Speech and behavior are normal.  ____________________________________________   LABS (all labs ordered are listed, but only abnormal results are displayed)  Labs Reviewed  CBC WITH DIFFERENTIAL/PLATELET - Abnormal; Notable for the following:  Result Value   WBC 13.5 (*)    Hemoglobin 10.8 (*)    HCT 32.3 (*)    Neutro Abs 9.9 (*)    Monocytes Absolute 1.0 (*)    All other components within normal limits  COMPREHENSIVE METABOLIC PANEL - Abnormal; Notable for the following:    Potassium 3.4 (*)    Chloride 99 (*)    Glucose, Bld 113 (*)    Calcium 8.6 (*)    Albumin 3.1 (*)    All other components within normal limits  LIPASE, BLOOD  URINALYSIS, COMPLETE (UACMP) WITH MICROSCOPIC   ____________________________________________  EKG   ____________________________________________  RADIOLOGY  Pending CT of the abdomen and pelvis ____________________________________________   PROCEDURES  Procedure(s) performed:   Procedures  Critical Care performed:   ____________________________________________   INITIAL IMPRESSION / ASSESSMENT AND PLAN / ED COURSE  Pertinent labs & imaging results that were available during my care of the patient were reviewed by me and considered in my medical decision making  (see chart for details). DDX: Rupture of anastomosis, sepsis, diverticulitis, intra-abdominal abscess, postop pain ----------------------------------------- 3:14 PM on 06/08/2017 -----------------------------------------     patient requiring multiple doses of IV pain medications. Pending CAT scan at this time. Patient likely requires surgical evaluation. Signed out to Dr.  Alfred Levins.   ____________________________________________   FINAL CLINICAL IMPRESSION(S) / ED DIAGNOSES  Abdominal pain     NEW MEDICATIONS STARTED DURING THIS VISIT:  New Prescriptions   No medications on file     Note:  This document was prepared using Dragon voice recognition software and may include unintentional dictation errors.     Orbie Pyo, MD 06/08/17 [phone]

## 2017-06-08 NOTE — Anesthesia Procedure Notes (Addendum)
Procedure Name: Intubation Date/Time: 06/08/2017 8:51 PM Performed by: Lendon Colonel Pre-anesthesia Checklist: Patient identified, Emergency Drugs available, Suction available and Patient being monitored Patient Re-evaluated:Patient Re-evaluated prior to induction Preoxygenation: Pre-oxygenation with 100% oxygen Induction Type: IV induction, Cricoid Pressure applied and Rapid sequence Ventilation: Mask ventilation without difficulty Laryngoscope Size: Glidescope, McGraph and 3 Grade View: Grade IV Airway Equipment and Method: Stylet and Video-laryngoscopy Placement Confirmation: ETT inserted through vocal cords under direct vision,  breath sounds checked- equal and bilateral and positive ETCO2 Secured at: 21 cm Tube secured with: Tape Dental Injury: Teeth and Oropharynx as per pre-operative assessment  Difficulty Due To: Difficulty was anticipated, Difficult Airway- due to anterior larynx and Difficult Airway- due to limited oral opening Future Recommendations: Recommend- induction with short-acting agent, and alternative techniques readily available Comments: Multiple attempts required but anticipated.  Mcgrath to glidescope yielded good visualization but difficult  Passage.  Ultimately able to pass cords atraumatically .  No heme or trauma. Yielded +etco2 and bs = bil.  Greggory Brandy

## 2017-06-08 NOTE — ED Triage Notes (Signed)
Patient from home via [facility]. Reports recent bowel resection for diverticulitis. States she has been having normal bowel movements. Reports during bowel movement this morning during which she had a sharp pain to middle of abdomen. States pain has worsened since bowel movement. Patient groaning in pain upon arrival to ED. Alert and oriented x4. MD at bedside.

## 2017-06-08 NOTE — Interval H&P Note (Signed)
History and Physical Interval Note:  06/08/2017 8:28 PM  Rebecca Obrien  has presented today for surgery, with the diagnosis of na  The various methods of treatment have been discussed with the patient and family. After consideration of risks, benefits and other options for treatment, the patient has consented to  Procedure(s): EXPLORATORY LAPAROTOMY (N/A) ILEOSTOMY (N/A) as a surgical intervention .  The patient's history has been reviewed, patient examined, no change in status, stable for surgery.  I have reviewed the patient's chart and labs.  Questions were answered to the patient's satisfaction.     Vickie Epley

## 2017-06-09 ENCOUNTER — Encounter: Payer: Self-pay | Admitting: *Deleted

## 2017-06-09 ENCOUNTER — Other Ambulatory Visit: Payer: Self-pay | Admitting: *Deleted

## 2017-06-09 ENCOUNTER — Encounter: Payer: Self-pay | Admitting: Surgery

## 2017-06-09 DIAGNOSIS — Z8673 Personal history of transient ischemic attack (TIA), and cerebral infarction without residual deficits: Secondary | ICD-10-CM | POA: Diagnosis not present

## 2017-06-09 DIAGNOSIS — E785 Hyperlipidemia, unspecified: Secondary | ICD-10-CM | POA: Diagnosis present

## 2017-06-09 DIAGNOSIS — Z9049 Acquired absence of other specified parts of digestive tract: Secondary | ICD-10-CM | POA: Diagnosis not present

## 2017-06-09 DIAGNOSIS — K9189 Other postprocedural complications and disorders of digestive system: Secondary | ICD-10-CM | POA: Diagnosis not present

## 2017-06-09 DIAGNOSIS — R109 Unspecified abdominal pain: Secondary | ICD-10-CM | POA: Diagnosis not present

## 2017-06-09 DIAGNOSIS — Z85828 Personal history of other malignant neoplasm of skin: Secondary | ICD-10-CM | POA: Diagnosis not present

## 2017-06-09 DIAGNOSIS — Z9221 Personal history of antineoplastic chemotherapy: Secondary | ICD-10-CM | POA: Diagnosis not present

## 2017-06-09 DIAGNOSIS — Z91048 Other nonmedicinal substance allergy status: Secondary | ICD-10-CM | POA: Diagnosis not present

## 2017-06-09 DIAGNOSIS — Y832 Surgical operation with anastomosis, bypass or graft as the cause of abnormal reaction of the patient, or of later complication, without mention of misadventure at the time of the procedure: Secondary | ICD-10-CM | POA: Diagnosis present

## 2017-06-09 DIAGNOSIS — F17210 Nicotine dependence, cigarettes, uncomplicated: Secondary | ICD-10-CM | POA: Diagnosis present

## 2017-06-09 DIAGNOSIS — Z960 Presence of urogenital implants: Secondary | ICD-10-CM | POA: Diagnosis present

## 2017-06-09 DIAGNOSIS — Z6824 Body mass index (BMI) 24.0-24.9, adult: Secondary | ICD-10-CM | POA: Diagnosis not present

## 2017-06-09 DIAGNOSIS — Z79899 Other long term (current) drug therapy: Secondary | ICD-10-CM | POA: Diagnosis not present

## 2017-06-09 DIAGNOSIS — M069 Rheumatoid arthritis, unspecified: Secondary | ICD-10-CM | POA: Diagnosis present

## 2017-06-09 DIAGNOSIS — Z791 Long term (current) use of non-steroidal anti-inflammatories (NSAID): Secondary | ICD-10-CM | POA: Diagnosis not present

## 2017-06-09 DIAGNOSIS — I10 Essential (primary) hypertension: Secondary | ICD-10-CM | POA: Diagnosis present

## 2017-06-09 DIAGNOSIS — Z90710 Acquired absence of both cervix and uterus: Secondary | ICD-10-CM | POA: Diagnosis not present

## 2017-06-09 DIAGNOSIS — E890 Postprocedural hypothyroidism: Secondary | ICD-10-CM | POA: Diagnosis present

## 2017-06-09 DIAGNOSIS — K651 Peritoneal abscess: Secondary | ICD-10-CM | POA: Diagnosis present

## 2017-06-09 DIAGNOSIS — E669 Obesity, unspecified: Secondary | ICD-10-CM | POA: Diagnosis present

## 2017-06-09 HISTORY — DX: Other postprocedural complications and disorders of digestive system: K91.89

## 2017-06-09 MED ORDER — SODIUM CHLORIDE 0.9 % IV BOLUS (SEPSIS)
500.0000 mL | INTRAVENOUS | Status: AC
  Administered 2017-06-09: 500 mL via INTRAVENOUS

## 2017-06-09 MED ORDER — FENTANYL CITRATE (PF) 100 MCG/2ML IJ SOLN
INTRAMUSCULAR | Status: AC
  Administered 2017-06-09: 25 ug via INTRAVENOUS
  Filled 2017-06-09: qty 2

## 2017-06-09 MED ORDER — ENOXAPARIN SODIUM 40 MG/0.4ML ~~LOC~~ SOLN
40.0000 mg | SUBCUTANEOUS | Status: DC
  Administered 2017-06-10 – 2017-06-19 (×9): 40 mg via SUBCUTANEOUS
  Filled 2017-06-09 (×7): qty 0.4

## 2017-06-09 MED ORDER — OXYCODONE-ACETAMINOPHEN 5-325 MG PO TABS
1.0000 | ORAL_TABLET | ORAL | Status: DC | PRN
  Administered 2017-06-10 – 2017-06-11 (×4): 2 via ORAL
  Administered 2017-06-12: 1 via ORAL
  Administered 2017-06-12 – 2017-06-18 (×7): 2 via ORAL
  Administered 2017-06-18: 1 via ORAL
  Administered 2017-06-18 – 2017-06-19 (×4): 2 via ORAL
  Administered 2017-06-19: 1 via ORAL
  Filled 2017-06-09 (×4): qty 2
  Filled 2017-06-09: qty 1
  Filled 2017-06-09 (×5): qty 2
  Filled 2017-06-09 (×2): qty 1
  Filled 2017-06-09 (×6): qty 2

## 2017-06-09 MED ORDER — PIPERACILLIN-TAZOBACTAM 3.375 G IVPB
3.3750 g | Freq: Three times a day (TID) | INTRAVENOUS | Status: DC
  Administered 2017-06-09 – 2017-06-13 (×13): 3.375 g via INTRAVENOUS
  Filled 2017-06-09 (×13): qty 50

## 2017-06-09 MED ORDER — KETOROLAC TROMETHAMINE 30 MG/ML IJ SOLN
30.0000 mg | Freq: Four times a day (QID) | INTRAMUSCULAR | Status: AC
  Administered 2017-06-09 – 2017-06-13 (×20): 30 mg via INTRAVENOUS
  Filled 2017-06-09 (×20): qty 1

## 2017-06-09 MED ORDER — ONDANSETRON 4 MG PO TBDP
4.0000 mg | ORAL_TABLET | Freq: Four times a day (QID) | ORAL | Status: DC | PRN
  Administered 2017-06-14: 4 mg via ORAL
  Filled 2017-06-09: qty 1

## 2017-06-09 MED ORDER — INFLUENZA VAC SPLIT QUAD 0.5 ML IM SUSY
0.5000 mL | PREFILLED_SYRINGE | INTRAMUSCULAR | Status: DC
  Filled 2017-06-09: qty 0.5

## 2017-06-09 MED ORDER — FENTANYL CITRATE (PF) 100 MCG/2ML IJ SOLN
25.0000 ug | INTRAMUSCULAR | Status: AC | PRN
  Administered 2017-06-09 (×6): 25 ug via INTRAVENOUS

## 2017-06-09 MED ORDER — MORPHINE SULFATE (PF) 2 MG/ML IV SOLN
2.0000 mg | INTRAVENOUS | Status: DC | PRN
  Administered 2017-06-10 – 2017-06-18 (×17): 2 mg via INTRAVENOUS
  Filled 2017-06-09 (×17): qty 1

## 2017-06-09 MED ORDER — SODIUM CHLORIDE 0.9 % IV BOLUS (SEPSIS)
500.0000 mL | Freq: Once | INTRAVENOUS | Status: AC
  Administered 2017-06-09: 500 mL via INTRAVENOUS

## 2017-06-09 MED ORDER — LACTATED RINGERS IV BOLUS (SEPSIS)
1000.0000 mL | Freq: Once | INTRAVENOUS | Status: AC
  Administered 2017-06-09: 1000 mL via INTRAVENOUS

## 2017-06-09 MED ORDER — ONDANSETRON HCL 4 MG/2ML IJ SOLN: 4.0000 mg | Freq: Once | INTRAMUSCULAR | Status: DC | PRN

## 2017-06-09 MED ORDER — HYDROMORPHONE HCL 1 MG/ML IJ SOLN
0.5000 mg | INTRAMUSCULAR | Status: DC | PRN
  Administered 2017-06-09 – 2017-06-18 (×18): 0.5 mg via INTRAVENOUS
  Filled 2017-06-09 (×19): qty 0.5

## 2017-06-09 MED ORDER — ONDANSETRON HCL 4 MG/2ML IJ SOLN
4.0000 mg | Freq: Four times a day (QID) | INTRAMUSCULAR | Status: DC | PRN
  Administered 2017-06-13 – 2017-06-14 (×3): 4 mg via INTRAVENOUS
  Filled 2017-06-09 (×4): qty 2

## 2017-06-09 MED ORDER — KCL-LACTATED RINGERS-D5W 20 MEQ/L IV SOLN
INTRAVENOUS | Status: DC
  Administered 2017-06-09 – 2017-06-13 (×12): via INTRAVENOUS
  Filled 2017-06-09 (×16): qty 1000

## 2017-06-09 NOTE — Consult Note (Addendum)
[place] Nurse ostomy consult note Stoma type/location: RLQ ileostomy.  Has red rubber catheter retention rod in place that is sutured in place.  This creates a complex pouching situation, but we switched to a one piece convex pouch and seal is obtained.  Peristomal skin is intact.  Some effluent from pouch had leaked under honeycomb dressing.  This is peeled back and skin is cleansed today.  Noted allergy to "adhesive and tape".  No erythema or irritation is noted beneath any dressings or pouch today. Will monitor this ongoing.  Stomal assessment/size: Oval shape 2 cm wide and 0.4 cm length, loop ileostomy.  Pink patent and producing liquid green effluent. Abdomen is edematous and stoma nearly flush today.  Due to hypotension, analgesia is guarded at this time and patient has some discomfort to her abdomen.  Little teaching is accomplished today.  Peristomal assessment: intact.  Red rubber catheter sutured at 3 and 9 o'clock and peristomal skin is protected with pieces of a barrier ring.  Treatment options for stomal/peristomal skin: Switching to flexible, one piece convex system to improve seal and barrier ring, pulled into segments to protect peristomal skin Output liquid green effluent Ostomy pouching: 1pc.convex with barrier ring.  Protect peristomal skin and cover red rubber catheter  With barrier rings to create a flat pouching surface.  Education provided: Patient is in pain and groggy.  Will continue teaching when more alert.  Enrolled patient in [place] program: No  Will wait and observe how this holds up.  [place] team will follow and remain available to patient, medical and nursing teams.   Domenic Moras RN BSN [place] Pager [phone]

## 2017-06-09 NOTE — Patient Outreach (Signed)
[facility]) Care Management  06/09/2017  Rebecca Obrien 09-23-1965 [id number]   Subjective:Telephone call to patient's home / mobile number, no answer, left HIPAA compliant voicemail message, and requested call back.    Objective: Per chart review, patient hospitalized 05/2517 -05/2817 for persistent diverticulitis. Status post Laparoscopic sigmoid colectomy with immediate anastomosis, mobilization of splenic flexure on 05/31/17. Patient hospitalized 05/03/17 - 05/07/17 for Diverticulitis of intestine with abscess. Patient hospitalized 04/27/17 -04/30/17 for Diverticulitis with microperforation. Patient also has a history of Rheumatoid arthritis, hypertension, hyperlipidemia, skin melanomaand stroke.    Assessment:Received [facility] Transition of care referral on 05/27/17. Transition of care follow up pending patient contact.    Plan: RNCM will send unsuccessful outreach  letter, [facility] pamphlet, and proceed with case closure, within 10 business days if no return call.     Rebecca Obrien H. [name], BSN, [place] Management [facility] Telephonic CM Phone: [phone] Fax: [phone]

## 2017-06-09 NOTE — Anesthesia Post-op Follow-up Note (Signed)
Anesthesia QCDR form completed.        

## 2017-06-09 NOTE — Progress Notes (Signed)
Dr. Burt Knack notified of patient's low urine output throughout shift.  Order for LR bolus received.

## 2017-06-09 NOTE — Progress Notes (Signed)
Pharmacy Antibiotic Note  Rebecca Obrien is a 51 y.o. female admitted on 06/08/2017 with peritonitis.  Pharmacy has been consulted for Zosyn dosing.  Plan: Zosyn 3.375g IV q8h (4 hour infusion).  Height: 5\' 5"  (165.1 cm) Weight: 165 lb (74.8 kg) IBW/kg (Calculated) : 57  Temp (24hrs), Avg:97.9 F (36.6 C), Min:97.8 F (36.6 C), Max:98.1 F (36.7 C)   Recent Labs Lab 06/08/17 1344  WBC 13.5*  CREATININE 0.78    Estimated Creatinine Clearance: 82.3 mL/min (by C-G formula based on SCr of 0.78 mg/dL).    Allergies  Allergen Reactions  . Adhesive [Tape]     Skin breakdown/dermatitis with prolonged use   . Latex Dermatitis    NOT LATEX ALLERGIC.  Patient has problem with adhesive, not latex    Antimicrobials this admission: Zosyn 10/3  >>    >>   Dose adjustments this admission:  Microbiology results: 10/4 wound cx: pending    Thank you for allowing pharmacy to be a part of this patient's care.  Nhyla Nappi S 06/09/2017 2:44 AM

## 2017-06-09 NOTE — Op Note (Signed)
SURGICAL OPERATIVE REPORT  DATE OF PROCEDURE: 06/09/2017  ATTENDING Surgeon(s): Vickie Epley, MD  ANESTHESIA: general   PRE-OPERATIVE DIAGNOSIS: Post-operative EEA colorectal anastomotic disruption/leak (icd-10's: K91.89)  POST-OPERATIVE DIAGNOSIS: Post-operative EEA colorectal anastomotic disruption/leak (icd-10's: K91.89)  PROCEDURE(S):  1.) Exploratory laparotomy, washout of stool/abscess 2.) Placement of pelvic 36F Blake drains x 2 (cpt: 12458) 3.) Creation of diverting loop ileostomy (cpt: 09983)  INTRAOPERATIVE FINDINGS: Large volume loculated feculent peritoneal contamination/abscess with primarily filmy adhesions, some more dense adhesions between inflamed terminal ileum immediately adjacent to disrupted colorectal anastomosis  INTRAVENOUS FLUIDS: 3000 mL crystalloid   ESTIMATED BLOOD LOSS: Minimal (< 30 mL)  URINE OUTPUT: 400 mL   SPECIMENS: Source of Specimen:  Feculent intra-abdominal abscess  IMPLANTS: None  DRAINS: 36F Blake drains x 2: Right paracolic gutter, terminating to Right of anastomotic disruption and Left paracolic gutter, terminating to Left of anastomotic disruption  COMPLICATIONS: None apparent  CONDITION AT END OF PROCEDURE: Hemodynamically stable and extubated  DISPOSITION OF PATIENT: PACU  INDICATIONS FOR PROCEDURE:  51 year old Female presented to [facility] ED with acute onset of abdominal pain while passing a BM described as normal, soft, and non-bloody 8 Days s/p laparoscopic sigmoid colectomy with EEA stapled colorectal anastomosis for diverticulitis with pelvic abscess. CT imaging revealed disruption of the recent colorectal anastomosis with diffuse pneumoperitoneum and stool in patient's pelvis. All risks, benefits, and alternatives to above procedures were discussed with the patient her family, all of patient's and her family's questions were answered to their expressed satisfaction, and informed consent was accordingly obtained and  documented at that time.  DETAILS OF PROCEDURE: Patient was brought to the operating suite and appropriately identified. General anesthesia was administered along with appropriate pre-operative antibiotics, and endotracheal intubation was performed by anesthesiologist using glide scope for image-guided intubation due to patient's known difficult airway. In supine position, surgical skin staples were removed from patient's recent vertical lower midline specimen removal site, operative site was prepped and draped in the usual sterile fashion, and following a brief time out, recent lower midline incision was reopened rather easily, and intact recently placed PDS fascial suture was cut and removed. Wound was then extended apically using #10 blade scalpel and electrocautery. Upon entering the peritoneal cavity, a large volume of feculent fluid was encountered, sent for culture, and evacuated. Alexis wound protector was inserted, additional loculated cavities of feculent fluid were disrupted and likewise contained and evacuated using suction. Copious warm saline was instilled and evacuated until clear.   Patient's stomach was then palpated without appreciation of NG tube, and it was noted by anesthetist that one was not able to be placed, likely due to the same difficulty traversing her pharynx experienced with endotracheal tube. Additional likewise unsuccessful attempts were made by anesthesiologist, but were aborted due to concern regarding potential for causing trauma to patient's pharynx. Attention was then directed to identification of terminal ileum and a suitable loop of small intestine for creation of diverting loop ileostomy. This was, however, limited due to further adhesions, particularly between the terminal ileum and the site of patient's colorectal anastomotic disruption in her pelvis. Upon freeing terminal ileum from patient's pelvis, additional feculent material was encountered and evacuated, and a  short segment of terminal ileum was found to be quite significantly inflamed, though well-perfused, intact, and viable. Small intestine was then able to run antegrade to patient's ileocecal junction and retrograde until less inflamed and freely mobile ileum suitable for creation of diverting loop ileostomy was confirmed, though patient's  short mesentery was recognized at this time.   Bilateral pelvic 19 F Blake drains were placed along the pericolic gutters to the respective side of disrupted colorectal anastomosis, externalized, and secured using 3-0 nylon suture. RUQ abdominal wall site was selected for creation of diverting loop ileostomy remote from patient's lower midline at the level of appropriately healthy ileum while minimizing tension, and RUQ transverse skin incision was made and extended deep through subcutaneous tissues to fascia, which was opened and rectus muscle fibers were spread apart. Peritoneum was then entered and selected segment of ileum was externalized. To minimize tension due to aforementioned short small intestinal mesentery, the mesenteric peritoneum was carefully scored with electrocautery, and externalized ileum was secured and controlled. Additional irrigation was instilled and evacuated, and hemostasis was confirmed. Obert wound protector was then removed, and clean closure technique was then utilized by Best boy. Exparel was directly injected along fascial edges and into skin. Fascia was re-approximated using looped #1 PDS x 2, and buried interrupted 2-0 Vicryl sutures were utilized to re-approximate Camper's and Scarpa's fascia and subcutaneous tissues, followed by surgical skin staples to re-approximate skin, and sterile honeycomb dressing was applied. Ileostomy was then created and matured in standard fashion, and ostomy appliance was applied.  Patient was then safely able to be extubated, awakened, and transferred to PACU for post-operative monitoring and care.  I  was present for all aspects of the above procedure, and there were no complications apparent.

## 2017-06-09 NOTE — Transfer of Care (Signed)
Immediate Anesthesia Transfer of Care Note  Patient: Rebecca Obrien  Procedure(s) Performed: EXPLORATORY LAPAROTOMY (N/A Abdomen) ILEOSTOMY (N/A Abdomen)  Patient Location: PACU  Anesthesia Type:General  Level of Consciousness: awake, alert , oriented and patient cooperative  Airway & Oxygen Therapy: Patient Spontanous Breathing and Patient connected to face mask oxygen  Post-op Assessment: Report given to RN and Post -op Vital signs reviewed and stable  Post vital signs: Reviewed and stable  Last Vitals:  Vitals:   06/08/17 1900 06/08/17 1915  BP: (!) 129/91 126/83  Pulse: (!) 107 (!) 106  Resp: (!) 25 (!) 26  Temp:    SpO2: 96% 95%    Last Pain:  Vitals:   06/08/17 1911  TempSrc:   PainSc: 10-Worst pain ever         Complications: No apparent anesthesia complications

## 2017-06-09 NOTE — Progress Notes (Signed)
1 Day Post-Op  Subjective:  Status post diverting ileostomy and irrigation of pelvic abscess due to anastomotic leak. Today she is complaining of pain and has not been given much narcotic analgesics due to her low blood pressure. In reviewing her blood pressure over the last few admission she has never been over 6:96 or so systolic. Today her blood pressure is 90 with a heart rate in the 90s as well.  She is not dizzy does not feel faint. She has no nausea or vomiting this morning.  Objective: Vital signs in last 24 hours: Temp:  [97.3 F (36.3 C)-98.6 F (37 C)] 97.5 F (36.4 C) (10/04 0421) Pulse Rate:  [86-116] 90 (10/04 0606) Resp:  [10-28] 20 (10/04 0421) BP: (87-145)/(59-105) 89/60 (10/04 0556) SpO2:  [92 %-100 %] 97 % (10/04 0606) Weight:  [165 lb (74.8 kg)] 165 lb (74.8 kg) (10/03 1354)    Intake/Output from previous day: 10/03 0701 - 10/04 0700 In: 3381 [I.V.:3331; IV Piggyback:50] Out: 740 [Urine:625; Drains:115] Intake/Output this shift: No intake/output data recorded.  Physical exam:  Loose ostomy bag with minimal succus staining. Distended abdomen minimally tender dressings otherwise intact. Nontender calves  Lab Results: CBC   Recent Labs  06/08/17 1344  WBC 13.5*  HGB 10.8*  HCT 32.3*  PLT 388   BMET  Recent Labs  06/08/17 1344  NA 138  K 3.4*  CL 99*  CO2 26  GLUCOSE 113*  BUN 12  CREATININE 0.78  CALCIUM 8.6*   PT/INR No results for input(s): LABPROT, INR in the last 72 hours. ABG No results for input(s): PHART, HCO3 in the last 72 hours.  Invalid input(s): PCO2, PO2  Studies/Results: Ct Abdomen Pelvis W Contrast  Result Date: 06/08/2017 CLINICAL DATA:  Acute middle abdominal pain. EXAM: CT ABDOMEN AND PELVIS WITH CONTRAST TECHNIQUE: Multidetector CT imaging of the abdomen and pelvis was performed using the standard protocol following bolus administration of intravenous contrast. CONTRAST:  100 mL of Isovue-300 intravenously.  COMPARISON:  CT scan of May 16, 2017. FINDINGS: Lower chest: No acute abnormality. Hepatobiliary: Status post cholecystectomy. Stable left hepatic cyst. Pancreas: Unremarkable. No pancreatic ductal dilatation or surrounding inflammatory changes. Spleen: Normal in size without focal abnormality. Adrenals/Urinary Tract: Adrenal glands are unremarkable. Kidneys are normal, without renal calculi, focal lesion, or hydronephrosis. Bladder is unremarkable. Stomach/Bowel: The stomach appears normal. The appendix appears normal. Status post sigmoid colectomy. There is no evidence of bowel obstruction. There appears to be a large perforation at the anastomotic site between the residual colon and rectum best seen on image number 72 of series 2. There is stool and gas in this area, in pneumoperitoneum is noted throughout the abdomen. Mild wall thickening and inflammatory changes are seen involving small bowel loops in the pelvis most likely representing secondary inflammation. Vascular/Lymphatic: Aortic atherosclerosis is noted. Stable mildly enlarged retroperitoneal lymph nodes are noted which most likely are reactive in etiology. Reproductive: Status post hysterectomy. No adnexal masses. Other: Pneumoperitoneum is seen throughout the abdomen consistent with bowel perforation as described above. No abnormal hernia is noted. Musculoskeletal: No acute or significant osseous findings. IMPRESSION: Patient is status post recent sigmoid colectomy for diverticulitis. However, there appears to be a large perforation at the anastomotic site between the residual sigmoid colon and rectum in the pelvis, resulting in pneumoperitoneum throughout the abdomen and stool in the pelvis. Mild wall thickening inflammatory changes are seen involving small bowel loops in the pelvis most likely representing secondary inflammation. Critical Value/emergent results were called  by telephone at the time of interpretation on 06/08/2017 at 3:23 pm to  Dr. Alfred Levins, who verbally acknowledged these results. Electronically Signed   By: Marijo Conception, M.D.   On: 06/08/2017 15:23    Anti-infectives: Anti-infectives    Start     Dose/Rate Route Frequency Ordered Stop   06/09/17 0600  piperacillin-tazobactam (ZOSYN) IVPB 3.375 g     3.375 g 12.5 mL/hr over 240 Minutes Intravenous Every 8 hours 06/09/17 0241     06/08/17 2015  piperacillin-tazobactam (ZOSYN) IVPB 3.375 g    Comments:  SEND TO SDS   3.375 g 100 mL/hr over 30 Minutes Intravenous  Once 06/08/17 2010 06/08/17 2200   06/08/17 1530  piperacillin-tazobactam (ZOSYN) IVPB 3.375 g     3.375 g 100 mL/hr over 30 Minutes Intravenous  Once 06/08/17 1523 06/08/17 1559      Assessment/Plan: s/p Procedure(s): EXPLORATORY LAPAROTOMY ILEOSTOMY   Perforated or disrupted anastomosis in the pelvis. This was treated with wide drainage and irrigation as well as diversion via L Loop ileostomy. Day 1 and doing well with the exception of difficulty in controlling her pain due to low systolic blood pressures. Her blood pressures and always been in the 120 or lower. We'll observe today and try to improve pain control. I have started her on clear liquids today with the understanding that she would not take very much and she has not passed any gas into the ileostomy.  Florene Glen, MD, FACS  06/09/2017

## 2017-06-09 NOTE — Progress Notes (Signed)
MD notified of low urine output, low bp, pt ostomy leaking into surgical dressing. Orders for another 500cc NS bolus x 1. Md stated he would like to see SBP >90 before giving prn narcotics to treat pain. Pt made aware. Pt sleepy. Currently sleeping in bed. Will give bolus change ostomy and continue to monitor

## 2017-06-10 ENCOUNTER — Encounter: Payer: 59 | Admitting: General Surgery

## 2017-06-10 LAB — BASIC METABOLIC PANEL
ANION GAP: 6 (ref 5–15)
BUN: 22 mg/dL — ABNORMAL HIGH (ref 6–20)
CALCIUM: 7.4 mg/dL — AB (ref 8.9–10.3)
CO2: 24 mmol/L (ref 22–32)
CREATININE: 0.86 mg/dL (ref 0.44–1.00)
Chloride: 109 mmol/L (ref 101–111)
GFR calc Af Amer: >60 mL/min (ref >60)
GLUCOSE: 132 mg/dL — AB (ref 65–99)
Potassium: 4.5 mmol/L (ref 3.5–5.1)
Sodium: 139 mmol/L (ref 135–145)

## 2017-06-10 LAB — CBC
HEMATOCRIT: 26.1 % — AB (ref 35.0–47.0)
Hemoglobin: 8.9 g/dL — ABNORMAL LOW (ref 12.0–16.0)
MCH: 28.1 pg (ref 26.0–34.0)
MCHC: 34.2 g/dL (ref 32.0–36.0)
MCV: 82 fL (ref 80.0–100.0)
PLATELETS: 269 10*3/uL (ref 150–440)
RBC: 3.19 MIL/uL — ABNORMAL LOW (ref 3.80–5.20)
RDW: 14.5 % (ref 11.5–14.5)
WBC: 8.8 10*3/uL (ref 3.6–11.0)

## 2017-06-10 MED ORDER — LISINOPRIL 20 MG PO TABS
20.0000 mg | ORAL_TABLET | Freq: Every day | ORAL | Status: DC
  Administered 2017-06-10 – 2017-06-19 (×10): 20 mg via ORAL
  Filled 2017-06-10 (×10): qty 1

## 2017-06-10 MED ORDER — TOFACITINIB CITRATE ER 11 MG PO TB24
11.0000 mg | ORAL_TABLET | Freq: Every day | ORAL | Status: DC
  Administered 2017-06-11 – 2017-06-19 (×9): 11 mg via ORAL
  Filled 2017-06-10 (×7): qty 1

## 2017-06-10 MED ORDER — LEFLUNOMIDE 20 MG PO TABS
20.0000 mg | ORAL_TABLET | Freq: Every day | ORAL | Status: DC
  Administered 2017-06-10 – 2017-06-19 (×10): 20 mg via ORAL
  Filled 2017-06-10 (×12): qty 1

## 2017-06-10 MED ORDER — LEVOTHYROXINE SODIUM 100 MCG PO TABS
100.0000 ug | ORAL_TABLET | Freq: Every day | ORAL | Status: DC
  Administered 2017-06-11 – 2017-06-20 (×10): 100 ug via ORAL
  Filled 2017-06-10 (×9): qty 1

## 2017-06-10 MED ORDER — MELOXICAM 15 MG PO TABS: 15.0000 mg | ORAL_TABLET | Freq: Every day | ORAL | Status: DC

## 2017-06-10 NOTE — Progress Notes (Signed)
2 Days Post-Op  Subjective: Status post diverting ileostomy and drainage of pelvic anastomotic leak. Today she is feeling better did require a bolus yesterday but her pain is controlled and she's feeling better overall.  Eyes nose reviewed in detail  Objective: Vital signs in last 24 hours: Temp:  [97.6 F (36.4 C)-98.4 F (36.9 C)] 98.3 F (36.8 C) (10/05 0525) Pulse Rate:  [94-103] 98 (10/05 0525) Resp:  [18-28] 18 (10/05 0525) BP: (97-105)/(61-75) 105/63 (10/05 0525) SpO2:  [94 %-99 %] 94 % (10/05 0525)    Intake/Output from previous day: 10/04 0701 - 10/05 0700 In: 5247.3 [P.O.:40; I.V.:4007.3; IV Piggyback:1200] Out: 1937 [Urine:600; Drains:450; TKWIO:9735] Intake/Output this shift: No intake/output data recorded.  Physical exam:  Awake alert and oriented much more conversant today and does not appear to be in pain. Vital signs reviewed and stable Abdomen soft nontender ostomy is viable and putting out a large amount of liquid serous fluid Nontender calves  Lab Results: CBC   Recent Labs  06/08/17 1344 06/10/17 0533  WBC 13.5* 8.8  HGB 10.8* 8.9*  HCT 32.3* 26.1*  PLT 388 269   BMET  Recent Labs  06/08/17 1344 06/10/17 0533  NA 138 139  K 3.4* 4.5  CL 99* 109  CO2 26 24  GLUCOSE 113* 132*  BUN 12 22*  CREATININE 0.78 0.86  CALCIUM 8.6* 7.4*   PT/INR No results for input(s): LABPROT, INR in the last 72 hours. ABG No results for input(s): PHART, HCO3 in the last 72 hours.  Invalid input(s): PCO2, PO2  Studies/Results: Ct Abdomen Pelvis W Contrast  Result Date: 06/08/2017 CLINICAL DATA:  Acute middle abdominal pain. EXAM: CT ABDOMEN AND PELVIS WITH CONTRAST TECHNIQUE: Multidetector CT imaging of the abdomen and pelvis was performed using the standard protocol following bolus administration of intravenous contrast. CONTRAST:  100 mL of Isovue-300 intravenously. COMPARISON:  CT scan of May 16, 2017. FINDINGS: Lower chest: No acute  abnormality. Hepatobiliary: Status post cholecystectomy. Stable left hepatic cyst. Pancreas: Unremarkable. No pancreatic ductal dilatation or surrounding inflammatory changes. Spleen: Normal in size without focal abnormality. Adrenals/Urinary Tract: Adrenal glands are unremarkable. Kidneys are normal, without renal calculi, focal lesion, or hydronephrosis. Bladder is unremarkable. Stomach/Bowel: The stomach appears normal. The appendix appears normal. Status post sigmoid colectomy. There is no evidence of bowel obstruction. There appears to be a large perforation at the anastomotic site between the residual colon and rectum best seen on image number 72 of series 2. There is stool and gas in this area, in pneumoperitoneum is noted throughout the abdomen. Mild wall thickening and inflammatory changes are seen involving small bowel loops in the pelvis most likely representing secondary inflammation. Vascular/Lymphatic: Aortic atherosclerosis is noted. Stable mildly enlarged retroperitoneal lymph nodes are noted which most likely are reactive in etiology. Reproductive: Status post hysterectomy. No adnexal masses. Other: Pneumoperitoneum is seen throughout the abdomen consistent with bowel perforation as described above. No abnormal hernia is noted. Musculoskeletal: No acute or significant osseous findings. IMPRESSION: Patient is status post recent sigmoid colectomy for diverticulitis. However, there appears to be a large perforation at the anastomotic site between the residual sigmoid colon and rectum in the pelvis, resulting in pneumoperitoneum throughout the abdomen and stool in the pelvis. Mild wall thickening inflammatory changes are seen involving small bowel loops in the pelvis most likely representing secondary inflammation. Critical Value/emergent results were called by telephone at the time of interpretation on 06/08/2017 at 3:23 pm to Dr. Alfred Levins, who verbally acknowledged these results.  Electronically Signed    By: Marijo Conception, M.D.   On: 06/08/2017 15:23    Anti-infectives: Anti-infectives    Start     Dose/Rate Route Frequency Ordered Stop   06/09/17 0600  piperacillin-tazobactam (ZOSYN) IVPB 3.375 g     3.375 g 12.5 mL/hr over 240 Minutes Intravenous Every 8 hours 06/09/17 0241     06/08/17 2015  piperacillin-tazobactam (ZOSYN) IVPB 3.375 g    Comments:  SEND TO SDS   3.375 g 100 mL/hr over 30 Minutes Intravenous  Once 06/08/17 2010 06/08/17 2200   06/08/17 1530  piperacillin-tazobactam (ZOSYN) IVPB 3.375 g     3.375 g 100 mL/hr over 30 Minutes Intravenous  Once 06/08/17 1523 06/08/17 1559      Assessment/Plan: s/p Procedure(s): EXPLORATORY LAPAROTOMY ILEOSTOMY   Normal white blood cell count Patient doing better today we will watch fluid status but restart home medications for her RA.  Florene Glen, MD, FACS  06/10/2017

## 2017-06-10 NOTE — Consult Note (Signed)
[place] Nurse ostomy follow up Stoma type/location: RLQ ileostomy.  Has red rubber catheter retention rod in place that is sutured in place.  This creates a complex pouching situation.  Pouch is loose when I arrive today so we will do a pouch change.  Patient is alert and participates in care. .  Peristomal skin is intact.   Noted allergy to "adhesive and tape".  No erythema or irritation is noted beneath any dressings or pouch today. Will monitor this ongoing.  Stomal assessment/size: Oval shape 2 cm wide and 0.4 cm length, loop ileostomy.  Pink patent and producing liquid green effluent. Abdomen is edematous and stoma nearly flush today.   Peristomal assessment: intact.  Red rubber catheter sutured at 3 and 9 o'clock and peristomal skin is protected with pieces of a barrier ring.  Treatment options for stomal/peristomal skin: Skin is protected with barrier ring and will try a two piece system today.  Output liquid green effluent Ostomy pouching:  2 piece convex system with barrie rring  Protect peristomal skin and cover red rubber catheter  With barrier rings to create a flat pouching surface.  .  Education provided: Pouch change performed today.  Patient is able to empty pouch today and roll closed.  Cut barrier to fit Enrolled patient in [place] program: No  Will wait and observe how this holds up. Will see again Saturday.  [place] team will follow and remain available to patient, medical and nursing teams.   Domenic Moras RN BSN [place] Pager [phone]

## 2017-06-10 NOTE — Progress Notes (Signed)
Pharmacy Antibiotic Note  Rebecca Obrien is a 51 y.o. female admitted on 06/08/2017 with peritonitis.  Pharmacy has been consulted for piperacillin/tazobactam dosing.  Plan: Continue piperacillin/tazobactam 3.375 g IV q8h EI  Height: 5\' 5"  (165.1 cm) Weight: 165 lb (74.8 kg) IBW/kg (Calculated) : 57  Temp (24hrs), Avg:98.3 F (36.8 C), Min:98.1 F (36.7 C), Max:98.4 F (36.9 C)   Recent Labs Lab 06/08/17 1344 06/10/17 0533  WBC 13.5* 8.8  CREATININE 0.78 0.86    Estimated Creatinine Clearance: 76.6 mL/min (by C-G formula based on SCr of 0.86 mg/dL).    Allergies  Allergen Reactions  . Adhesive [Tape]     Skin breakdown/dermatitis with prolonged use   . Latex Dermatitis    NOT LATEX ALLERGIC.  Patient has problem with adhesive, not latex    Antimicrobials this admission: Piperacillin/tazobactam 10/3  >>   Microbiology results:  Thank you for allowing pharmacy to be a part of this patient's care.  Lenis Noon, PharmD, BCPS Clinical Pharmacist 06/10/2017 2:17 PM

## 2017-06-10 NOTE — Care Management (Signed)
Patient admitted from home. S/p laparotomy and creation of ileostomy. Patient was previously discharged with home IV antibiotics through [facility], but those were discontinued previous admission.  Patient has a creation of a an ileostomy.  Patient is independent at baseline, and would not meet the criteria for home bound.  WOC has been ordered, and she has enrolled the patient in the [facility] Secure start program.

## 2017-06-10 NOTE — Progress Notes (Signed)
Order received for a fan

## 2017-06-10 NOTE — Progress Notes (Signed)
  Patient visited with this morning. Please see Dr. Antionette Char notes for daily progress.  Reiterated what has happened and the ongoing plan.  Patient voiced understanding. Encourage ambulation and incentive spirometer usage.  Clayburn Pert, MD [facility]  Day ASCOM [phone] Night ASCOM [phone]

## 2017-06-11 LAB — CBC WITH DIFFERENTIAL/PLATELET
Basophils Absolute: 0 10*3/uL (ref 0–0.1)
Basophils Relative: 0 %
EOS ABS: 0.2 10*3/uL (ref 0–0.7)
EOS PCT: 2 %
HCT: 25.2 % — ABNORMAL LOW (ref 35.0–47.0)
Hemoglobin: 8.6 g/dL — ABNORMAL LOW (ref 12.0–16.0)
LYMPHS ABS: 0.7 10*3/uL — AB (ref 1.0–3.6)
LYMPHS PCT: 9 %
MCH: 27.3 pg (ref 26.0–34.0)
MCHC: 33.9 g/dL (ref 32.0–36.0)
MCV: 80.6 fL (ref 80.0–100.0)
MONOS PCT: 3 %
Monocytes Absolute: 0.2 10*3/uL (ref 0.2–0.9)
NEUTROS PCT: 86 %
Neutro Abs: 6.8 10*3/uL — ABNORMAL HIGH (ref 1.4–6.5)
PLATELETS: 305 10*3/uL (ref 150–440)
RBC: 3.13 MIL/uL — ABNORMAL LOW (ref 3.80–5.20)
RDW: 14.3 % (ref 11.5–14.5)
WBC: 7.9 10*3/uL (ref 3.6–11.0)

## 2017-06-11 LAB — BASIC METABOLIC PANEL
Anion gap: 7 (ref 5–15)
BUN: 20 mg/dL (ref 6–20)
CHLORIDE: 108 mmol/L (ref 101–111)
CO2: 26 mmol/L (ref 22–32)
CREATININE: 0.75 mg/dL (ref 0.44–1.00)
Calcium: 7.5 mg/dL — ABNORMAL LOW (ref 8.9–10.3)
GFR calc non Af Amer: >60 mL/min (ref >60)
Glucose, Bld: 110 mg/dL — ABNORMAL HIGH (ref 65–99)
POTASSIUM: 4 mmol/L (ref 3.5–5.1)
SODIUM: 141 mmol/L (ref 135–145)

## 2017-06-11 NOTE — Progress Notes (Signed)
3 Days Post-Op  Subjective: Status post drainage of pelvic abscess and diverting ileostomy for anastomotic leak. Patient feels much better today and is doing well wants to advance her diet.  Objective: Vital signs in last 24 hours: Temp:  [97.8 F (36.6 C)-98.2 F (36.8 C)] 97.8 F (36.6 C) (10/06 0255) Pulse Rate:  [84-95] 84 (10/06 0255) Resp:  [20-22] 20 (10/06 0255) BP: (96-120)/(51-71) 101/51 (10/06 0255) SpO2:  [97 %-98 %] 97 % (10/06 0255) Last BM Date: 06/10/17  Intake/Output from previous day: 10/05 0701 - 10/06 0700 In: 2928.3 [P.O.:120; I.V.:2708.3; IV Piggyback:100] Out: 2800 [Urine:600; Drains:75; DDUKG:2542] Intake/Output this shift: No intake/output data recorded.  Physical exam:  Vital signs are stable Abdomen is soft nondistended minimally tender. There is some output into the ileostomy. Calves are nontender  Lab Results: CBC   Recent Labs  06/10/17 0533 06/11/17 0405  WBC 8.8 7.9  HGB 8.9* 8.6*  HCT 26.1* 25.2*  PLT 269 305   BMET  Recent Labs  06/10/17 0533 06/11/17 0405  NA 139 141  K 4.5 4.0  CL 109 108  CO2 24 26  GLUCOSE 132* 110*  BUN 22* 20  CREATININE 0.86 0.75  CALCIUM 7.4* 7.5*   PT/INR No results for input(s): LABPROT, INR in the last 72 hours. ABG No results for input(s): PHART, HCO3 in the last 72 hours.  Invalid input(s): PCO2, PO2  Studies/Results: No results found.  Anti-infectives: Anti-infectives    Start     Dose/Rate Route Frequency Ordered Stop   06/09/17 0600  piperacillin-tazobactam (ZOSYN) IVPB 3.375 g     3.375 g 12.5 mL/hr over 240 Minutes Intravenous Every 8 hours 06/09/17 0241     06/08/17 2015  piperacillin-tazobactam (ZOSYN) IVPB 3.375 g    Comments:  SEND TO SDS   3.375 g 100 mL/hr over 30 Minutes Intravenous  Once 06/08/17 2010 06/08/17 2200   06/08/17 1530  piperacillin-tazobactam (ZOSYN) IVPB 3.375 g     3.375 g 100 mL/hr over 30 Minutes Intravenous  Once 06/08/17 1523 06/08/17 1559       Assessment/Plan: s/p Procedure(s): EXPLORATORY LAPAROTOMY ILEOSTOMY   Will advance to full liquid diet at this time. Patient doing quite well.  Florene Glen, MD, FACS  06/11/2017

## 2017-06-11 NOTE — Consult Note (Signed)
[facility] Nurse ostomy follow up Stoma type/location: RLQ Ileostomy  Reinforce teaching today.  Output 300 cc green liquid effluent emptied from pouch.  Discussed emptying when 1/3 full to avoid losing seal.  It was over full this morning.  Verbalizes understanding and agrees to call staff for help with emptying when needed.  Ostomy pouching: 2pc. Convex sytem with barrier ring Education provided: Patient walked with me to the bathroom, emptied her pouch and cleansed pouch.  Rolled closed and ambulated back to bed.  She is feeling comfortable with emptying her pouch.  We will do a pouch change Monday AM.  This pouch was placed yesterday and I would like to assess wear time with this pouching system.  Enrolled patient in [place] Discharge program: Yes will today Recommend [place] for ongoing teaching after discharge.  Domenic Moras RN BSN [place] Pager [phone]

## 2017-06-12 LAB — CBC
HEMATOCRIT: 26.6 % — AB (ref 35.0–47.0)
HEMOGLOBIN: 9.3 g/dL — AB (ref 12.0–16.0)
MCH: 28.6 pg (ref 26.0–34.0)
MCHC: 34.9 g/dL (ref 32.0–36.0)
MCV: 81.9 fL (ref 80.0–100.0)
Platelets: 347 10*3/uL (ref 150–440)
RBC: 3.24 MIL/uL — AB (ref 3.80–5.20)
RDW: 14.4 % (ref 11.5–14.5)
WBC: 8.8 10*3/uL (ref 3.6–11.0)

## 2017-06-12 MED ORDER — ALUM & MAG HYDROXIDE-SIMETH 200-200-20 MG/5ML PO SUSP
30.0000 mL | Freq: Four times a day (QID) | ORAL | Status: DC | PRN
  Administered 2017-06-12: 30 mL via ORAL
  Filled 2017-06-12: qty 30

## 2017-06-12 NOTE — Progress Notes (Signed)
4 Days Post-Op  Subjective: Patient feeling much better every day. She prefers not to be in the bed because it hurts her back and is sitting up at the bedside appearing quite well. She has no nausea vomiting. She wants to advance her diet.  Objective: Vital signs in last 24 hours: Temp:  [97.8 F (36.6 C)-98.1 F (36.7 C)] 98.1 F (36.7 C) (10/07 0440) Pulse Rate:  [83-86] 84 (10/07 0440) Resp:  [18-19] 19 (10/07 0440) BP: (106-128)/(61-76) 114/68 (10/07 0440) SpO2:  [93 %-99 %] 93 % (10/07 0440) Last BM Date: 06/11/17  Intake/Output from previous day: 10/06 0701 - 10/07 0700 In: 3683.3 [I.V.:3483.3; IV Piggyback:200] Out: 7619 [Urine:1800; Drains:50; JKDTO:6712] Intake/Output this shift: No intake/output data recorded.  Physical exam:  Ostomy output is present wound is clean abdomen is soft and nontender. Nontender calves.  Lab Results: CBC   Recent Labs  06/11/17 0405 06/12/17 0337  WBC 7.9 8.8  HGB 8.6* 9.3*  HCT 25.2* 26.6*  PLT 305 347   BMET  Recent Labs  06/10/17 0533 06/11/17 0405  NA 139 141  K 4.5 4.0  CL 109 108  CO2 24 26  GLUCOSE 132* 110*  BUN 22* 20  CREATININE 0.86 0.75  CALCIUM 7.4* 7.5*   PT/INR No results for input(s): LABPROT, INR in the last 72 hours. ABG No results for input(s): PHART, HCO3 in the last 72 hours.  Invalid input(s): PCO2, PO2  Studies/Results: No results found.  Anti-infectives: Anti-infectives    Start     Dose/Rate Route Frequency Ordered Stop   06/09/17 0600  piperacillin-tazobactam (ZOSYN) IVPB 3.375 g     3.375 g 12.5 mL/hr over 240 Minutes Intravenous Every 8 hours 06/09/17 0241     06/08/17 2015  piperacillin-tazobactam (ZOSYN) IVPB 3.375 g    Comments:  SEND TO SDS   3.375 g 100 mL/hr over 30 Minutes Intravenous  Once 06/08/17 2010 06/08/17 2200   06/08/17 1530  piperacillin-tazobactam (ZOSYN) IVPB 3.375 g     3.375 g 100 mL/hr over 30 Minutes Intravenous  Once 06/08/17 1523 06/08/17 1559       Assessment/Plan: s/p Procedure(s): EXPLORATORY LAPAROTOMY ILEOSTOMY   There is purulence and one of the drains. We'll continue IV antibiotics for now but advance diet as she is diverted. Patient doing better overall.  Florene Glen, MD, FACS  06/12/2017

## 2017-06-13 ENCOUNTER — Telehealth: Payer: Self-pay | Admitting: General Practice

## 2017-06-13 MED ORDER — DEXTROSE 5 % IV SOLN
2.0000 g | Freq: Three times a day (TID) | INTRAVENOUS | Status: DC
  Administered 2017-06-13 – 2017-06-20 (×21): 2 g via INTRAVENOUS
  Filled 2017-06-13 (×25): qty 2

## 2017-06-13 NOTE — Anesthesia Postprocedure Evaluation (Signed)
Anesthesia Post Note  Patient: Rebecca Obrien  Procedure(s) Performed: EXPLORATORY LAPAROTOMY (N/A Abdomen) ILEOSTOMY (N/A Abdomen)  Patient location during evaluation: PACU Anesthesia Type: General Level of consciousness: awake and alert Pain management: pain level controlled Vital Signs Assessment: post-procedure vital signs reviewed and stable Respiratory status: spontaneous breathing, nonlabored ventilation, respiratory function stable and patient connected to nasal cannula oxygen Cardiovascular status: blood pressure returned to baseline and stable Postop Assessment: no apparent nausea or vomiting Anesthetic complications: no     Last Vitals:  Vitals:   06/12/17 2017 06/13/17 0457  BP: 127/82 (!) 141/80  Pulse: 92 (!) 105  Resp: 18 19  Temp: 36.7 C 36.9 C  SpO2: 98% 95%    Last Pain:  Vitals:   06/13/17 0628  TempSrc:   PainSc: 0-No pain                 Molli Barrows

## 2017-06-13 NOTE — Care Management (Signed)
[facility] has accepted referral for home health nurse for follow up ostomy care.

## 2017-06-13 NOTE — Progress Notes (Signed)
[place] Hospital Day(s): 4.   Post op day(s): 5 Days Post-Op.   Interval History: Patient seen and examined, no acute events or new complaints overnight. Patient reports she feels overall well, denies abdominal pain, N/V, fever/chills, CP, or SOB and says she has been ambulating in the halls without difficulty.  Review of Systems:  Constitutional: denies fever, chills  HEENT: denies cough or congestion  Respiratory: denies any shortness of breath  Cardiovascular: denies chest pain or palpitations  Gastrointestinal: abdominal pain, N/V, and bowel function as per interval history Genitourinary: denies burning with urination or urinary frequency Musculoskeletal: denies pain, decreased motor or sensation Integumentary: denies any other rashes or skin discolorations except post-surgical abdominal wounds Neurological: denies HA or vision/hearing changes   Vital signs in last 24 hours: [min-max] current  Temp:  [97.7 F (36.5 C)-98.5 F (36.9 C)] 98.5 F (36.9 C) (10/08 0457) Pulse Rate:  [92-105] 105 (10/08 0457) Resp:  [18-22] 19 (10/08 0457) BP: (127-144)/(80-94) 141/80 (10/08 0457) SpO2:  [95 %-98 %] 95 % (10/08 0457)     Height: 5\' 5"  (165.1 cm) Weight: 165 lb (74.8 kg) BMI (Calculated): 27.46   Intake/Output this shift:  No intake/output data recorded.   Intake/Output last 2 shifts:  @IOLAST2SHIFTS @   Physical Exam:  Constitutional: alert, cooperative and no distress  HENT: normocephalic without obvious abnormality  Eyes: PERRL, EOM's grossly intact and symmetric  Neuro: CN II - XII grossly intact and symmetric without deficit  Respiratory: breathing non-labored at rest  Cardiovascular: regular rate and sinus rhythm  Gastrointestinal: soft, non-tender, and non-distended with purulent fluid draining from R > L drains and from lower half of incision without surrounding erythema, ileostomy with appropriate enteric contents in ostomy bag Musculoskeletal: UE  and LE FROM, no edema or wounds, motor and sensation grossly intact, NT   Labs:  CBC Latest Ref Rng & Units 06/12/2017 06/11/2017 06/10/2017  WBC 3.6 - 11.0 K/uL 8.8 7.9 8.8  Hemoglobin 12.0 - 16.0 g/dL 9.3(L) 8.6(L) 8.9(L)  Hematocrit 35.0 - 47.0 % 26.6(L) 25.2(L) 26.1(L)  Platelets 150 - 440 K/uL 347 305 269   CMP Latest Ref Rng & Units 06/11/2017 06/10/2017 06/08/2017  Glucose 65 - 99 mg/dL 110(H) 132(H) 113(H)  BUN 6 - 20 mg/dL 20 22(H) 12  Creatinine 0.44 - 1.00 mg/dL 0.75 0.86 0.78  Sodium 135 - 145 mmol/L 141 139 138  Potassium 3.5 - 5.1 mmol/L 4.0 4.5 3.4(L)  Chloride 101 - 111 mmol/L 108 109 99(L)  CO2 22 - 32 mmol/L 26 24 26   Calcium 8.9 - 10.3 mg/dL 7.5(L) 7.4(L) 8.6(L)  Total Protein 6.5 - 8.1 g/dL - - 7.2  Total Bilirubin 0.3 - 1.2 mg/dL - - 1.1  Alkaline Phos 38 - 126 U/L - - 84  AST 15 - 41 U/L - - 18  ALT 14 - 54 U/L - - 16   Imaging studies: No new pertinent imaging studies   Assessment/Plan: (ICD-10's: K91.89) 51 y.o. female with well-drained intra-abdominal abscess 5 Days Post-Op s/p exploratory laparotomy with washout of intra-abdominal stool/abscess, placement of intra-abdominal drains x 2, and creation of diverting ileostomy for post-op anastomotic disruption following laparoscopic sigmoid colectomy for subacute diverticulitis with former pelvic abscess, complicated by pertinent comorbidities including chronic rheumatoid arthritis, HTN, HLD, hyperthyroidism, melanoma, and history of stroke.   - pain control prn, minimize narcotics  - switched from Zosyn to Cefepime per culture sensitivities  - anticipate repeat CT within ~48 hours to assess for undrained fluid  collections (prior to discharge)  - will remove 1 - 2 staples from lower midline wound for purulent drainage from both drains and wound  - change wound dry gauze dressing and packing BID + prn, discussed with RN  - medical management of medical comorbidities  - DVT prophylaxis, ambulation encouraged  All of  the above findings and recommendations were discussed with the patient, patient's family, and patient's RN, and all of patient's and family's questions were answered to their expressed satisfaction.  -- Marilynne Drivers Rosana Hoes, MD, [place]: [facility] - Partnering for exceptional care. Office: [phone]

## 2017-06-13 NOTE — Care Management (Signed)
Patient will qualify for "homebound status" as she is essentially "incontinent"  with new ostomy and not proficient the the care. Patient has no agency preference.  [facility] is not able to accept home health nurse cases at present due to staffing issues.

## 2017-06-13 NOTE — Progress Notes (Signed)
Antibiotic Note:  Piperacillin/tazobactam discontinued and antibiotics changed to cefepime per discussion with MD based on culture sensitivities.  Lenis Noon, PharmD, BCPS 06/13/17 9:30 AM

## 2017-06-13 NOTE — Plan of Care (Signed)
Problem: Activity: Goal: Risk for activity intolerance will decrease Outcome: Progressing Pt ambulated multiple times around nurses station

## 2017-06-13 NOTE — Telephone Encounter (Signed)
Returned phone call to Rebecca Obrien. I explained that since she was still hospitalized that all notes were not able to be sent until discharge. She verbalizes understanding.

## 2017-06-13 NOTE — Telephone Encounter (Signed)
Patient is calling said she spoke with someone on Friday about some paperwork from [place], and is asking if we received it, or if anything has been updated with her leave. Please call patient and advice.

## 2017-06-14 LAB — AEROBIC/ANAEROBIC CULTURE W GRAM STAIN (SURGICAL/DEEP WOUND)

## 2017-06-14 LAB — AEROBIC/ANAEROBIC CULTURE (SURGICAL/DEEP WOUND)

## 2017-06-14 MED ORDER — ENSURE ENLIVE PO LIQD
237.0000 mL | Freq: Two times a day (BID) | ORAL | Status: DC
  Administered 2017-06-14 – 2017-06-19 (×7): 237 mL via ORAL

## 2017-06-14 NOTE — Care Management (Signed)
Barrier:  Reported that patient still has nausea and unable to tolerate PO.  Planning for discharge home with home health services through [facility]

## 2017-06-14 NOTE — Progress Notes (Signed)
[place] Hospital Day(s): 5.   Post op day(s): 6 Days Post-Op.   Interval History: Patient seen and examined, no acute events or new complaints overnight. Patient reports she is feeling overall well with minimal well-controlled abdominal pain, denies N/V, has been tolerating soft diet despite limited appetite and ambulating without difficulty.  Review of Systems:  Constitutional: denies fever, chills  HEENT: denies cough or congestion  Respiratory: denies any shortness of breath  Cardiovascular: denies chest pain or palpitations  Gastrointestinal: abdominal pain, N/V, and bowel function as per interval history Genitourinary: denies burning with urination or urinary frequency Musculoskeletal: denies pain, decreased motor or sensation Integumentary: denies any other rashes or skin discolorations except post-surgical abdominal wounds Neurological: denies HA or vision/hearing changes   Vital signs in last 24 hours: [min-max] current  Temp:  [98.2 F (36.8 C)-98.6 F (37 C)] 98.2 F (36.8 C) (10/09 0414) Pulse Rate:  [92-106] 106 (10/09 0414) Resp:  [16-20] 20 (10/09 0414) BP: (150-158)/(76-88) 154/78 (10/09 0414) SpO2:  [96 %-100 %] 96 % (10/09 0414)     Height: 5\' 5"  (165.1 cm) Weight: 165 lb (74.8 kg) BMI (Calculated): 27.46   Intake/Output this shift:  No intake/output data recorded.   Intake/Output last 2 shifts:  @IOLAST2SHIFTS @   Physical Exam:  Constitutional: alert, cooperative and no distress  HENT: normocephalic without obvious abnormality  Eyes: PERRL, EOM's grossly intact and symmetric  Neuro: CN II - XII grossly intact and symmetric without deficit  Respiratory: breathing non-labored at rest  Cardiovascular: regular rate and sinus rhythm  Gastrointestinal: soft, non-tender, and non-distended with purulent fluid draining from R > L drains and from mid-vertical midline laparotomy incision where staples were removed without surrounding erythema,  ileostomy with appropriate enteric contents in ostomy bag Musculoskeletal: UE and LE FROM, motor and sensation grossly intact, NT   Labs:  CBC Latest Ref Rng & Units 06/12/2017 06/11/2017 06/10/2017  WBC 3.6 - 11.0 K/uL 8.8 7.9 8.8  Hemoglobin 12.0 - 16.0 g/dL 9.3(L) 8.6(L) 8.9(L)  Hematocrit 35.0 - 47.0 % 26.6(L) 25.2(L) 26.1(L)  Platelets 150 - 440 K/uL 347 305 269   CMP Latest Ref Rng & Units 06/11/2017 06/10/2017 06/08/2017  Glucose 65 - 99 mg/dL 110(H) 132(H) 113(H)  BUN 6 - 20 mg/dL 20 22(H) 12  Creatinine 0.44 - 1.00 mg/dL 0.75 0.86 0.78  Sodium 135 - 145 mmol/L 141 139 138  Potassium 3.5 - 5.1 mmol/L 4.0 4.5 3.4(L)  Chloride 101 - 111 mmol/L 108 109 99(L)  CO2 22 - 32 mmol/L 26 24 26   Calcium 8.9 - 10.3 mg/dL 7.5(L) 7.4(L) 8.6(L)  Total Protein 6.5 - 8.1 g/dL - - 7.2  Total Bilirubin 0.3 - 1.2 mg/dL - - 1.1  Alkaline Phos 38 - 126 U/L - - 84  AST 15 - 41 U/L - - 18  ALT 14 - 54 U/L - - 16   Imaging studies: No new pertinent imaging studies   Assessment/Plan: (ICD-10's: K59.89) 51 y.o. female with well-drained intra-abdominal abscess 6 Days Post-Op s/p exploratory laparotomy with washout of intra-abdominal stool/abscess, placement of intra-abdominal drains x 2, and creation of diverting ileostomy for post-op anastomotic disruption following laparoscopic sigmoid colectomy for subacute diverticulitis with former pelvic abscess, complicated by pertinent comorbidities including chronic rheumatoid arthritis, HTN, HLD, hyperthyroidism, melanoma, and history of stroke.              - pain control prn, minimize narcotics             -  switched 10/8 from Zosyn to Cefepime per culture sensitivities             - anticipate repeat CT within tomorrow to assess for undrained fluid collections (prior to discharge)             - continue BID dry gauze packing and dressing + prn as discussed with RN and patient  - discharge planning, possibly with IV antibiotics if no effective oral equivalent              - medical management of medical comorbidities             - DVT prophylaxis, ambulation encouraged  All of the above findings and recommendations were discussed with the patient, patient's family, and patient's RN, and all of patient's and family's questions were answered to their expressed satisfaction.  -- Marilynne Drivers Rosana Hoes, MD, [place]: [facility] - Partnering for exceptional care. Office: [phone]

## 2017-06-14 NOTE — Progress Notes (Signed)
Initial Nutrition Assessment  DOCUMENTATION CODES:   Not applicable  INTERVENTION:   Ensure Enlive po BID, each supplement provides 350 kcal and 20 grams of protein  NUTRITION DIAGNOSIS:   Inadequate oral intake related to acute illness, nausea, poor appetite as evidenced by meal completion < 25%.  GOAL:   Patient will meet greater than or equal to 90% of their needs  MONITOR:   PO intake, Supplement acceptance, Labs, Weight trends, I & O's  REASON FOR ASSESSMENT:   LOS    ASSESSMENT:   51 y.o. female with well-drained intra-abdominal abscess s/p exploratory laparotomy with washout of intra-abdominal stool/abscess, placement of intra-abdominal drains x 2, and creation of diverting ileostomy for post-op anastomotic disruption following laparoscopic sigmoid colectomy for subacute diverticulitis with former pelvic abscess, complicated by pertinent comorbidities including chronic rheumatoid arthritis, HTN, HLD, hyperthyroidism, melanoma, and history of stroke.   Met with pt in room today. Pt reports good appetite and oral intake pta. Pt reports continued poor appetite with nausea since admit. Pt reports that she has only been able to keep down ice chips today. Per chart, pt is weight stable; pt has not been weighed since admit. RD discussed the importance of adequate protein intake for healing and to preserve lean muscle mass. Pt would like to have Ensure.   Medications reviewed and include: lovenox, synthroid, cefepime, morphine, zofran  Labs reviewed: Hgb 9.3(L), Hct 26.6(L)- 10/7  Nutrition-Focused physical exam completed. Findings are no fat depletion, no muscle depletion, and no edema.   Diet Order:  DIET SOFT Room service appropriate? Yes; Fluid consistency: Thin  Skin:  Wound (see comment) (incision abdomen )  Last BM:  10/9- ostomy   Height:   Ht Readings from Last 1 Encounters:  06/08/17 _0  (1.651 m)    Weight:   Wt Readings from Last 1 Encounters:   06/08/17 165 lb (74.8 kg)    Ideal Body Weight:  56.8 kg  BMI:  Body mass index is 27.46 kg/m.  Estimated Nutritional Needs:   Kcal:  1600-1900kcal/day   Protein:  75-90g/day   Fluid:  >1.6L/day   EDUCATION NEEDS:   Education needs addressed  Koleen Distance MS, RD, LDN Pager #[phone] After Hours Pager: [phone]

## 2017-06-15 LAB — CREATININE, SERUM
CREATININE: 0.81 mg/dL (ref 0.44–1.00)
GFR calc Af Amer: >60 mL/min (ref >60)
GFR calc non Af Amer: >60 mL/min (ref >60)

## 2017-06-15 NOTE — Consult Note (Addendum)
Called back to bedside due to pouch leaking.  Able to find one convex pouch to try, however it is a 2 1/4" which is a little small, I have had to cut it to the edge.  Had to place barrier ring around the stoma, over the red tubing from the rod. Added one half of another barrier ring to 7-8 o'clock area, this is where the os is and is pointed downward. Ileostomy putting out copious yellow liquid non stop.  Difficult to get a  seal in between stool fecal effluent leaking. Have waited in room for 15 minutes watching pouch seal.  Still sealed at this point. Have instructed pt on why emptying frequently is important to prevent pulling down and leaking of pouch. We will follow this patient and remain available to this patient, nursing, and the medical and surgical teams.  Fara Olden, RN-C, WTA-C, [place] [facility] Ost[facility] Care Associate

## 2017-06-15 NOTE — Consult Note (Addendum)
[facility] Nurse ostomy follow up Stoma type/location: RLQ Ileostomy with sutured in retention rod. Sutures are very close to end of tubing thus making it hard to pouch around. Os is oval 1 1/4" top to bottom and 1 3/4" side to side. Discharge instructions being reviewed. HHRN will need to be the one changing pouch until the rod is removed. Output 50cc green liquid effluent emptied from pouch.  Ostomy pouching: 2 piece Convex sytem with barrier ring Education provided: Pt feels confident in emptying pouch and rolling and locking. Wants to replace system today while sitting in chair, she states that is how she will do it at home.  Reviewed using the template I will leave for her, how to mark and cut the wafer.  Enrolled patient in [place] Start Discharge program: No, done previously Pt is being set up with [facility] via the CM. 5 set ups ordered from [place] for pt to take home.   Fara Olden, RN-C, WTA-C, [place] [facility] Ostomy Care Associate

## 2017-06-16 ENCOUNTER — Inpatient Hospital Stay: Payer: 59

## 2017-06-16 MED ORDER — ACETAMINOPHEN 325 MG PO TABS: 650.0000 mg | ORAL_TABLET | Freq: Three times a day (TID) | ORAL | Status: DC | PRN

## 2017-06-16 MED ORDER — IOPAMIDOL (ISOVUE-300) INJECTION 61%
100.0000 mL | Freq: Once | INTRAVENOUS | Status: AC | PRN
  Administered 2017-06-16: 100 mL via INTRAVENOUS

## 2017-06-16 MED ORDER — IOPAMIDOL (ISOVUE-300) INJECTION 61%
15.0000 mL | INTRAVENOUS | Status: AC
  Administered 2017-06-16 (×2): 15 mL via ORAL

## 2017-06-16 NOTE — Progress Notes (Signed)
8 Days Post-Op  Subjective: Patient describes some right upper quadrant pain and points to the lateral side of her abdomen. Otherwise she is doing well ostomy is functional she is tolerating a diet.  Objective: Vital signs in last 24 hours: Temp:  [98.2 F (36.8 C)-99.3 F (37.4 C)] 99.3 F (37.4 C) (10/10 2031) Pulse Rate:  [78-93] 78 (10/11 0955) Resp:  [16-18] 18 (10/11 0955) BP: (110-137)/(64-76) 110/64 (10/11 0955) SpO2:  [94 %-98 %] 94 % (10/11 0955) Last BM Date: 06/15/17  Intake/Output from previous day: 10/10 0701 - 10/11 0700 In: 250 [P.O.:100; IV Piggyback:150] Out: 2995 [Urine:1300; Drains:20; TKZSW:1093] Intake/Output this shift: No intake/output data recorded.  Physical exam:  Abdomen is soft and minimally distended drain is in place 2 both with purulence. Adequate drainage obtained. Wound is clean ostomy is functional Nontender calves  Lab Results: CBC  No results for input(s): WBC, HGB, HCT, PLT in the last 72 hours. BMET  Recent Labs  06/15/17 0656  CREATININE 0.81   PT/INR No results for input(s): LABPROT, INR in the last 72 hours. ABG No results for input(s): PHART, HCO3 in the last 72 hours.  Invalid input(s): PCO2, PO2  Studies/Results: No results found.  Anti-infectives: Anti-infectives    Start     Dose/Rate Route Frequency Ordered Stop   06/13/17 1030  ceFEPIme (MAXIPIME) 2 g in dextrose 5 % 50 mL IVPB     2 g 100 mL/hr over 30 Minutes Intravenous Every 8 hours 06/13/17 0929     06/09/17 0600  piperacillin-tazobactam (ZOSYN) IVPB 3.375 g  Status:  Discontinued     3.375 g 12.5 mL/hr over 240 Minutes Intravenous Every 8 hours 06/09/17 0241 06/13/17 0928   06/08/17 2015  piperacillin-tazobactam (ZOSYN) IVPB 3.375 g    Comments:  SEND TO SDS   3.375 g 100 mL/hr over 30 Minutes Intravenous  Once 06/08/17 2010 06/08/17 2200   06/08/17 1530  piperacillin-tazobactam (ZOSYN) IVPB 3.375 g     3.375 g 100 mL/hr over 30 Minutes Intravenous   Once 06/08/17 1523 06/08/17 1559      Assessment/Plan: s/p Procedure(s): EXPLORATORY LAPAROTOMY ILEOSTOMY   Will order CT scan for today to review drainage of the pelvis to ensure that there is no undrained fluid collection and to look at the right upper quadrant. I will repeat labs in the morning as well.  Florene Glen, MD, FACS  06/16/2017

## 2017-06-17 LAB — BASIC METABOLIC PANEL
ANION GAP: 10 (ref 5–15)
BUN: 9 mg/dL (ref 6–20)
CALCIUM: 7.6 mg/dL — AB (ref 8.9–10.3)
CO2: 27 mmol/L (ref 22–32)
Chloride: 98 mmol/L — ABNORMAL LOW (ref 101–111)
Creatinine, Ser: 0.6 mg/dL (ref 0.44–1.00)
GFR calc non Af Amer: >60 mL/min (ref >60)
Glucose, Bld: 111 mg/dL — ABNORMAL HIGH (ref 65–99)
Potassium: 3.1 mmol/L — ABNORMAL LOW (ref 3.5–5.1)
Sodium: 135 mmol/L (ref 135–145)

## 2017-06-17 LAB — CBC WITH DIFFERENTIAL/PLATELET
BASOS ABS: 0 10*3/uL (ref 0–0.1)
BASOS PCT: 0 %
Eosinophils Absolute: 0 10*3/uL (ref 0–0.7)
Eosinophils Relative: 0 %
HEMATOCRIT: 27.1 % — AB (ref 35.0–47.0)
HEMOGLOBIN: 9.3 g/dL — AB (ref 12.0–16.0)
Lymphocytes Relative: 10 %
Lymphs Abs: 1.5 10*3/uL (ref 1.0–3.6)
MCH: 26.9 pg (ref 26.0–34.0)
MCHC: 34.2 g/dL (ref 32.0–36.0)
MCV: 78.5 fL — ABNORMAL LOW (ref 80.0–100.0)
Monocytes Absolute: 1.2 10*3/uL — ABNORMAL HIGH (ref 0.2–0.9)
Monocytes Relative: 8 %
NEUTROS ABS: 11.4 10*3/uL — AB (ref 1.4–6.5)
NEUTROS PCT: 82 %
Platelets: 537 10*3/uL — ABNORMAL HIGH (ref 150–440)
RBC: 3.46 MIL/uL — ABNORMAL LOW (ref 3.80–5.20)
RDW: 15 % — AB (ref 11.5–14.5)
WBC: 14.1 10*3/uL — AB (ref 3.6–11.0)

## 2017-06-17 NOTE — Plan of Care (Signed)
Problem: Physical Regulation: Goal: Will remain free from infection Outcome: Progressing Pt receiving IV cefepime

## 2017-06-17 NOTE — Progress Notes (Signed)
9 Days Post-Op  Subjective: Status post protective ileostomy for anastomotic leak and pelvic abscess drainage. CT scan yesterday demonstrated improvement but there was some small undrained fluid collections in the left side. Today Rebecca Obrien complains of right upper quadrant pain some pelvic pain but is overall feeling much better Rebecca Obrien has no left lower quadrant pain.  Objective: Vital signs in last 24 hours: Temp:  [98 F (36.7 C)-99.1 F (37.3 C)] 98 F (36.7 C) (10/12 0423) Pulse Rate:  [78-89] 89 (10/12 0423) Resp:  [17-18] 17 (10/12 0423) BP: (110-127)/(64-73) 120/73 (10/12 0423) SpO2:  [93 %-94 %] 94 % (10/12 0423) Last BM Date: 06/16/17  Intake/Output from previous day: 10/11 0701 - 10/12 0700 In: 430 [P.O.:280; IV Piggyback:150] Out: 3310 [Urine:1800; Drains:10; DGUYQ:0347] Intake/Output this shift: No intake/output data recorded.  Physical exam:  Soft nontender abdomen wound is opened and one area with packing and purulence. There is purulence in both drains. There is no erythema abdomen is otherwise soft and nontender and ileostomy is functional.  Lab Results: CBC   Recent Labs  06/17/17 0343  WBC 14.1*  HGB 9.3*  HCT 27.1*  PLT 537*   BMET  Recent Labs  06/15/17 0656 06/17/17 0343  NA  --  135  K  --  3.1*  CL  --  98*  CO2  --  27  GLUCOSE  --  111*  BUN  --  9  CREATININE 0.81 0.60  CALCIUM  --  7.6*   PT/INR No results for input(s): LABPROT, INR in the last 72 hours. ABG No results for input(s): PHART, HCO3 in the last 72 hours.  Invalid input(s): PCO2, PO2  Studies/Results: Ct Abdomen Pelvis W Contrast  Result Date: 06/16/2017 CLINICAL DATA:  Right upper quadrant abdominal pain. EXAM: CT ABDOMEN AND PELVIS WITH CONTRAST TECHNIQUE: Multidetector CT imaging of the abdomen and pelvis was performed using the standard protocol following bolus administration of intravenous contrast. CONTRAST:  158mL ISOVUE-300 IOPAMIDOL (ISOVUE-300) INJECTION 61%  COMPARISON:  CT scan of June 08, 2017. FINDINGS: Lower chest: No acute abnormality. Hepatobiliary: Status post cholecystectomy. Stable left hepatic cyst is noted. There is interval development of subcapsular fluid collection overlying lateral and anterior aspect of right hepatic lobe, with several smaller subcapsular fluid collections along the inferior and medial aspect of right hepatic lobe posteriorly. These may represent small abscesses or old hematomas. Pancreas: Unremarkable. No pancreatic ductal dilatation or surrounding inflammatory changes. Spleen: Interval development of subcapsular fluid collection along the superior and lateral aspect of the spleen concerning for seroma or possibly abscess. Adrenals/Urinary Tract: Adrenal glands are unremarkable. Kidneys are normal, without renal calculi, focal lesion, or hydronephrosis. Bladder is unremarkable. Stomach/Bowel: Status post placement of ileostomy in right lower quadrant. Surgical anastomosis of distal sigmoid colon is noted ; small amount of gas is seen around this anastomosis which may be residual from prior surgery, although perforation cannot be excluded. No abnormal bowel dilatation is noted. Mild wall thickening of small bowel loops is noted anteriorly in the pelvis suggesting inflammation. Stomach is unremarkable. Vascular/Lymphatic: Aortic atherosclerosis. No enlarged abdominal or pelvic lymph nodes. Reproductive: Status post hysterectomy. No adnexal masses. Other: 2 surgical drains are noted, both with tips in the anterior portion of the pelvis. No fluid collection is noted around the tips of these drains. There is interval development of 3.9 x 3.3 cm fluid collection in left pericolic gutter concerning for abscess. 3.0 x 1.7 cm fluid collection is seen along left psoas muscle more inferiorly. Musculoskeletal:  No acute or significant osseous findings. IMPRESSION: Status post placement of ileostomy in right lower quadrant. There is no evidence of  bowel obstruction, although mild wall thickening is seen involving small bowel loops anteriorly in the pelvis consistent with inflammation. Small amount of gas is seen in the fat surrounding surgical anastomosis in distal sigmoid colon which may be residual from previous surgery, but persistent perforation cannot be excluded. Interval placement of 2 surgical drains in the pelvis, with no fluid collection around the tip of either catheter. Interval development of 3.9 x 3.3 cm fluid collection the left pericolic gutter, as well as another 3.0 x 1.7 cm fluid collection along left psoas muscle more inferiorly, concerning for small abscesses. Also noted are multiple subcapsular fluid collections involving the liver and spleen which may represent seromas or old hematomas, but abscesses cannot be excluded. Aortic atherosclerosis. Electronically Signed   By: Marijo Conception, M.D.   On: 06/16/2017 14:39    Anti-infectives: Anti-infectives    Start     Dose/Rate Route Frequency Ordered Stop   06/13/17 1030  ceFEPIme (MAXIPIME) 2 g in dextrose 5 % 50 mL IVPB     2 g 100 mL/hr over 30 Minutes Intravenous Every 8 hours 06/13/17 0929     06/09/17 0600  piperacillin-tazobactam (ZOSYN) IVPB 3.375 g  Status:  Discontinued     3.375 g 12.5 mL/hr over 240 Minutes Intravenous Every 8 hours 06/09/17 0241 06/13/17 0928   06/08/17 2015  piperacillin-tazobactam (ZOSYN) IVPB 3.375 g    Comments:  SEND TO SDS   3.375 g 100 mL/hr over 30 Minutes Intravenous  Once 06/08/17 2010 06/08/17 2200   06/08/17 1530  piperacillin-tazobactam (ZOSYN) IVPB 3.375 g     3.375 g 100 mL/hr over 30 Minutes Intravenous  Once 06/08/17 1523 06/08/17 1559      Assessment/Plan: s/p Procedure(s): EXPLORATORY LAPAROTOMY ILEOSTOMY   White blood cell count is elevated. Rebecca Obrien has multiple sources for infection including 2 drains in the pelvis which are draining purulence and open wound in the midline which is draining purulence and 2 undrained  collections which are too small for CT-guided drainage at this time. This all being found on the new CT scan. With her the need for continued IV antibiotics. As Rebecca Obrien was discharged once before and had to come back to the hospital Rebecca Obrien and I talked about staying in the hospital for right now for aggressive wound management as well as IV antibiotics.  Florene Glen, MD, FACS  06/17/2017

## 2017-06-17 NOTE — Consult Note (Signed)
[facility] Nurse ostomy follow up Stoma type/location: RLQ Ileostomy Stomal assessment/size: red rubber cath retention rod, sutured in place.  Protected with barrier ring Peristomal assessment: intact Treatment options for stomal/peristomal skin: barrier ring Output soft green stool Ostomy pouching: 2pc. Pouching system. Education provided: pouch change performed.  Patient is feeling more comfortable with self care.  Pouching teaching will need to continue at home due to complex pouching with retention rod.  Enrolled patient in [place] Start Discharge program: Yes  Domenic Moras RN BSN [facility] Pager [phone]

## 2017-06-18 MED ORDER — IBUPROFEN 400 MG PO TABS: 600.0000 mg | ORAL_TABLET | Freq: Four times a day (QID) | ORAL | Status: DC | PRN

## 2017-06-18 NOTE — Progress Notes (Signed)
Spoke with Dr.Davis about patient request for something for gas. Dr.davis stated he would come by and see patient this morning.  Rebecca Obrien

## 2017-06-18 NOTE — Progress Notes (Addendum)
[place] Hospital Day(s): 9.   Post op day(s): 10 Days Post-Op.   Interval History: Patient seen and examined, no acute events or new complaints overnight. Patient reports yesterday's mild RUQ abdominal pain has resolved, though overnight she experienced similar LUQ abdominal pain that has now also resolved. She otherwise reports her ostomy is functioning and purulent drainage into both surgically placed drains has been decreasing while drainage from wound has only been where staples were removed to focus drainage and allow the rest of her incision to heal. She denies any fever/chills, N/V, CP, or SOB, and says she is eager to return home, but wants to know she's progressing in the right direction.  Review of Systems:  Constitutional: denies fever, chills  HEENT: denies cough or congestion  Respiratory: denies any shortness of breath  Cardiovascular: denies chest pain or palpitations  Gastrointestinal: abdominal pain, N/V, and bowel function as per interval history Genitourinary: denies burning with urination or urinary frequency Musculoskeletal: denies pain, decreased motor or sensation Integumentary: denies any other rashes or skin discolorations except post-surgical abdominal wounds Neurological: denies HA or vision/hearing changes   Vital signs in last 24 hours: [min-max] current  Temp:  [98.1 F (36.7 C)-98.7 F (37.1 C)] 98.1 F (36.7 C) (10/13 0538) Pulse Rate:  [90-94] 93 (10/13 0538) Resp:  [18-20] 18 (10/13 0538) BP: (96-118)/(58-70) 96/59 (10/13 0538) SpO2:  [94 %-97 %] 94 % (10/13 0538)     Height: 5\' 5"  (165.1 cm) Weight: 176 lb 14.4 oz (80.2 kg) BMI (Calculated): 29.44   Intake/Output this shift:  Total I/O In: -  Out: 425 [Urine:100; Stool:325]   Intake/Output last 2 shifts:  @IOLAST2SHIFTS @   Physical Exam:  Constitutional: alert, cooperative and no distress  HENT: normocephalic without obvious abnormality  Eyes: PERRL, EOM's grossly intact  and symmetric  Neuro: CN II - XII grossly intact and symmetric without deficit  Respiratory: breathing non-labored at rest  Cardiovascular: regular rate and sinus rhythm  Gastrointestinal: soft, non-tender, and non-distended with purulent fluid draining from R > L drains and from mid-vertical midline laparotomy incision where staples were removed without surrounding erythema, ileostomy with appropriate enteric contents in ostomy bag Musculoskeletal: UE and LE FROM, no edema or wounds, motor and sensation grossly intact, NT  Labs:  CBC Latest Ref Rng & Units 06/17/2017 06/12/2017 06/11/2017  WBC 3.6 - 11.0 K/uL 14.1(H) 8.8 7.9  Hemoglobin 12.0 - 16.0 g/dL 9.3(L) 9.3(L) 8.6(L)  Hematocrit 35.0 - 47.0 % 27.1(L) 26.6(L) 25.2(L)  Platelets 150 - 440 K/uL 537(H) 347 305   CMP Latest Ref Rng & Units 06/17/2017 06/15/2017 06/11/2017  Glucose 65 - 99 mg/dL 111(H) - 110(H)  BUN 6 - 20 mg/dL 9 - 20  Creatinine 0.44 - 1.00 mg/dL 0.60 0.81 0.75  Sodium 135 - 145 mmol/L 135 - 141  Potassium 3.5 - 5.1 mmol/L 3.1(L) - 4.0  Chloride 101 - 111 mmol/L 98(L) - 108  CO2 22 - 32 mmol/L 27 - 26  Calcium 8.9 - 10.3 mg/dL 7.6(L) - 7.5(L)  Total Protein 6.5 - 8.1 g/dL - - -  Total Bilirubin 0.3 - 1.2 mg/dL - - -  Alkaline Phos 38 - 126 U/L - - -  AST 15 - 41 U/L - - -  ALT 14 - 54 U/L - - -   Imaging studies: No new pertinent imaging studies   Assessment/Plan:(ICD-10's: K91.89) 51 y.o.femalewith well-drained intra-abdominal abscess 10 Days Post-Ops/p exploratory laparotomy with washout of intra-abdominal stool/abscess, placement of intra-abdominal  drains x 2, and creation of diverting ileostomyfor post-op anastomotic disruption following laparoscopic sigmoid colectomy for subacute diverticulitis with former pelvic abscess, complicated by pertinent comorbidities including chronic rheumatoid arthritis, HTN, HLD, hyperthyroidism, melanoma, and history of stroke.  - pain control prn, minimize  narcotics - BID dry gauze packing and dressing + prn as discussed with RN and patient - switched 10/8 from Zosyn to Cefepime per culture sensitivities, though WBC since up - follow-up cultures and sensitivities of pus from Right drain (pending) - initial culture was of intra-abdominal feculopurulent fluid             - discharge planning with drains when antibiotics more certain (anticipate PICC) - medical management of medical comorbidities (home medications) - DVT prophylaxis, ambulation encouraged  All of the above findings and recommendations were discussed with the patient and patient's RN, and all of patient's questions were answered to her expressed satisfaction.  -- Marilynne Drivers Rosana Hoes, MD, [place]: [facility] - Partnering for exceptional care. Office: [phone]

## 2017-06-19 LAB — CBC
HCT: 27.2 % — ABNORMAL LOW (ref 35.0–47.0)
HEMOGLOBIN: 9.1 g/dL — AB (ref 12.0–16.0)
MCH: 26.9 pg (ref 26.0–34.0)
MCHC: 33.4 g/dL (ref 32.0–36.0)
MCV: 80.6 fL (ref 80.0–100.0)
Platelets: 557 10*3/uL — ABNORMAL HIGH (ref 150–440)
RBC: 3.38 MIL/uL — ABNORMAL LOW (ref 3.80–5.20)
RDW: 15 % — ABNORMAL HIGH (ref 11.5–14.5)
WBC: 13.8 10*3/uL — ABNORMAL HIGH (ref 3.6–11.0)

## 2017-06-19 NOTE — Progress Notes (Signed)
[facility] Hospital Day(s): 10.   Post op day(s): 11 Days Post-Op.   Interval History: Patient seen and examined, no acute events or new complaints overnight. Patient reports ambulating and tolerating diet (though would like non-hospital food), complains only of back pain when in bed, denies abdominal pain, N/V, fever/chills, CP, or SOB.  Review of Systems:  Constitutional: denies fever, chills  HEENT: denies cough or congestion  Respiratory: denies any shortness of breath  Cardiovascular: denies chest pain or palpitations  Gastrointestinal: abdominal pain, N/V, and bowel function as per interval history Genitourinary: denies burning with urination or urinary frequency Musculoskeletal: denies pain, decreased motor or sensation Integumentary: denies any other rashes or skin discolorations except post-surgical abdominal wounds Neurological: denies HA or vision/hearing changes   Vital signs in last 24 hours: [min-max] current  Temp:  [98 F (36.7 C)-98.6 F (37 C)] 98.6 F (37 C) (10/14 0445) Pulse Rate:  [83-86] 83 (10/14 0445) Resp:  [16-20] 16 (10/14 0445) BP: (90-110)/(59-63) 100/63 (10/14 0445) SpO2:  [96 %-97 %] 96 % (10/14 0445)     Height: 5\' 5"  (165.1 cm) Weight: 176 lb 14.4 oz (80.2 kg) BMI (Calculated): 29.44   Intake/Output this shift:  Total I/O In: 26 [IV Piggyback:26] Out: 750 [Urine:150; Stool:600]   Intake/Output last 2 shifts:  @IOLAST2SHIFTS @   Physical Exam:  Constitutional: alert, cooperative and no distress, sitting in chair HENT: normocephalic without obvious abnormality  Eyes: PERRL, EOM's grossly intact and symmetric  Neuro: CN II - XII grossly intact and symmetric without deficit  Respiratory: breathing non-labored at rest  Cardiovascular: regular rate and sinus rhythm  Gastrointestinal: soft, non-tender, and non-distended with purulent fluid draining from R >L drains and from mid-vertical midline laparotomy incision where staples  were removedwithout surrounding erythema, ileostomy with appropriate enteric contents in ostomy bag Musculoskeletal: UE and LE FROM, no edema or wounds, motor and sensation grossly intact, NT   Labs:  CBC Latest Ref Rng & Units 06/19/2017 06/17/2017 06/12/2017  WBC 3.6 - 11.0 K/uL 13.8(H) 14.1(H) 8.8  Hemoglobin 12.0 - 16.0 g/dL 9.1(L) 9.3(L) 9.3(L)  Hematocrit 35.0 - 47.0 % 27.2(L) 27.1(L) 26.6(L)  Platelets 150 - 440 K/uL 557(H) 537(H) 347   CMP Latest Ref Rng & Units 06/17/2017 06/15/2017 06/11/2017  Glucose 65 - 99 mg/dL 111(H) - 110(H)  BUN 6 - 20 mg/dL 9 - 20  Creatinine 0.44 - 1.00 mg/dL 0.60 0.81 0.75  Sodium 135 - 145 mmol/L 135 - 141  Potassium 3.5 - 5.1 mmol/L 3.1(L) - 4.0  Chloride 101 - 111 mmol/L 98(L) - 108  CO2 22 - 32 mmol/L 27 - 26  Calcium 8.9 - 10.3 mg/dL 7.6(L) - 7.5(L)  Total Protein 6.5 - 8.1 g/dL - - -  Total Bilirubin 0.3 - 1.2 mg/dL - - -  Alkaline Phos 38 - 126 U/L - - -  AST 15 - 41 U/L - - -  ALT 14 - 54 U/L - - -   Imaging studies: No new pertinent imaging studies   Assessment/Plan:(ICD-10's: K91.89) 51 y.o.femalewith well-drained pelvic abscess 11Days Post-Ops/p exploratory laparotomy with washout of intra-abdominal stool/abscess, placement of intra-abdominal drains x 2, and creation of diverting ileostomyfor post-op anastomotic disruption following laparoscopic sigmoid colectomy for subacute diverticulitis with additional small intra-abdominal abscesses x 2 and stable leukocytosis, complicated by comorbidities including chronic rheumatoid arthritis, HTN, HLD, hyperthyroidism, melanoma, and history of stroke.  - pain control prn, minimize narcotics - BIDdry gauze packing and dressing+ prn asdiscussed with RN and  patient - switched 10/8 from Zosyn to [place] culture sensitivities, though WBC since up - follow-up cultures and sensitivities of pus from Right drain (pending) - initial culture  was of intra-abdominal feculopurulent fluid - discharge planning with drains when antibiotics more certain (anticipate PICC) - medical management of medical comorbidities (home medications) - DVT prophylaxis, ambulation encouraged  All of the above findings and recommendations were discussed with the patient and patient's RN, and all of patient's questions were answered to her expressed satisfaction.  -- Marilynne Drivers Rosana Hoes, MD, [place]: [facility] - Partnering for exceptional care. Office: [phone]

## 2017-06-20 ENCOUNTER — Telehealth: Payer: Self-pay

## 2017-06-20 LAB — CBC
HCT: 27.4 % — ABNORMAL LOW (ref 35.0–47.0)
Hemoglobin: 9.2 g/dL — ABNORMAL LOW (ref 12.0–16.0)
MCH: 27 pg (ref 26.0–34.0)
MCHC: 33.6 g/dL (ref 32.0–36.0)
MCV: 80.5 fL (ref 80.0–100.0)
PLATELETS: 594 10*3/uL — AB (ref 150–440)
RBC: 3.4 MIL/uL — ABNORMAL LOW (ref 3.80–5.20)
RDW: 15 % — AB (ref 11.5–14.5)
WBC: 12.5 10*3/uL — AB (ref 3.6–11.0)

## 2017-06-20 MED ORDER — METRONIDAZOLE 500 MG PO TABS: 500.0000 mg | ORAL_TABLET | Freq: Three times a day (TID) | ORAL | 0 refills | Status: AC

## 2017-06-20 MED ORDER — METRONIDAZOLE IN NACL 5-0.79 MG/ML-% IV SOLN
500.0000 mg | Freq: Three times a day (TID) | INTRAVENOUS | Status: DC
  Administered 2017-06-20: 500 mg via INTRAVENOUS
  Filled 2017-06-20 (×4): qty 100

## 2017-06-20 MED ORDER — HYDROCODONE-ACETAMINOPHEN 5-325 MG PO TABS: 1.0000 | ORAL_TABLET | Freq: Four times a day (QID) | ORAL | 0 refills | Status: DC | PRN

## 2017-06-20 MED ORDER — CIPROFLOXACIN HCL 500 MG PO TABS: 500.0000 mg | ORAL_TABLET | Freq: Two times a day (BID) | ORAL | 0 refills | Status: AC

## 2017-06-20 NOTE — Progress Notes (Signed)
Nutrition Follow Up Note   DOCUMENTATION CODES:   Not applicable  INTERVENTION:   Discontinue Ensure Enlive as pt is not drinking this   NUTRITION DIAGNOSIS:   Inadequate oral intake related to acute illness, nausea, poor appetite as evidenced by meal completion < 25%. improved  GOAL:   Patient will meet greater than or equal to 90% of their needs- progressing  MONITOR:   PO intake, Supplement acceptance, Labs, Weight trends, I & O's  ASSESSMENT:   51 y.o. female with well-drained intra-abdominal abscess s/p exploratory laparotomy with washout of intra-abdominal stool/abscess, placement of intra-abdominal drains x 2, and creation of diverting ileostomy for post-op anastomotic disruption following laparoscopic sigmoid colectomy for subacute diverticulitis with former pelvic abscess, complicated by pertinent comorbidities including chronic rheumatoid arthritis, HTN, HLD, hyperthyroidism, melanoma, and history of stroke.   Pt doing well; eating most of her meals although she prefers to not eat hospital food. Passing stool. Pt is not drinking the Ensure now that her oral intake has improved; RD will discontinue. No new weight since 10/9. Spoke to RN, pt to discharge today.   Medications reviewed and include: lovenox, synthroid, cefepime, metronidazole  Labs reviewed: K 3.1(L), Cl 98(L), Ca 7.6(L) Wbc- 12.5(H), Hgb 9.2(L), Hct 27.4(L)  Diet Order:  Diet regular Room service appropriate? Yes; Fluid consistency: Thin Diet - low sodium heart healthy  Skin:  Wound (see comment) (incision abdomen )  Last BM:  10-14- 341ml- ileostomy   Height:   Ht Readings from Last 1 Encounters:  06/08/17 5\' 5"  (1.651 m)    Weight:   Wt Readings from Last 1 Encounters:  06/14/17 176 lb 14.4 oz (80.2 kg)    Ideal Body Weight:  56.8 kg  BMI:  Body mass index is 29.44 kg/m.  Estimated Nutritional Needs:   Kcal:  1600-1900kcal/day   Protein:  75-90g/day   Fluid:  >1.6L/day    EDUCATION NEEDS:   Education needs addressed  Rebecca Distance MS, RD, LDN Pager #[phone] After Hours Pager: [phone]

## 2017-06-20 NOTE — Consult Note (Signed)
[facility] Nurse ostomy follow up Stoma type/location: RLQ Ileostomy with retention rod.  Surgeon at bedside and trimmed red rubber catheter a bit to improve fit.  Stomal assessment/size: Os is oval 1 1/4" top to bottom and 1 3/4" side to side.  Template is sent home with patient.  Discharge today Peristomal assessment: pink, tender peristomal skin.  Retention rod makes it impossible to get an air tight seal.  Stoma powder and barrier ring used today.  Explained rationale to patient.   Treatment options for stomal/peristomal skin: stoma powder and barrier ring Output liquid brown stool Ostomy pouching: 2pc. 2 3/4" pouch with barrier ring to add convexity due to nearly flush stoma and retention rod sutured in place.   Education provided: POuch change performed by patient.  She is ready to do home.  Understands that she will have [facility] assistance.  Is enrolled in secure start and says her package is there, per her family.  Enrolled patient in [place] Start Discharge program: Yes [place] team will follow. Discharging today and contact information provided.  Domenic Moras RN BSN [place] Pager [phone]

## 2017-06-20 NOTE — Telephone Encounter (Signed)
[employer] requested patient's office visit's, operative reports and images. They have been faxed.

## 2017-06-20 NOTE — Care Management (Signed)
Home health orders in.  Signing off.

## 2017-06-20 NOTE — Care Management (Signed)
Patient to discharge home today with home health RN for ostomy management and wound care.  Sarah with [facility] notified of discharge.  Awaiting MD order for home health.

## 2017-06-20 NOTE — Discharge Summary (Signed)
Patient ID: Rebecca Obrien MRN: [id number] DOB/AGE: 1966/08/17 51 y.o.  Admit date: 06/08/2017 Discharge date: 06/20/2017   Discharge Diagnoses:  Active Problems:   Large intestine anastomotic leak   Procedures: loop ileostomy w drain palcement  Hospital Course: 51 yo female s/p sigmoid colectomy by Dr. Adonis Huguenin  9/ 25 readmitted for abdominal pain and w/u revealing obvious anastomotic leak. She was promptly taking back to the OR for a washout loop ileostomy and drain placement,. She was palced on A/Bs and did develop another collection that was aspirated by IR. She continue to improve with medical therapy. At the time of DC she was ambulating, tolerating diet, afebrile and her ostomy was functional PE NAD, abd: soft, ileostomy patent. I did trim the edge of  the red rubber to be able to have a better fit for the device. Midline w some opening but w clear base. Drain w serous output. Condition at the time of DC is stable. She will complete an additional week of A/bs and will go home w drain and wound care.    Disposition: 01-Home or Self Care  Discharge Instructions    Call MD for:  difficulty breathing, headache or visual disturbances    Complete by:  As directed    Call MD for:  extreme fatigue    Complete by:  As directed    Call MD for:  hives    Complete by:  As directed    Call MD for:  persistant dizziness or light-headedness    Complete by:  As directed    Call MD for:  persistant nausea and vomiting    Complete by:  As directed    Call MD for:  redness, tenderness, or signs of infection (pain, swelling, redness, odor or green/yellow discharge around incision site)    Complete by:  As directed    Call MD for:  severe uncontrolled pain    Complete by:  As directed    Call MD for:  temperature >100.4    Complete by:  As directed    Diet - low sodium heart healthy    Complete by:  As directed    Discharge instructions    Complete by:  As directed    Please teach pt  about JP care   Increase activity slowly    Complete by:  As directed    Lifting restrictions    Complete by:  As directed    20 lbs x 6 wks   No dressing needed    Complete by:  As directed      Allergies as of 06/20/2017      Reactions   Adhesive [tape]    Skin breakdown/dermatitis with prolonged use    Latex Dermatitis   NOT LATEX ALLERGIC.  Patient has problem with adhesive, not latex      Medication List    TAKE these medications   acetaminophen 650 MG CR tablet Commonly known as:  TYLENOL Take 1,300 mg by mouth every 8 (eight) hours as needed for pain.   ciprofloxacin 500 MG tablet Commonly known as:  CIPRO Take 1 tablet (500 mg total) by mouth 2 (two) times daily.   hydrochlorothiazide 12.5 MG capsule Commonly known as:  MICROZIDE Take 12.5 mg by mouth daily.   HYDROcodone-acetaminophen 5-325 MG tablet Commonly known as:  NORCO/VICODIN Take 1-2 tablets by mouth every 6 (six) hours as needed for moderate pain.   leflunomide 20 MG tablet Commonly known as:  ARAVA Take 20 mg  by mouth daily.   levothyroxine 112 MCG tablet Commonly known as:  SYNTHROID, LEVOTHROID Take 112 mcg by mouth daily before breakfast.   lisinopril 20 MG tablet Commonly known as:  PRINIVIL,ZESTRIL Take 20 mg by mouth daily.   meloxicam 15 MG tablet Commonly known as:  MOBIC Take 15 mg by mouth daily.   metroNIDAZOLE 500 MG tablet Commonly known as:  FLAGYL Take 1 tablet (500 mg total) by mouth 3 (three) times daily.   oxyCODONE-acetaminophen 5-325 MG tablet Commonly known as:  PERCOCET/ROXICET Take 1-2 tablets by mouth every 4 (four) hours as needed for moderate pain.   potassium chloride 10 MEQ tablet Commonly known as:  K-DUR Take 2 tablets by mouth 2 (two) times daily with a meal.   XELJANZ XR 11 MG Tb24 Generic drug:  Tofacitinib Citrate Take 1 tablet by mouth daily.      Follow-up Information    Clayburn Pert, MD Follow up in 1 week(s).   Specialty:  General  Surgery Contact information: [address] [place] [address] [phone]            Caroleen Hamman, MD FACS

## 2017-06-21 ENCOUNTER — Other Ambulatory Visit: Payer: Self-pay | Admitting: *Deleted

## 2017-06-21 DIAGNOSIS — Z4803 Encounter for change or removal of drains: Secondary | ICD-10-CM | POA: Diagnosis not present

## 2017-06-21 DIAGNOSIS — M0579 Rheumatoid arthritis with rheumatoid factor of multiple sites without organ or systems involvement: Secondary | ICD-10-CM | POA: Diagnosis not present

## 2017-06-21 DIAGNOSIS — Z432 Encounter for attention to ileostomy: Secondary | ICD-10-CM | POA: Diagnosis not present

## 2017-06-21 DIAGNOSIS — M545 Low back pain: Secondary | ICD-10-CM | POA: Diagnosis not present

## 2017-06-21 DIAGNOSIS — Z79891 Long term (current) use of opiate analgesic: Secondary | ICD-10-CM | POA: Diagnosis not present

## 2017-06-21 DIAGNOSIS — Z8673 Personal history of transient ischemic attack (TIA), and cerebral infarction without residual deficits: Secondary | ICD-10-CM | POA: Diagnosis not present

## 2017-06-21 DIAGNOSIS — F17210 Nicotine dependence, cigarettes, uncomplicated: Secondary | ICD-10-CM | POA: Diagnosis not present

## 2017-06-21 DIAGNOSIS — T8132XD Disruption of internal operation (surgical) wound, not elsewhere classified, subsequent encounter: Secondary | ICD-10-CM | POA: Diagnosis not present

## 2017-06-21 LAB — AEROBIC/ANAEROBIC CULTURE (SURGICAL/DEEP WOUND)

## 2017-06-21 LAB — AEROBIC/ANAEROBIC CULTURE W GRAM STAIN (SURGICAL/DEEP WOUND)

## 2017-06-21 NOTE — Patient Outreach (Signed)
[facility]) Care Management  06/21/2017  Rebecca Obrien 1966/08/15 [id number]   Subjective: Telephone call to patient's home / mobile number, spoke with patient, and HIPAA verified.  Discussed [facility] Care Management [facility]MR Transition of care follow up, patient voiced understanding, and is in agreement to follow up.   Patient states she remembers speaking with this RNCM in the past, today is not a good day to talk, ostomy currently leaking, waiting for home health RN to call, and bring her the right supplies for her ostomy.  She request call back tomorrow and is appreciative of the follow up.   Objective: Per chart review, Patient hospitalized 06/08/17 - 06/20/17 for Large intestine anastomotic leak.  Status post  Exploratory laparotomy, washout of stool/abscess, Placement of pelvic 29F Blake drains x 2,  Creation of diverting loop ileostomy on 06/09/17.  Patient hospitalized 05/2517 -05/2817 for persistent diverticulitis. Status post Laparoscopic sigmoid colectomy with immediate anastomosis, mobilization of splenic flexure on 05/31/17. Patient hospitalized 05/03/17 - 05/07/17 for Diverticulitis of intestine with abscess. Patient hospitalized 04/27/17 -04/30/17 for Diverticulitis with microperforation. Patient also has a history of Rheumatoid arthritis, hypertension, hyperlipidemia, skin melanomaand stroke.    Assessment:Received UMR Transition of care referral on 05/27/17. Transition of care follow up pending patient contact.    Plan: RNCM will call patient for 2nd telephone outreach attempt, transition of care follow up, within 10 business days if no return call.       Dewan Emond H. [name], BSN, [place] Management [facility] Telephonic CM Phone: [phone] Fax: [phone]

## 2017-06-22 ENCOUNTER — Encounter: Payer: Self-pay | Admitting: Emergency Medicine

## 2017-06-22 ENCOUNTER — Other Ambulatory Visit: Payer: Self-pay

## 2017-06-22 ENCOUNTER — Emergency Department
Admission: EM | Admit: 2017-06-22 | Discharge: 2017-06-22 | Disposition: A | Discharge status: Home / Self Care | Payer: 59 | Attending: Emergency Medicine | Admitting: Emergency Medicine

## 2017-06-22 ENCOUNTER — Ambulatory Visit: Payer: Self-pay | Admitting: *Deleted

## 2017-06-22 DIAGNOSIS — M545 Low back pain: Secondary | ICD-10-CM | POA: Diagnosis not present

## 2017-06-22 DIAGNOSIS — F17210 Nicotine dependence, cigarettes, uncomplicated: Secondary | ICD-10-CM | POA: Insufficient documentation

## 2017-06-22 DIAGNOSIS — Z91040 Latex allergy status: Secondary | ICD-10-CM | POA: Diagnosis not present

## 2017-06-22 DIAGNOSIS — I10 Essential (primary) hypertension: Secondary | ICD-10-CM | POA: Diagnosis not present

## 2017-06-22 DIAGNOSIS — E039 Hypothyroidism, unspecified: Secondary | ICD-10-CM | POA: Insufficient documentation

## 2017-06-22 DIAGNOSIS — E860 Dehydration: Secondary | ICD-10-CM | POA: Diagnosis not present

## 2017-06-22 DIAGNOSIS — Z432 Encounter for attention to ileostomy: Secondary | ICD-10-CM | POA: Diagnosis not present

## 2017-06-22 DIAGNOSIS — T8132XD Disruption of internal operation (surgical) wound, not elsewhere classified, subsequent encounter: Secondary | ICD-10-CM | POA: Diagnosis not present

## 2017-06-22 DIAGNOSIS — G8918 Other acute postprocedural pain: Secondary | ICD-10-CM | POA: Diagnosis not present

## 2017-06-22 DIAGNOSIS — Z8673 Personal history of transient ischemic attack (TIA), and cerebral infarction without residual deficits: Secondary | ICD-10-CM | POA: Insufficient documentation

## 2017-06-22 DIAGNOSIS — K9419 Other complications of enterostomy: Secondary | ICD-10-CM | POA: Insufficient documentation

## 2017-06-22 DIAGNOSIS — Z79899 Other long term (current) drug therapy: Secondary | ICD-10-CM | POA: Diagnosis not present

## 2017-06-22 DIAGNOSIS — M0579 Rheumatoid arthritis with rheumatoid factor of multiple sites without organ or systems involvement: Secondary | ICD-10-CM | POA: Diagnosis not present

## 2017-06-22 DIAGNOSIS — Z791 Long term (current) use of non-steroidal anti-inflammatories (NSAID): Secondary | ICD-10-CM | POA: Diagnosis not present

## 2017-06-22 DIAGNOSIS — Z4803 Encounter for change or removal of drains: Secondary | ICD-10-CM | POA: Diagnosis not present

## 2017-06-22 DIAGNOSIS — Z79891 Long term (current) use of opiate analgesic: Secondary | ICD-10-CM | POA: Diagnosis not present

## 2017-06-22 DIAGNOSIS — K9409 Other complications of colostomy: Secondary | ICD-10-CM | POA: Diagnosis not present

## 2017-06-22 LAB — CBC WITH DIFFERENTIAL/PLATELET
BASOS ABS: 0.1 10*3/uL (ref 0–0.1)
BASOS PCT: 1 %
EOS ABS: 0 10*3/uL (ref 0–0.7)
Eosinophils Relative: 0 %
HCT: 31.8 % — ABNORMAL LOW (ref 35.0–47.0)
Hemoglobin: 10.6 g/dL — ABNORMAL LOW (ref 12.0–16.0)
Lymphocytes Relative: 12 %
Lymphs Abs: 1.5 10*3/uL (ref 1.0–3.6)
MCH: 26.5 pg (ref 26.0–34.0)
MCHC: 33.4 g/dL (ref 32.0–36.0)
MCV: 79.3 fL — ABNORMAL LOW (ref 80.0–100.0)
MONO ABS: 0.9 10*3/uL (ref 0.2–0.9)
MONOS PCT: 7 %
NEUTROS ABS: 9.9 10*3/uL — AB (ref 1.4–6.5)
Neutrophils Relative %: 80 %
PLATELETS: 931 10*3/uL — AB (ref 150–440)
RBC: 4 MIL/uL (ref 3.80–5.20)
RDW: 14.8 % — AB (ref 11.5–14.5)
WBC: 12.5 10*3/uL — ABNORMAL HIGH (ref 3.6–11.0)

## 2017-06-22 LAB — COMPREHENSIVE METABOLIC PANEL
ALK PHOS: 126 U/L (ref 38–126)
ALT: 12 U/L — ABNORMAL LOW (ref 14–54)
ANION GAP: 14 (ref 5–15)
AST: 24 U/L (ref 15–41)
Albumin: 3.2 g/dL — ABNORMAL LOW (ref 3.5–5.0)
BILIRUBIN TOTAL: 0.8 mg/dL (ref 0.3–1.2)
BUN: 44 mg/dL — ABNORMAL HIGH (ref 6–20)
CALCIUM: 8.8 mg/dL — AB (ref 8.9–10.3)
CO2: 23 mmol/L (ref 22–32)
CREATININE: 0.97 mg/dL (ref 0.44–1.00)
Chloride: 92 mmol/L — ABNORMAL LOW (ref 101–111)
GFR calc non Af Amer: >60 mL/min (ref >60)
GLUCOSE: 123 mg/dL — AB (ref 65–99)
Potassium: 4.5 mmol/L (ref 3.5–5.1)
SODIUM: 129 mmol/L — AB (ref 135–145)
TOTAL PROTEIN: 8.8 g/dL — AB (ref 6.5–8.1)

## 2017-06-22 MED ORDER — SODIUM CHLORIDE 0.9 % IV BOLUS (SEPSIS)
1000.0000 mL | Freq: Once | INTRAVENOUS | Status: AC
  Administered 2017-06-22: 1000 mL via INTRAVENOUS

## 2017-06-22 NOTE — ED Triage Notes (Signed)
Pt arrived via [place] with son, had surgery for diverticulitis on 9/25, had surgery again on 10/3. Pt has multiple incisions colostomy. Pt states ostomy bag is leaking and the skin around the stoma there is skin breakdown present. Pt states she was sent home with the wrong bag and the stool is running down her leg. Pt supposed to meet with [facility] today at 1pm but unable to wait for appointment.

## 2017-06-22 NOTE — ED Notes (Signed)
Pt taken to cancer center to meet with wound care nurse. This is an outpatient meeting but RN requested pt remain in the system so she could chart interventions. PT no longer in the ED and has been discharged. Pt in NAD at time of discharge nd pts mother was with her in cancer center with Juliann Pulse, [place]

## 2017-06-22 NOTE — ED Notes (Signed)
This RN changed ostomy again due to leaking. Bandage is not

## 2017-06-22 NOTE — Consult Note (Signed)
[place] Nurse ostomy consult note Stoma type/location: RLQ Ileostomy  Retention rod removed.  Stomal assessment/size:  Oval stoma:  3 cm wide and 0.5 cm length.  Os at 9 o'clock with creasing noted at 3 and 9 o'clock.  This is contributing to leakage.  Will switch to one piece convex and add an ostomy belt.  Peristomal assessment: Skin is denuded from 3 o'clock to 9 o'clock, pink and moist.  Denuded skin in perineal area from leakage and moisture.  Patient is tearful and states she has been unable to sleep.  Treatment options for stomal/peristomal skin: Stoma powder, no sting skin prep and convex pouching system.  The 1 piece system home health ordered did not have belt loops.  Output liquid yellow stool. Patient given 1 L IV bolus in ED.  Encouraged to drink liquids and remain hydrated.  Ostomy pouching: 1pc.convex with barrier ring and ostomy belt Education provided: Patient and mother are shown how to size the barrier and apply barrier ring.  Pattern is sent home and they understand to continue to assess stomal size.  Belt is applied and patient feels much more secure in this system.  Sent her home with 3 additional bags.  Enrolled patient in [place] program: Yes  Supplies have been mailed.  [place] team will follow. Patient has my contact info.  Domenic Moras RN BSN [place] Pager [phone]

## 2017-06-22 NOTE — ED Provider Notes (Addendum)
[facility] Emergency Department Provider Note  ____________________________________________  Time seen: Approximately 8:42 AM  I have reviewed the triage vital signs and the nursing notes.   HISTORY  Chief Complaint Post-op Problem    HPI Rebecca Obrien is a 51 y.o. female who complains of skin irritation around her ostomy due to leaking. She has a recent history of diverticulitis, complicated by perforation and anastomotic leak. After recent surgery for washout rain placement and diverting loop ileostomy, patient reports that her pain is improving and she feels better, but she hasn't been able to get an adequate fit with the ostomy bag and is having stool leakage. She reports this is caused irritation and redness and pain of her skin on the abdominal wall as well as around the right lower quadrant drain exit through the skin. Denies fever chills or sweats. No aggravating or alleviating factors. Moderate burning pain of the skin.   mild to moderate residual abdominal pain after surgery which she is overall not concerned about. denies change in drain output or ostomy output    Past Medical History:  Diagnosis Date  . Arthritis    RA  . Cancer ([place])    skin melanoma  . Collagen vascular disease (HCC)    RA  . Difficult airway   . 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 Diverticulitis of sigmoid colon   . History of kidney stones   . Hyperlipidemia   . Hypertension   . Hyperthyroidism   . Hypokalemia   . Obesity   . Rheumatoid arthritis(714.0)   . Stroke [facility]) 2013   no residual     Patient Active Problem List   Diagnosis Date Noted  . Large intestine anastomotic leak 06/09/2017  . Difficult intubation   . Diverticulitis of intestine with abscess 05/03/2017  . Tobacco abuse 05/02/2017  . Hypokalemia   . Hyperthyroidism   . Difficult airway   . Diverticulitis of large intestine with abscess   .  Diverticulitis 04/27/2017  . Hypocalcemia 03/02/2017  . 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 S/P total thyroidectomy 01/28/2017  . 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 Abnormal TSH 08/04/2016  . Encounter for antineoplastic chemotherapy and immunotherapy 07/13/2016  . Encounter for long-term (current) use of high-risk medication 06/23/2016  . 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 Malignant melanoma of lower leg, left ([place]) 12/03/2015  . History of stroke 12/12/2013  . Rheumatoid arthritis involving multiple sites with positive rheumatoid factor ([place]) 12/12/2013  . CVA (cerebral infarction) 07/23/2012  . Hypertension 07/23/2012     Past Surgical History:  Procedure Laterality Date  . ABDOMINAL HYSTERECTOMY    . CARPAL TUNNEL RELEASE  Bilateral  . CHOLECYSTECTOMY    . CYSTOSCOPY WITH STENT PLACEMENT Bilateral 05/31/2017   Procedure: CYSTOSCOPY WITH STENT PLACEMENT- LIGHTED STENTS;  Surgeon: Hollice Espy, MD;  Location: [facility] ORS;  Service: Urology;  Laterality: Bilateral;  . ILEOSTOMY N/A 06/08/2017   Procedure: ILEOSTOMY;  Surgeon: Vickie Epley, MD;  Location: [place];  Service: General;  Laterality: N/A;  . LAPAROSCOPIC SIGMOID COLECTOMY N/A 05/31/2017   Procedure: LAPAROSCOPIC SIGMOID COLECTOMY;  Surgeon: Clayburn Pert, MD;  Location: [facility] ORS;  Service: General;  Laterality: N/A;  . LAPAROTOMY N/A 06/08/2017   Procedure: EXPLORATORY LAPAROTOMY;  Surgeon: Vickie Epley, MD;  Location: [facility] ORS;  Service: General;  Laterality: N/A;  . TEE WITHOUT CARDIOVERSION  07/26/2012   Procedure: TRANSESOPHAGEAL ECHOCARDIOGRAM (TEE);  Surgeon: Thayer Headings, MD;  Location: [place];  Service: Cardiovascular;  Laterality: N/A;  . TONSILLECTOMY    . TOTAL THYROIDECTOMY       Prior to Admission medications   Medication Sig Start Date End Date Taking? Authorizing Provider  ciprofloxacin (CIPRO) 500 MG tablet Take 1 tablet (500 mg total) by  mouth 2 (two) times daily. 06/20/17 06/27/17 Yes Pabon, Diego F, MD  hydrochlorothiazide (MICROZIDE) 12.5 MG capsule Take 12.5 mg by mouth daily.  04/20/17  Yes [provider]  HYDROcodone-acetaminophen (NORCO/VICODIN) 5-325 MG tablet Take 1-2 tablets by mouth every 6 (six) hours as needed for moderate pain. 06/20/17  Yes Pabon, [place], MD  leflunomide (ARAVA) 20 MG tablet Take 20 mg by mouth daily.  04/08/17 10/05/17 Yes [provider]  levothyroxine (SYNTHROID, LEVOTHROID) 112 MCG tablet Take 112 mcg by mouth daily before breakfast.   Yes [provider]  lisinopril (PRINIVIL,ZESTRIL) 20 MG tablet Take 20 mg by mouth daily.   Yes [provider]  meloxicam (MOBIC) 15 MG tablet Take 15 mg by mouth daily.  02/04/17  Yes [provider]  metroNIDAZOLE (FLAGYL) 500 MG tablet Take 1 tablet (500 mg total) by mouth 3 (three) times daily. 06/20/17 06/27/17 Yes Pabon, Diego F, MD  potassium chloride (K-DUR) 10 MEQ tablet Take 2 tablets by mouth 2 (two) times daily with a meal.  03/02/17 03/02/18 Yes [provider]  Tofacitinib Citrate (XELJANZ XR) 11 MG TB24 Take 1 tablet by mouth daily.   Yes [provider]  acetaminophen (TYLENOL) 650 MG CR tablet Take 1,300 mg by mouth every 8 (eight) hours as needed for pain.    [provider]  oxyCODONE-acetaminophen (PERCOCET/ROXICET) 5-325 MG tablet Take 1-2 tablets by mouth every 4 (four) hours as needed for moderate pain. 06/03/17   Clayburn Pert, MD     Allergies Adhesive [tape] and Latex   Family History  Problem Relation Age of Onset  . Nephrolithiasis Mother   . Hypertension Mother   . Hypertension Father   . Heart failure Father   . Cardiomyopathy Brother     Social History Social History  Substance Use Topics  . Smoking status: Current Every Day Smoker    Packs/day: 0.25    Years: 23.00    Types: Cigarettes    Last attempt to quit: 07/23/2012  . Smokeless tobacco:  Never Used  . Alcohol use No    Review of Systems  Constitutional:   No fever or chills.  ENT:   No sore throat. No rhinorrhea. Cardiovascular:   No chest pain or syncope. Respiratory:   No dyspnea or cough. Gastrointestinal:   positive abdominal pain as above. No vomiting. Liquid stool in her ostomy.  Musculoskeletal:   Negative for focal pain or swelling All other systems reviewed and are negative except as documented above in ROS and HPI.  ____________________________________________   PHYSICAL EXAM:  VITAL SIGNS: ED Triage Vitals  Enc Vitals Group     BP 06/22/17 0711 116/88     Pulse Rate 06/22/17 0711 (!) 110     Resp 06/22/17 0711 18     Temp 06/22/17 0711 (!) 97.5 F (36.4 C)     Temp Source 06/22/17 0711 Oral     SpO2 06/22/17 0711 97 %     Weight --      Height --      Head Circumference --      Peak Flow --  Pain Score 06/22/17 0714 10     Pain Loc --      Pain Edu? --      Excl. in [place]? --     Vital signs reviewed, nursing assessments reviewed.   Constitutional:   Alert and oriented. Well appearing and in no distress. Eyes:   No scleral icterus.  EOMI. No nystagmus. No conjunctival pallor. PERRL. ENT   Head:   Normocephalic and atraumatic.   Nose:   No congestion/rhinnorhea.    Mouth/Throat:   MMM, no pharyngeal erythema. No peritonsillar mass.    Neck:   No meningismus. Full ROM. Hematological/Lymphatic/Immunilogical:   No cervical lymphadenopathy. Cardiovascular:   RRR. Symmetric bilateral radial and DP pulses.  No murmurs.  Respiratory:   Normal respiratory effort without tachypnea/retractions. Breath sounds are clear and equal bilaterally. No wheezes/rales/rhonchi. Gastrointestinal:   Soft with minimal diffuse tenderness, nonfocal. Non distended. There is no CVA tenderness.  No rebound, rigidity, or guarding.ostomy site is producing watery brown stool, there is approximately 50% leakage, soaking over her right lower quadrant drain  site and surgical incision in the midline. There is a small amount of erythema and induration around the right lower quadrant drain site and dermatitis around the ostomy. Midline incision is intact and nontender. There is approximately 2 cm open area which she reports was intentionally opened by surgery to allow for drainage. There is a gray watery drainage through the site. Fascia is intact on probing. Genitourinary:   deferred Musculoskeletal:   Normal range of motion in all extremities. No joint effusions.  No lower extremity tenderness.  No edema. Neurologic:   Normal speech and language.  Motor grossly intact. No gross focal neurologic deficits are appreciated.  Skin:    Skin is warm, dry with skin findings as above  ____________________________________________    LABS (pertinent positives/negatives) (all labs ordered are listed, but only abnormal results are displayed) Labs Reviewed  COMPREHENSIVE METABOLIC PANEL - Abnormal; Notable for the following:       Result Value   Sodium 129 (*)    Chloride 92 (*)    Glucose, Bld 123 (*)    BUN 44 (*)    Calcium 8.8 (*)    Total Protein 8.8 (*)    Albumin 3.2 (*)    ALT 12 (*)    All other components within normal limits  CBC WITH DIFFERENTIAL/PLATELET - Abnormal; Notable for the following:    WBC 12.5 (*)    Hemoglobin 10.6 (*)    HCT 31.8 (*)    MCV 79.3 (*)    RDW 14.8 (*)    Platelets 931 (*)    Neutro Abs 9.9 (*)    All other components within normal limits   ____________________________________________   EKG    ____________________________________________    RADIOLOGY  No results found.  ____________________________________________   PROCEDURES Procedures  ____________________________________________   DIFFERENTIAL DIAGNOSIS  ostomy dermatitis, cellulitis, surgical incision infection  CLINICAL IMPRESSION / ASSESSMENT AND PLAN / ED COURSE  Pertinent labs & imaging results that were available during  my care of the patient were reviewed by me and considered in my medical decision making (see chart for details).   patient is nontoxic, not acute distress, presents with concern about her leaking ostomy bag causing skin irritation and dermatitis. May be complicated by some focal areas of cellulitis as well. Reviewing the electronic medical record, she has had a complicated course recently, and so I will consult general surgery who knows her best  and can advise if they're happy with the progress of her healing related to the incision and if they're concerned about cellulitis because of the leaking or if this is all expected. We will attempt to coordinate a wound ostomy nurse evaluation in the ED as well if available. She has an appointment with ostomy nurse at 1 PM today.  Clinical Course as of Jun 22 1102  Wed Jun 22, 2017  2725 Surg. Dr. Dahlia Byes is in OR. Message relayed with OR nurse to eval pt when available. Pt is stable to wait for consult.  [PS]  3664 D/w Pabon, who is at bedside. Will f/u recs.   [PS]  [id number] D/w surgery, rec routine wound/ostomy care, f/u clinic. He did remove retention tubing at bedside.  No change in antibiotics at this time.  [PS]  1000 Overall cbc stable, not indicative of an escalating inflammatory reaction. Platelets elevated but nonspecific. Platelets: (!!) 931 [PS]    Clinical Course User Index [PS] Carrie Mew, MD     ----------------------------------------- 11:03 AM on 06/22/2017 -----------------------------------------  Wound ostomy nurse not available to consult in the ED. We'll have the patient keep her appointment at 1 PM. No ostomy bag appliance was placed after cleaning the patient, but due to skin breakdown is difficult to get a good seal. Labs unremarkable. HR improved with IVF.  ____________________________________________   FINAL CLINICAL IMPRESSION(S) / ED DIAGNOSES    Final diagnoses:  Postoperative pain  Altered bowel elimination due  to intestinal ostomy (HCC)  dehydration    New Prescriptions   No medications on file     Portions of this note were generated with dragon dictation software. Dictation errors may occur despite best attempts at proofreading.    Carrie Mew, MD 06/22/17 1104    Carrie Mew, MD 06/22/17 [phone]

## 2017-06-22 NOTE — Progress Notes (Signed)
Pt seen and examined. Main issues is ileostomy leaking.  Her abdomen is soft, drain in place w purulent drainage ( she has not empty the drain since DC) She is clinically doing well, takin PO , afebrile Red rubber removed to allow a better seal os ostomy device. D/W ER MD and RN about the situation. They will reapply the device and contact the ostomy nurse. No need for surgical intervention at this time

## 2017-06-22 NOTE — ED Notes (Signed)
Ostomy dressing replaced at this time.  Skin cleaned and dried.  Skin around ostomy is very reddened and raw.  There is a small open area below patient's ostomy.  Bowel movements coming from ostomy have been very liquid.  Patient states she has been drinking a lot of water and ensure and has not felt like eating solid food.

## 2017-06-22 NOTE — ED Notes (Signed)
Date and time results received: 06/22/17 09:50  Test: Platelets Critical Value:931  Name of Provider Notified: Dr. Joni Fears  Orders Received? Or Actions Taken?: Acknowledged

## 2017-06-23 ENCOUNTER — Ambulatory Visit (INDEPENDENT_AMBULATORY_CARE_PROVIDER_SITE_OTHER): Payer: 59 | Admitting: General Surgery

## 2017-06-23 ENCOUNTER — Encounter: Payer: Self-pay | Admitting: General Surgery

## 2017-06-23 VITALS — BP 114/78 | HR 80 | Temp 98.3°F

## 2017-06-23 DIAGNOSIS — Z4889 Encounter for other specified surgical aftercare: Secondary | ICD-10-CM

## 2017-06-23 MED ORDER — NYSTATIN 100000 UNIT/GM EX POWD: Freq: Four times a day (QID) | CUTANEOUS | 0 refills | Status: DC

## 2017-06-23 NOTE — Progress Notes (Signed)
Outpatient Surgical Follow Up  06/23/2017  Rebecca Obrien is an 51 y.o. female.   Chief Complaint  Patient presents with  . Routine Post Op    EEA colorectal anastomotic disruption/leak    HPI: 51 year old female returns to clinic now 2 weeks status post drainage of pelvic abscess and loop ileostomy creation secondary toanastomotic breakdown from her previous colon resection.Patient was seen in the ER yesterday secondary to leaking around her ostomy site causing skin irritation. She states that she is maintaining hydration but her appetite has been poor. She is tolerating what she is eating but she's not eating much. She is voiding as usual and having good ostomy output. She denies any current fevers, chills, nausea, vomiting, chest pain, shortness of breath. Her primary complaint is of skin breakdown around her ostomy site  Past Medical History:  Diagnosis Date  . Arthritis    RA  . Cancer ([place])    skin melanoma  . Collagen vascular disease (HCC)    RA  . Difficult airway   . 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 Diverticulitis of sigmoid colon   . History of kidney stones   . Hyperlipidemia   . Hypertension   . Hyperthyroidism   . Hypokalemia   . Obesity   . Rheumatoid arthritis(714.0)   . Stroke [facility]) 2013   no residual    Past Surgical History:  Procedure Laterality Date  . ABDOMINAL HYSTERECTOMY    . CARPAL TUNNEL RELEASE  Bilateral  . CHOLECYSTECTOMY    . CYSTOSCOPY WITH STENT PLACEMENT Bilateral 05/31/2017   Procedure: CYSTOSCOPY WITH STENT PLACEMENT- LIGHTED STENTS;  Surgeon: Hollice Espy, MD;  Location: [facility] OR[facility];  Service: Urology;  Laterality: Bilateral;  . ILEOSTOMY N/A 06/08/2017   Procedure: ILEOSTOMY;  Surgeon: Vickie Epley, MD;  Location: [place];  Service: General;  Laterality: N/A;  . LAPAROSCOPIC SIGMOID COLECTOMY N/A 05/31/2017   Procedure: LAPAROSCOPIC SIGMOID COLECTOMY;  Surgeon: Clayburn Pert, MD;   Location: [facility] OR[facility];  Service: General;  Laterality: N/A;  . LAPAROTOMY N/A 06/08/2017   Procedure: EXPLORATORY LAPAROTOMY;  Surgeon: Vickie Epley, MD;  Location: [facility] ORS;  Service: General;  Laterality: N/A;  . TEE WITHOUT CARDIOVERSION  07/26/2012   Procedure: TRANSESOPHAGEAL ECHOCARDIOGRAM (TEE);  Surgeon: Thayer Headings, MD;  Location: [place];  Service: Cardiovascular;  Laterality: N/A;  . TONSILLECTOMY    . TOTAL THYROIDECTOMY      Family History  Problem Relation Age of Onset  . Nephrolithiasis Mother   . Hypertension Mother   . Hypertension Father   . Heart failure Father   . Cardiomyopathy Brother     Social History:  reports that she has been smoking Cigarettes.  She has a 5.75 pack-year smoking history. She has never used smokeless tobacco. She reports that she does not drink alcohol or use drugs.  Allergies:  Allergies  Allergen Reactions  . Adhesive [Tape]     Skin breakdown/dermatitis with prolonged use   . Latex Dermatitis    With prolonged use  NOT LATEX ALLERGIC.  Patient has problem with adhesive, not latex    Medications reviewed.    ROS A multipoint review of systems was completed, all pertinent positives and negatives are documented within the history of present illness and remainder are negative   BP 114/78   Pulse 80   Temp 98.3 F (36.8 C) (Oral)   Physical Exam Gen.: No acute distress  chest: Clear to  auscultation Heart: Regular rhythm Abdomen: Soft, appropriate tender to palpation at her incision sites, nondistended.multiple areas of skin breakdownwith what appears to be a topical candida infection around her groin. Midline staples are well approximated with the exception of 1 cm of skin breakdown below the umbilicus that has packing it. Bilateral JP drains draining a purulent fluid.    Results for orders placed or performed during the hospital encounter of 06/22/17 (from the past 48 hour(s))  Comprehensive metabolic panel      Status: Abnormal   Collection Time: 06/22/17  9:18 AM  Result Value Ref Range   Sodium 129 (L) 135 - 145 mmol/L   Potassium 4.5 3.5 - 5.1 mmol/L   Chloride 92 (L) 101 - 111 mmol/L   CO2 23 22 - 32 mmol/L   Glucose, Bld 123 (H) 65 - 99 mg/dL   BUN 44 (H) 6 - 20 mg/dL   Creatinine, Ser 0.97 0.44 - 1.00 mg/dL   Calcium 8.8 (L) 8.9 - 10.3 mg/dL   Total Protein 8.8 (H) 6.5 - 8.1 g/dL   Albumin 3.2 (L) 3.5 - 5.0 g/dL   AST 24 15 - 41 U/L   ALT 12 (L) 14 - 54 U/L   Alkaline Phosphatase 126 38 - 126 U/L   Total Bilirubin 0.8 0.3 - 1.2 mg/dL   GFR calc non Af Amer >60 >60 mL/min   GFR calc Af Amer >60 >60 mL/min    Comment: (NOTE) The eGFR has been calculated using the CKD EPI equation. This calculation has not been validated in all clinical situations. eGFR's persistently <60 mL/min signify possible Chronic Kidney Disease.    Anion gap 14 5 - 15  CBC with Differential     Status: Abnormal   Collection Time: 06/22/17  9:18 AM  Result Value Ref Range   WBC 12.5 (H) 3.6 - 11.0 K/uL   RBC 4.00 3.80 - 5.20 MIL/uL   Hemoglobin 10.6 (L) 12.0 - 16.0 g/dL   HCT 31.8 (L) 35.0 - 47.0 %   MCV 79.3 (L) 80.0 - 100.0 fL   MCH 26.5 26.0 - 34.0 pg   MCHC 33.4 32.0 - 36.0 g/dL   RDW 14.8 (H) 11.5 - 14.5 %   Platelets 931 (HH) 150 - 440 K/uL    Comment: CRITICAL RESULT CALLED TO, READ BACK BY AND VERIFIED WITH: ANNA HOLT@0952  ON 06/22/17 BY HKP    Neutrophils Relative % 80 %   Neutro Abs 9.9 (H) 1.4 - 6.5 K/uL   Lymphocytes Relative 12 %   Lymphs Abs 1.5 1.0 - 3.6 K/uL   Monocytes Relative 7 %   Monocytes Absolute 0.9 0.2 - 0.9 K/uL   Eosinophils Relative 0 %   Eosinophils Absolute 0.0 0 - 0.7 K/uL   Basophils Relative 1 %   Basophils Absolute 0.1 0 - 0.1 K/uL   No results found.  Assessment/Plan:  1. Aftercare following surgery 51 year old female with drained pelvic abscess and loop ileostomy creation. Happy now that she has ostomy supplies that maintain a seal. Discussed using ostomy  powder in nystatin powder to help her skin. Staples removed today and replaced with Steri-Strips. Discussed continuing wound care to the area of the umbilical wound. Patient voiced understanding. Also discussed that when the drain output becomes 0 mL per 24 hours that we would then repeat a CT scan prior to removing her drains. Discussed best case scenario would be to reverse her loop ileostomy approximately 3 months after we confirmed that her anastomotic  leak has sealed. Patient voiced understanding and will follow-up in 1 week for an additional wound check.     Clayburn Pert, MD FACS General Surgeon  06/23/2017,11:44 AM

## 2017-06-23 NOTE — Patient Instructions (Signed)
Please follow-up next week as scheduled below.  Pick -up your Nystatin powder at the pharmacy. We will send a prescription to [facility] for your stoma powder and the rest of your colostomy supplies.   Please call our office with any questions or concerns.

## 2017-06-24 ENCOUNTER — Telehealth: Payer: Self-pay

## 2017-06-24 ENCOUNTER — Other Ambulatory Visit: Payer: Self-pay | Admitting: *Deleted

## 2017-06-24 NOTE — Telephone Encounter (Signed)
Called patient to let her know that I had called [facility] and asked how she could order her stoma powder. I was told that she is needing to order it from a third party (patient's home health). When I called the patient I explained it to her and gave her the reference number for the stoma powder (Reference # [id number]). Patient stated that she had given that information to her home health nurse.

## 2017-06-24 NOTE — Telephone Encounter (Signed)
Office visit notes from 06/23/17 visit, recent hospitalization discharge summary, op notes from surgeries on 05/31/17 and 06/09/17, and new disability note were sent over via fax to [place] [phone] at this time with positive confirmation with Claim #: [id number]. We have extended leave per Dr. Adonis Huguenin for this patient until 09/23/2017.

## 2017-06-24 NOTE — Patient Outreach (Addendum)
[facility] [facility]) Care Management  06/24/2017  Rebecca Obrien February 06, 1966 [id number]  Late entry:   Subjective:  Case discussed in Difficult Case Discussion on 06/23/17, transition of care pending patient contact, and no additional CM intervention needed at this time.   Objective: Per chart review, Patient has ED visit on 06/22/17 for Postoperative pain and ostomy leaking.  Patient hospitalized 06/08/17 - 06/20/17 for Large intestine anastomotic leak.  Status post  Exploratory laparotomy, washout of stool/abscess, Placement of pelvic 51F Blake drains x 2,  Creation of diverting loop ileostomy on 06/09/17.  Patient hospitalized 05/2517 -05/2817 for persistent diverticulitis. Status post Laparoscopic sigmoid colectomy with immediate anastomosis, mobilization of splenic flexure on 05/31/17. Patient hospitalized 05/03/17 - 05/07/17 for Diverticulitis of intestine with abscess. Patient hospitalized 04/27/17 -04/30/17 for Diverticulitis with microperforation. Patient also has a history of Rheumatoid arthritis, hypertension, hyperlipidemia, skin melanomaand stroke.    Assessment:Received UMR Transition of care referral on 05/27/17. Transition of care follow up pending patient contact.    Plan: RNCM will call patient for 4th telephone outreach attempt, transition of care follow up, within 10 business days if no return call.       Rebecca Obrien H. [name], BSN, [place] Management [facility] Telephonic CM Phone: [phone] Fax: [phone]

## 2017-06-24 NOTE — Telephone Encounter (Signed)
    Patient was in yesterday to see Dr. Renato Battles requested for her disability forms to be refaxed with new information and OV. Dr. Adonis Huguenin stated that she would be out of work for another 3 months. Please fix her disability form as soon as possible since she needs to get paid. Thank you!   If you have any questions, refer to Dr. Reginal Lutes OV notes.

## 2017-06-25 DIAGNOSIS — F17210 Nicotine dependence, cigarettes, uncomplicated: Secondary | ICD-10-CM | POA: Diagnosis not present

## 2017-06-25 DIAGNOSIS — M0579 Rheumatoid arthritis with rheumatoid factor of multiple sites without organ or systems involvement: Secondary | ICD-10-CM | POA: Diagnosis not present

## 2017-06-25 DIAGNOSIS — Z79891 Long term (current) use of opiate analgesic: Secondary | ICD-10-CM | POA: Diagnosis not present

## 2017-06-25 DIAGNOSIS — T8132XD Disruption of internal operation (surgical) wound, not elsewhere classified, subsequent encounter: Secondary | ICD-10-CM | POA: Diagnosis not present

## 2017-06-25 DIAGNOSIS — Z8673 Personal history of transient ischemic attack (TIA), and cerebral infarction without residual deficits: Secondary | ICD-10-CM | POA: Diagnosis not present

## 2017-06-25 DIAGNOSIS — Z432 Encounter for attention to ileostomy: Secondary | ICD-10-CM | POA: Diagnosis not present

## 2017-06-25 DIAGNOSIS — M545 Low back pain: Secondary | ICD-10-CM | POA: Diagnosis not present

## 2017-06-25 DIAGNOSIS — Z4803 Encounter for change or removal of drains: Secondary | ICD-10-CM | POA: Diagnosis not present

## 2017-06-27 ENCOUNTER — Encounter: Payer: Self-pay | Admitting: *Deleted

## 2017-06-27 ENCOUNTER — Other Ambulatory Visit: Payer: Self-pay | Admitting: *Deleted

## 2017-06-27 DIAGNOSIS — Z932 Ileostomy status: Secondary | ICD-10-CM | POA: Diagnosis not present

## 2017-06-27 DIAGNOSIS — K5732 Diverticulitis of large intestine without perforation or abscess without bleeding: Secondary | ICD-10-CM | POA: Diagnosis not present

## 2017-06-27 NOTE — Patient Outreach (Signed)
[facility]) Care Management  06/27/2017  Rebecca Obrien 05/24/1966 [id number]   Subjective: Telephone call to patient's home  / mobile number, no answer, left HIPAA compliant voicemail message, and requested call back.   Objective: Per chart review, Patient has ED visit on 06/22/17 for Postoperative pain and ostomy leaking.  Patient hospitalized 06/08/17 - 06/20/17 for Large intestine anastomotic leak. Status post Exploratory laparotomy, washout of stool/abscess, Placement of pelvic 36F Blake drains x 2, Creation of diverting loop ileostomy on 06/09/17. Patient hospitalized 05/2517 -05/2817 for persistent diverticulitis. Status post Laparoscopic sigmoid colectomy with immediate anastomosis, mobilization of splenic flexure on 05/31/17. Patient hospitalized 05/03/17 - 05/07/17 for Diverticulitis of intestine with abscess. Patient hospitalized 04/27/17 -04/30/17 for Diverticulitis with microperforation. Patient also has a history of Rheumatoid arthritis, hypertension, hyperlipidemia, skin melanomaand stroke.    Assessment:Received UMR Transition of care referral on 05/27/17. Transition of care follow up pending patient contact.    Plan: RNCM will send unsuccessful outreach  letter, [facility] pamphlet, and proceed with case closure, within 10 business days if no return call.       [name] H. [name], BSN, [place] Management [facility] Telephonic CM Phone: [phone] Fax: [phone]

## 2017-06-28 DIAGNOSIS — T8132XD Disruption of internal operation (surgical) wound, not elsewhere classified, subsequent encounter: Secondary | ICD-10-CM | POA: Diagnosis not present

## 2017-06-28 DIAGNOSIS — Z79891 Long term (current) use of opiate analgesic: Secondary | ICD-10-CM | POA: Diagnosis not present

## 2017-06-28 DIAGNOSIS — M545 Low back pain: Secondary | ICD-10-CM | POA: Diagnosis not present

## 2017-06-28 DIAGNOSIS — Z8673 Personal history of transient ischemic attack (TIA), and cerebral infarction without residual deficits: Secondary | ICD-10-CM | POA: Diagnosis not present

## 2017-06-28 DIAGNOSIS — Z4803 Encounter for change or removal of drains: Secondary | ICD-10-CM | POA: Diagnosis not present

## 2017-06-28 DIAGNOSIS — M0579 Rheumatoid arthritis with rheumatoid factor of multiple sites without organ or systems involvement: Secondary | ICD-10-CM | POA: Diagnosis not present

## 2017-06-28 DIAGNOSIS — Z432 Encounter for attention to ileostomy: Secondary | ICD-10-CM | POA: Diagnosis not present

## 2017-06-28 DIAGNOSIS — F17210 Nicotine dependence, cigarettes, uncomplicated: Secondary | ICD-10-CM | POA: Diagnosis not present

## 2017-06-30 DIAGNOSIS — Z79891 Long term (current) use of opiate analgesic: Secondary | ICD-10-CM | POA: Diagnosis not present

## 2017-06-30 DIAGNOSIS — M0579 Rheumatoid arthritis with rheumatoid factor of multiple sites without organ or systems involvement: Secondary | ICD-10-CM | POA: Diagnosis not present

## 2017-06-30 DIAGNOSIS — T8132XD Disruption of internal operation (surgical) wound, not elsewhere classified, subsequent encounter: Secondary | ICD-10-CM | POA: Diagnosis not present

## 2017-06-30 DIAGNOSIS — Z4803 Encounter for change or removal of drains: Secondary | ICD-10-CM | POA: Diagnosis not present

## 2017-06-30 DIAGNOSIS — F17210 Nicotine dependence, cigarettes, uncomplicated: Secondary | ICD-10-CM | POA: Diagnosis not present

## 2017-06-30 DIAGNOSIS — Z8673 Personal history of transient ischemic attack (TIA), and cerebral infarction without residual deficits: Secondary | ICD-10-CM | POA: Diagnosis not present

## 2017-06-30 DIAGNOSIS — Z432 Encounter for attention to ileostomy: Secondary | ICD-10-CM | POA: Diagnosis not present

## 2017-06-30 DIAGNOSIS — M545 Low back pain: Secondary | ICD-10-CM | POA: Diagnosis not present

## 2017-07-01 ENCOUNTER — Other Ambulatory Visit
Admission: RE | Admit: 2017-07-01 | Discharge: 2017-07-01 | Disposition: A | Discharge status: Home / Self Care | Payer: 59 | Source: Ambulatory Visit | Attending: Surgery | Admitting: Surgery

## 2017-07-01 ENCOUNTER — Inpatient Hospital Stay
Admission: EM | Admit: 2017-07-01 | Discharge: 2017-07-06 | DRG: 682 | Disposition: A | Discharge status: Home Health | Payer: 59 | Attending: Internal Medicine | Admitting: Internal Medicine

## 2017-07-01 ENCOUNTER — Other Ambulatory Visit: Payer: Self-pay

## 2017-07-01 ENCOUNTER — Ambulatory Visit
Admission: RE | Admit: 2017-07-01 | Discharge: 2017-07-01 | Disposition: A | Discharge status: Home / Self Care | Payer: 59 | Source: Ambulatory Visit | Attending: Surgery | Admitting: Surgery

## 2017-07-01 ENCOUNTER — Encounter: Payer: Self-pay | Admitting: Surgery

## 2017-07-01 ENCOUNTER — Ambulatory Visit (INDEPENDENT_AMBULATORY_CARE_PROVIDER_SITE_OTHER): Payer: 59 | Admitting: Surgery

## 2017-07-01 ENCOUNTER — Telehealth: Payer: Self-pay

## 2017-07-01 DIAGNOSIS — Z23 Encounter for immunization: Secondary | ICD-10-CM

## 2017-07-01 DIAGNOSIS — M069 Rheumatoid arthritis, unspecified: Secondary | ICD-10-CM | POA: Diagnosis present

## 2017-07-01 DIAGNOSIS — F17210 Nicotine dependence, cigarettes, uncomplicated: Secondary | ICD-10-CM | POA: Diagnosis present

## 2017-07-01 DIAGNOSIS — K651 Peritoneal abscess: Secondary | ICD-10-CM | POA: Diagnosis not present

## 2017-07-01 DIAGNOSIS — N179 Acute kidney failure, unspecified: Secondary | ICD-10-CM | POA: Diagnosis not present

## 2017-07-01 DIAGNOSIS — Z91040 Latex allergy status: Secondary | ICD-10-CM

## 2017-07-01 DIAGNOSIS — E860 Dehydration: Secondary | ICD-10-CM | POA: Diagnosis not present

## 2017-07-01 DIAGNOSIS — E871 Hypo-osmolality and hyponatremia: Secondary | ICD-10-CM | POA: Diagnosis not present

## 2017-07-01 DIAGNOSIS — E785 Hyperlipidemia, unspecified: Secondary | ICD-10-CM | POA: Diagnosis present

## 2017-07-01 DIAGNOSIS — K5732 Diverticulitis of large intestine without perforation or abscess without bleeding: Secondary | ICD-10-CM

## 2017-07-01 DIAGNOSIS — E43 Unspecified severe protein-calorie malnutrition: Secondary | ICD-10-CM | POA: Diagnosis present

## 2017-07-01 DIAGNOSIS — I10 Essential (primary) hypertension: Secondary | ICD-10-CM | POA: Diagnosis present

## 2017-07-01 DIAGNOSIS — E890 Postprocedural hypothyroidism: Secondary | ICD-10-CM | POA: Diagnosis present

## 2017-07-01 DIAGNOSIS — Z8673 Personal history of transient ischemic attack (TIA), and cerebral infarction without residual deficits: Secondary | ICD-10-CM

## 2017-07-01 DIAGNOSIS — E669 Obesity, unspecified: Secondary | ICD-10-CM | POA: Diagnosis present

## 2017-07-01 DIAGNOSIS — Z6824 Body mass index (BMI) 24.0-24.9, adult: Secondary | ICD-10-CM

## 2017-07-01 DIAGNOSIS — Z79899 Other long term (current) drug therapy: Secondary | ICD-10-CM

## 2017-07-01 DIAGNOSIS — Z79890 Hormone replacement therapy (postmenopausal): Secondary | ICD-10-CM

## 2017-07-01 DIAGNOSIS — E875 Hyperkalemia: Secondary | ICD-10-CM | POA: Diagnosis present

## 2017-07-01 DIAGNOSIS — Z960 Presence of urogenital implants: Secondary | ICD-10-CM | POA: Diagnosis present

## 2017-07-01 DIAGNOSIS — Z90710 Acquired absence of both cervix and uterus: Secondary | ICD-10-CM | POA: Diagnosis not present

## 2017-07-01 DIAGNOSIS — F419 Anxiety disorder, unspecified: Secondary | ICD-10-CM | POA: Diagnosis not present

## 2017-07-01 DIAGNOSIS — Z9049 Acquired absence of other specified parts of digestive tract: Secondary | ICD-10-CM | POA: Diagnosis not present

## 2017-07-01 DIAGNOSIS — Z8249 Family history of ischemic heart disease and other diseases of the circulatory system: Secondary | ICD-10-CM

## 2017-07-01 DIAGNOSIS — Z9221 Personal history of antineoplastic chemotherapy: Secondary | ICD-10-CM

## 2017-07-01 DIAGNOSIS — K219 Gastro-esophageal reflux disease without esophagitis: Secondary | ICD-10-CM | POA: Diagnosis present

## 2017-07-01 DIAGNOSIS — Z85820 Personal history of malignant melanoma of skin: Secondary | ICD-10-CM | POA: Diagnosis not present

## 2017-07-01 DIAGNOSIS — Z932 Ileostomy status: Secondary | ICD-10-CM

## 2017-07-01 DIAGNOSIS — Z91048 Other nonmedicinal substance allergy status: Secondary | ICD-10-CM

## 2017-07-01 DIAGNOSIS — L89151 Pressure ulcer of sacral region, stage 1: Secondary | ICD-10-CM | POA: Diagnosis present

## 2017-07-01 DIAGNOSIS — K7689 Other specified diseases of liver: Secondary | ICD-10-CM | POA: Diagnosis not present

## 2017-07-01 DIAGNOSIS — L8990 Pressure ulcer of unspecified site, unspecified stage: Secondary | ICD-10-CM | POA: Insufficient documentation

## 2017-07-01 LAB — COMPREHENSIVE METABOLIC PANEL
ALBUMIN: 3.3 g/dL — AB (ref 3.5–5.0)
ALK PHOS: 130 U/L — AB (ref 38–126)
ALK PHOS: 126 U/L (ref 38–126)
ALT: 14 U/L (ref 14–54)
ALT: 14 U/L (ref 14–54)
AST: 20 U/L (ref 15–41)
AST: 20 U/L (ref 15–41)
Albumin: 3.4 g/dL — ABNORMAL LOW (ref 3.5–5.0)
Anion gap: 13 (ref 5–15)
Anion gap: 13 (ref 5–15)
BILIRUBIN TOTAL: 0.8 mg/dL (ref 0.3–1.2)
BUN: 50 mg/dL — AB (ref 6–20)
BUN: 49 mg/dL — ABNORMAL HIGH (ref 6–20)
CALCIUM: 9.8 mg/dL (ref 8.9–10.3)
CALCIUM: 9.4 mg/dL (ref 8.9–10.3)
CO2: 21 mmol/L — ABNORMAL LOW (ref 22–32)
CO2: 21 mmol/L — AB (ref 22–32)
CREATININE: 1.44 mg/dL — AB (ref 0.44–1.00)
CREATININE: 1.38 mg/dL — AB (ref 0.44–1.00)
Chloride: 91 mmol/L — ABNORMAL LOW (ref 101–111)
Chloride: 89 mmol/L — ABNORMAL LOW (ref 101–111)
GFR calc Af Amer: 50 mL/min — ABNORMAL LOW (ref >60)
GFR calc non Af Amer: 43 mL/min — ABNORMAL LOW (ref >60)
GFR, EST AFRICAN AMERICAN: 47 mL/min — AB (ref >60)
GFR, EST NON AFRICAN AMERICAN: 41 mL/min — AB (ref >60)
GLUCOSE: 96 mg/dL (ref 65–99)
Glucose, Bld: 103 mg/dL — ABNORMAL HIGH (ref 65–99)
Potassium: 6 mmol/L — ABNORMAL HIGH (ref 3.5–5.1)
Potassium: 5.5 mmol/L — ABNORMAL HIGH (ref 3.5–5.1)
SODIUM: 123 mmol/L — AB (ref 135–145)
Sodium: 125 mmol/L — ABNORMAL LOW (ref 135–145)
TOTAL PROTEIN: 8.6 g/dL — AB (ref 6.5–8.1)
Total Bilirubin: 0.8 mg/dL (ref 0.3–1.2)
Total Protein: 8.4 g/dL — ABNORMAL HIGH (ref 6.5–8.1)

## 2017-07-01 LAB — CBC WITH DIFFERENTIAL/PLATELET
BASOS ABS: 0 10*3/uL (ref 0–0.1)
BASOS PCT: 0 %
Basophils Absolute: 0 10*3/uL (ref 0–0.1)
Basophils Relative: 0 %
EOS ABS: 0 10*3/uL (ref 0–0.7)
Eosinophils Absolute: 0 10*3/uL (ref 0–0.7)
Eosinophils Relative: 0 %
Eosinophils Relative: 0 %
HCT: 31.4 % — ABNORMAL LOW (ref 35.0–47.0)
HEMATOCRIT: 33.2 % — AB (ref 35.0–47.0)
Hemoglobin: 11.2 g/dL — ABNORMAL LOW (ref 12.0–16.0)
Hemoglobin: 10.7 g/dL — ABNORMAL LOW (ref 12.0–16.0)
LYMPHS ABS: 2.1 10*3/uL (ref 1.0–3.6)
LYMPHS PCT: 18 %
Lymphocytes Relative: 20 %
Lymphs Abs: 2.5 10*3/uL (ref 1.0–3.6)
MCH: 26.4 pg (ref 26.0–34.0)
MCH: 26.5 pg (ref 26.0–34.0)
MCHC: 33.6 g/dL (ref 32.0–36.0)
MCHC: 33.9 g/dL (ref 32.0–36.0)
MCV: 78.5 fL — AB (ref 80.0–100.0)
MCV: 78.2 fL — ABNORMAL LOW (ref 80.0–100.0)
MONO ABS: 0.9 10*3/uL (ref 0.2–0.9)
MONOS PCT: 7 %
Monocytes Absolute: 0.9 10*3/uL (ref 0.2–0.9)
Monocytes Relative: 8 %
NEUTROS ABS: 8.9 10*3/uL — AB (ref 1.4–6.5)
NEUTROS PCT: 74 %
Neutro Abs: 9.1 10*3/uL — ABNORMAL HIGH (ref 1.4–6.5)
Neutrophils Relative %: 73 %
PLATELETS: 587 10*3/uL — AB (ref 150–440)
Platelets: 612 10*3/uL — ABNORMAL HIGH (ref 150–440)
RBC: 4.23 MIL/uL (ref 3.80–5.20)
RBC: 4.02 MIL/uL (ref 3.80–5.20)
RDW: 15.6 % — ABNORMAL HIGH (ref 11.5–14.5)
RDW: 15.6 % — ABNORMAL HIGH (ref 11.5–14.5)
WBC: 12 10*3/uL — AB (ref 3.6–11.0)
WBC: 12.5 10*3/uL — ABNORMAL HIGH (ref 3.6–11.0)

## 2017-07-01 LAB — GLUCOSE, CAPILLARY
Glucose-Capillary: 83 mg/dL (ref 65–99)
Glucose-Capillary: 73 mg/dL (ref 65–99)
Glucose-Capillary: 103 mg/dL — ABNORMAL HIGH (ref 65–99)

## 2017-07-01 LAB — POTASSIUM: Potassium: 4.4 mmol/L (ref 3.5–5.1)

## 2017-07-01 MED ORDER — LEVOTHYROXINE SODIUM 112 MCG PO TABS
112.0000 ug | ORAL_TABLET | Freq: Every day | ORAL | Status: DC
  Administered 2017-07-02 – 2017-07-06 (×5): 112 ug via ORAL
  Filled 2017-07-01 (×5): qty 1

## 2017-07-01 MED ORDER — DEXTROSE 50 % IV SOLN
1.0000 | Freq: Once | INTRAVENOUS | Status: AC
  Administered 2017-07-01: 50 mL via INTRAVENOUS
  Filled 2017-07-01: qty 50

## 2017-07-01 MED ORDER — HYDROCODONE-ACETAMINOPHEN 5-325 MG PO TABS: 1.0000 | ORAL_TABLET | ORAL | Status: DC | PRN

## 2017-07-01 MED ORDER — METRONIDAZOLE 500 MG PO TABS: 500.0000 mg | ORAL_TABLET | Freq: Three times a day (TID) | ORAL | 0 refills | Status: DC

## 2017-07-01 MED ORDER — ONDANSETRON HCL 4 MG/2ML IJ SOLN: 4.0000 mg | Freq: Four times a day (QID) | INTRAMUSCULAR | Status: DC | PRN

## 2017-07-01 MED ORDER — ACETAMINOPHEN 325 MG PO TABS: 650.0000 mg | ORAL_TABLET | Freq: Four times a day (QID) | ORAL | Status: DC | PRN

## 2017-07-01 MED ORDER — PANTOPRAZOLE SODIUM 40 MG PO TBEC
40.0000 mg | DELAYED_RELEASE_TABLET | Freq: Once | ORAL | Status: AC
  Administered 2017-07-01: 40 mg via ORAL
  Filled 2017-07-01: qty 1

## 2017-07-01 MED ORDER — ENOXAPARIN SODIUM 40 MG/0.4ML ~~LOC~~ SOLN
40.0000 mg | SUBCUTANEOUS | Status: DC
  Administered 2017-07-01 – 2017-07-05 (×5): 40 mg via SUBCUTANEOUS
  Filled 2017-07-01 (×5): qty 0.4

## 2017-07-01 MED ORDER — CIPROFLOXACIN HCL 500 MG PO TABS
500.0000 mg | ORAL_TABLET | Freq: Two times a day (BID) | ORAL | Status: DC
  Administered 2017-07-01 – 2017-07-06 (×10): 500 mg via ORAL
  Filled 2017-07-01 (×10): qty 1

## 2017-07-01 MED ORDER — METRONIDAZOLE 500 MG PO TABS
500.0000 mg | ORAL_TABLET | Freq: Three times a day (TID) | ORAL | Status: DC
  Administered 2017-07-01 – 2017-07-06 (×14): 500 mg via ORAL
  Filled 2017-07-01 (×17): qty 1

## 2017-07-01 MED ORDER — IOPAMIDOL (ISOVUE-300) INJECTION 61%: 100.0000 mL | Freq: Once | INTRAVENOUS | Status: DC | PRN

## 2017-07-01 MED ORDER — LORAZEPAM 1 MG PO TABS
ORAL_TABLET | ORAL | Status: AC
  Administered 2017-07-01: 1 mg via ORAL
  Filled 2017-07-01: qty 1

## 2017-07-01 MED ORDER — ALBUTEROL SULFATE (2.5 MG/3ML) 0.083% IN NEBU
15.0000 mg | INHALATION_SOLUTION | Freq: Once | RESPIRATORY_TRACT | Status: DC
  Filled 2017-07-01: qty 18

## 2017-07-01 MED ORDER — INSULIN ASPART 100 UNIT/ML ~~LOC~~ SOLN
0.0000 [IU] | Freq: Three times a day (TID) | SUBCUTANEOUS | Status: DC
  Administered 2017-07-02: 1 [IU] via SUBCUTANEOUS
  Filled 2017-07-01: qty 1

## 2017-07-01 MED ORDER — MELOXICAM 7.5 MG PO TABS
15.0000 mg | ORAL_TABLET | Freq: Every day | ORAL | Status: DC
  Administered 2017-07-02 – 2017-07-04 (×3): 15 mg via ORAL
  Filled 2017-07-01 (×3): qty 2

## 2017-07-01 MED ORDER — CIPROFLOXACIN HCL 500 MG PO TABS
500.0000 mg | ORAL_TABLET | Freq: Two times a day (BID) | ORAL | 0 refills | Status: DC
Start: 2017-07-01 — End: 2017-07-14

## 2017-07-01 MED ORDER — PANTOPRAZOLE SODIUM 40 MG PO TBEC
40.0000 mg | DELAYED_RELEASE_TABLET | Freq: Two times a day (BID) | ORAL | Status: DC
  Administered 2017-07-02 – 2017-07-06 (×9): 40 mg via ORAL
  Filled 2017-07-01 (×10): qty 1

## 2017-07-01 MED ORDER — IOPAMIDOL (ISOVUE-300) INJECTION 61%
75.0000 mL | Freq: Once | INTRAVENOUS | Status: AC | PRN
  Administered 2017-07-01: 75 mL via INTRAVENOUS

## 2017-07-01 MED ORDER — SODIUM CHLORIDE 0.9 % IV SOLN
INTRAVENOUS | Status: DC
  Administered 2017-07-01: 22:00:00 via INTRAVENOUS
  Administered 2017-07-01: 75 mL/h via INTRAVENOUS
  Administered 2017-07-03 – 2017-07-06 (×7): via INTRAVENOUS

## 2017-07-01 MED ORDER — SODIUM CHLORIDE 0.9 % IV BOLUS (SEPSIS)
1000.0000 mL | Freq: Once | INTRAVENOUS | Status: AC
  Administered 2017-07-01: 1000 mL via INTRAVENOUS

## 2017-07-01 MED ORDER — LORAZEPAM 1 MG PO TABS
1.0000 mg | ORAL_TABLET | Freq: Once | ORAL | Status: AC
  Administered 2017-07-01: 1 mg via ORAL

## 2017-07-01 MED ORDER — ACETAMINOPHEN 650 MG RE SUPP: 650.0000 mg | Freq: Four times a day (QID) | RECTAL | Status: DC | PRN

## 2017-07-01 MED ORDER — SENNOSIDES-DOCUSATE SODIUM 8.6-50 MG PO TABS: 1.0000 | ORAL_TABLET | Freq: Every evening | ORAL | Status: DC | PRN

## 2017-07-01 MED ORDER — ONDANSETRON HCL 4 MG PO TABS: 4.0000 mg | ORAL_TABLET | Freq: Four times a day (QID) | ORAL | Status: DC | PRN

## 2017-07-01 MED ORDER — INSULIN ASPART 100 UNIT/ML ~~LOC~~ SOLN
5.0000 [IU] | Freq: Once | SUBCUTANEOUS | Status: AC
  Administered 2017-07-01: 5 [IU] via INTRAVENOUS
  Filled 2017-07-01: qty 1

## 2017-07-01 MED ORDER — ALUM & MAG HYDROXIDE-SIMETH 200-200-20 MG/5ML PO SUSP
30.0000 mL | Freq: Four times a day (QID) | ORAL | Status: DC | PRN
  Administered 2017-07-01 – 2017-07-05 (×9): 30 mL via ORAL
  Filled 2017-07-01 (×9): qty 30

## 2017-07-01 MED ORDER — LORAZEPAM 1 MG PO TABS: 1.0000 mg | ORAL_TABLET | Freq: Once | ORAL | Status: DC

## 2017-07-01 MED ORDER — CIPROFLOXACIN HCL 500 MG PO TABS: 500.0000 mg | ORAL_TABLET | Freq: Two times a day (BID) | ORAL | 0 refills | Status: DC

## 2017-07-01 MED ORDER — PANTOPRAZOLE SODIUM 40 MG PO TBEC: 40.0000 mg | DELAYED_RELEASE_TABLET | Freq: Two times a day (BID) | ORAL | 0 refills | Status: DC

## 2017-07-01 NOTE — Consult Note (Signed)
SURGICAL CONSULTATION NOTE (initial) - cpt: 99254  HISTORY OF PRESENT ILLNESS (HPI):  51 y.o. female presented to [facility] ED upon referral from outpatient surgery office following appointment today for evaluation and management of profound dehydration and electrolyte disturbances identified on outpatient labs. Patient reports she hasn't been feeling well for several days, but wasn't sure if that was how she should expect to feel following her most recent surgery 22 days ago. Prior to not feeling well, she also describes severe heartburn, due to which she has been avoiding eating or drinking much. During this time, she has been emptying her ileostomy bag 4 x per day. She otherwise denies abdominal pain, fever/chills, CP, or SOB.  Surgery is consulted by ED physician Dr. Mable Paris in this context for evaluation and management of intra-abdominal abscesses and profound dehydration with electrolyte disturbances following diverting ileostomy for colorectal anastomotic disruption.  PAST MEDICAL HISTORY (PMH):  Past Medical History:  Diagnosis Date  . Arthritis    RA  . Cancer ([place])    skin melanoma  . Collagen vascular disease (HCC)    RA  . Difficult airway   . 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 Diverticulitis of sigmoid colon   . History of kidney stones   . Hyperlipidemia   . Hypertension   . Hyperthyroidism   . Hypokalemia   . Obesity   . Rheumatoid arthritis(714.0)   . Stroke [facility]) 2013   no residual     PAST SURGICAL HISTORY ([place]):  Past Surgical History:  Procedure Laterality Date  . ABDOMINAL HYSTERECTOMY    . CARPAL TUNNEL RELEASE  Bilateral  . CHOLECYSTECTOMY    . CYSTOSCOPY WITH STENT PLACEMENT Bilateral 05/31/2017   Procedure: CYSTOSCOPY WITH STENT PLACEMENT- LIGHTED STENTS;  Surgeon: Hollice Espy, MD;  Location: [facility] ORS;  Service: Urology;  Laterality: Bilateral;  . ILEOSTOMY N/A 06/08/2017   Procedure: ILEOSTOMY;  Surgeon:  Vickie Epley, MD;  Location: [place] ORS;  Service: General;  Laterality: N/A;  . LAPAROSCOPIC SIGMOID COLECTOMY N/A 05/31/2017   Procedure: LAPAROSCOPIC SIGMOID COLECTOMY;  Surgeon: Clayburn Pert, MD;  Location: [facility] ORS;  Service: General;  Laterality: N/A;  . LAPAROTOMY N/A 06/08/2017   Procedure: EXPLORATORY LAPAROTOMY;  Surgeon: Vickie Epley, MD;  Location: [facility] ORS;  Service: General;  Laterality: N/A;  . TEE WITHOUT CARDIOVERSION  07/26/2012   Procedure: TRANSESOPHAGEAL ECHOCARDIOGRAM (TEE);  Surgeon: Thayer Headings, MD;  Location: [place];  Service: Cardiovascular;  Laterality: N/A;  . TONSILLECTOMY    . TOTAL THYROIDECTOMY       MEDICATIONS:  Prior to Admission medications   Medication Sig Start Date End Date Taking? Authorizing Provider  hydrochlorothiazide (MICROZIDE) 12.5 MG capsule Take 12.5 mg by mouth daily.  04/20/17  Yes [provider]  levothyroxine (SYNTHROID, LEVOTHROID) 112 MCG tablet Take 112 mcg by mouth daily before breakfast.   Yes [provider]  potassium chloride (K-DUR) 10 MEQ tablet Take 2 tablets by mouth 2 (two) times daily with a meal.  03/02/17 03/02/18 Yes [provider]  acetaminophen (TYLENOL) 650 MG CR tablet Take 1,300 mg by mouth every 8 (eight) hours as needed for pain.    [provider]  ciprofloxacin (CIPRO) 500 MG tablet Take 1 tablet (500 mg total) by mouth 2 (two) times daily. 07/01/17   Pabon, Diego F, MD  HYDROcodone-acetaminophen (NORCO/VICODIN) 5-325 MG tablet Take 1-2 tablets by mouth every 6 (six) hours as needed for moderate pain.  06/20/17   Pabon, [place], MD  leflunomide (ARAVA) 20 MG tablet Take 20 mg by mouth daily.  04/08/17 10/05/17  [provider]  meloxicam (MOBIC) 15 MG tablet Take 15 mg by mouth daily.  02/04/17   [provider]  metroNIDAZOLE (FLAGYL) 500 MG tablet Take 1 tablet (500 mg total) by mouth 3 (three) times daily. 07/01/17   Pabon, [place], MD  nystatin  (MYCOSTATIN/NYSTOP) powder Apply topically 4 (four) times daily. 06/23/17   Clayburn Pert, MD  pantoprazole (PROTONIX) 40 MG tablet Take 1 tablet (40 mg total) by mouth 2 (two) times daily. 07/01/17   Pabon, [place], MD  Tofacitinib Citrate (XELJANZ XR) 11 MG TB24 Take 1 tablet by mouth daily.    [provider]     ALLERGIES:  Allergies  Allergen Reactions  . Adhesive [Tape]     Skin breakdown/dermatitis with prolonged use   . Latex Dermatitis    With prolonged use  NOT LATEX ALLERGIC.  Patient has problem with adhesive, not latex     SOCIAL HISTORY:  Social History   Social History  . Marital status: Married    Spouse name: N/A  . Number of children: N/A  . Years of education: N/A   Occupational History  . Not on file.   Social History Main Topics  . Smoking status: Current Every Day Smoker    Packs/day: 0.25    Years: 23.00    Types: Cigarettes    Last attempt to quit: 07/23/2012  . Smokeless tobacco: Never Used  . Alcohol use No  . Drug use: No  . Sexual activity: Not on file   Other Topics Concern  . Not on file   Social History Narrative  . No narrative on file    The patient currently resides (home / rehab facility / nursing home): Home The patient normally is (ambulatory / bedbound): Ambulatory   FAMILY HISTORY:  Family History  Problem Relation Age of Onset  . Nephrolithiasis Mother   . Hypertension Mother   . Hypertension Father   . Heart failure Father   . Cardiomyopathy Brother      REVIEW OF SYSTEMS:  Constitutional: denies weight loss, fever, chills, or sweats  Eyes: denies any other vision changes, history of eye injury  ENT: denies sore throat, hearing problems  Respiratory: denies shortness of breath, wheezing  Cardiovascular: denies chest pain, palpitations  Gastrointestinal: abdominal pain, N/V, and bowel function as per HPI Genitourinary: denies burning with urination or urinary frequency Musculoskeletal: denies any  other joint pains or cramps  Skin: denies any other rashes or skin discolorations  Neurological: denies any other headache, dizziness, weakness  Psychiatric: denies any other depression, anxiety   All other review of systems were negative   VITAL SIGNS:  Temp:  [97.4 F (36.3 C)-97.9 F (36.6 C)] 97.9 F (36.6 C) (10/26 1614) Pulse Rate:  [75-103] 75 (10/26 1614) Resp:  [14-18] 14 (10/26 1614) BP: (96-118)/(59-89) 113/78 (10/26 1614) SpO2:  [100 %] 100 % (10/26 1614) Weight:  [176 lb (79.8 kg)] 176 lb (79.8 kg) (10/26 0937)             INTAKE/OUTPUT:  This shift: No intake/output data recorded.  Last 2 shifts: @IOLAST2SHIFTS @   PHYSICAL EXAM:  Constitutional:  -- Normal body habitus  -- Awake, alert, and oriented x3  Eyes:  -- Pupils equally round and reactive to light  -- No scleral icterus  Ear, nose, and throat:  -- No jugular  venous distension  Pulmonary:  -- No crackles  -- Equal breath sounds bilaterally -- Breathing non-labored at rest Cardiovascular:  -- S1, S2 present  -- No pericardial rubs Gastrointestinal:  -- Abdomen soft, nontender, and non-distended with no guarding or rebound tenderness -- Majority of patient's midline laparotomy wound is well-approximated without erythema or drainage except where few staples were removed post-operatively and ileostomy appears pink and viable with ostomy bag 1/2 full of thin yellow fluid and gas -- No abdominal masses appreciated, pulsatile or otherwise  Musculoskeletal and Integumentary:  -- Wounds or skin discoloration: None appreciated except post-surgical abdominal wounds/drains as described above (GI) -- Extremities: B/L UE and LE FROM, hands and feet warm, no edema  Neurologic:  -- Motor function: intact and symmetric -- Sensation: intact and symmetric  Labs:  CBC Latest Ref Rng & Units 07/01/2017 07/01/2017 06/22/2017  WBC 3.6 - 11.0 K/uL 12.5(H) 12.0(H) 12.5(H)  Hemoglobin 12.0 - 16.0 g/dL 10.7(L) 11.2(L)  10.6(L)  Hematocrit 35.0 - 47.0 % 31.4(L) 33.2(L) 31.8(L)  Platelets 150 - 440 K/uL 587(H) 612(H) 931(HH)   CMP Latest Ref Rng & Units 07/01/2017 07/01/2017 06/22/2017  Glucose 65 - 99 mg/dL 96 103(H) 123(H)  BUN 6 - 20 mg/dL 49(H) 50(H) 44(H)  Creatinine 0.44 - 1.00 mg/dL 1.38(H) 1.44(H) 0.97  Sodium 135 - 145 mmol/L 123(L) 125(L) 129(L)  Potassium 3.5 - 5.1 mmol/L 5.5(H) 6.0(H) 4.5  Chloride 101 - 111 mmol/L 89(L) 91(L) 92(L)  CO2 22 - 32 mmol/L 21(L) 21(L) 23  Calcium 8.9 - 10.3 mg/dL 9.4 9.8 8.8(L)  Total Protein 6.5 - 8.1 g/dL 8.4(H) 8.6(H) 8.8(H)  Total Bilirubin 0.3 - 1.2 mg/dL 0.8 0.8 0.8  Alkaline Phos 38 - 126 U/L 126 130(H) 126  AST 15 - 41 U/L 20 20 24   ALT 14 - 54 U/L 14 14 12(L)   Imaging studies:  CT Abdomen and Pelvis with Contrast (07/01/2017) - personally reviewed and discussed with patient, family, and radiologist Stable 2 cm cyst over the left lobe of the liver. Previous cholecystectomy. Biliary tree is within normal. There are multiple hepatic subcapsular fluid collections with the largest junction of the right the left lobe measuring 2.3 x 7.6 cm. These are slightly worse compared to the prior study likely present subcapsular abscesses. Slight interval worsening of a splenic  subcapsular fluid collection, likely abscess.  Assessment/Plan: (ICD-10's: E86.0, K65.1) 51 y.o. female with what appears to be profound dehydration with severe electrolyte disturbances, attributable to combination of ileostomy drainage and severely decreased oral intake due to severe uncontrolled GERD 22 Days s/p exploratory laparotomy with abdominal washout, creation of diverting ileostomy, and placement of 19 F Blake pelvic drains x 2 for colorectal anastomotic disruption 9 Days following laparoscopic sigmoid colectomy with primary EEA anastomosis, attributable at least in part to since-discontinued medication for Rheumatoid Arthritis and complicated by pertinent comorbidities including  HTN, HLD, stroke (2013), hyperthyroidism, osteoarthritis, nephrolithiasis, and a history significant for skin melanoma.   - aggressive resuscitation and correction of electrolytes  - continue antibiotics for intraperitoneal abscesses (see cultures)  - IR evaluation for possible drainage of peri-hepatic and peri-splenic abscesses  - anticipate will benefit from opiates, immodium, or similar to slow ileostomy drainage  - medical management of comorbidities including PPI for severe GERD   - pain control prn and ambulation encouraged  - DVT prophylaxis  All of the above findings and recommendations were discussed with the patient and her mom, and all of patient's and her family's questions were answered  to their expressed satisfaction.  Thank you for the opportunity to participate in this patient's care.   -- Marilynne Drivers Rosana Hoes, MD, [place]: [place] [facility] - Partnering for exceptional care. Office: [phone]

## 2017-07-01 NOTE — Telephone Encounter (Signed)
Spoke with Dr. Dahlia Byes at this time in regards to patient's lab results from this am. With her electrolytes being severely abnormal, he would like patient taken from CT scan over to the Emergency Department when her CT has been completed.   I verified with Lab Tech, Sharlyn Bologna that there was no hemolysis with specimen sent and also call made to [facility] in CT scan. She was asked to take patient to ED when CT complete, she verbalized understanding.  Dr. Dahlia Byes spoke directly with Surgeon on call, Dr. Rosana Hoes to give him report on patient.  Patient will go to ED for evaluation and admission when CT complete.

## 2017-07-01 NOTE — Progress Notes (Signed)
S/p ileostomy and drain placement for anastomotic leak Ileostomy better Still having purulent drainage from JP Taking PO, no fevers, ostomy working  PE NAD Abd: incision healing well, small 2x1 cm open wound, no infection. JPs purulent drainage. Ileostomy working  A/P Doing well persistnet purulent drainage  CT a/p r/o undrained collection She needs another round of A/Bs  Until purulent drainage clears No emergent surgical intervention F/U next week

## 2017-07-01 NOTE — ED Provider Notes (Signed)
[facility] Emergency Department Provider Note  ____________________________________________   First MD Initiated Contact with Patient 07/01/17 1400     (approximate)  I have reviewed the triage vital signs and the nursing notes.   HISTORY  Chief Complaint Abnormal Lab   HPI Rebecca Obrien is a 51 y.o. female who is sent to the emergency department by Dr. Dahlia Byes for hyperkalemia on outpatient labs. The patient has had a complex past month with sigmoid diverticulitis status post resection complicated by an anastomotic leak and subsequently has had an ileostomy placed with bilateral drains. Today she had a routine follow-up appointment that had blood work done and a CT scan. While in the scanner her blood work came back showing a decrease in her GFR along with a decrease in her sodium and an increase in her potassium to 6.0 so she was advised to come to the emergency department. She says that overall she feels relatively well. She is currently in no pain. She does feel moderate to severe anxiety that is related to her recent illness.Her symptoms are exacerbated by being in the hospital and improved when she is home.   Past Medical History:  Diagnosis Date  . Arthritis    RA  . Cancer ([place])    skin melanoma  . Collagen vascular disease (HCC)    RA  . Difficult airway   . 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 Diverticulitis of sigmoid colon   . History of kidney stones   . Hyperlipidemia   . Hypertension   . Hyperthyroidism   . Hypokalemia   . Obesity   . Rheumatoid arthritis(714.0)   . Stroke [facility]) 2013   no residual    Patient Active Problem List   Diagnosis Date Noted  . Acute kidney injury ([place]) 07/01/2017  . Large intestine anastomotic leak 06/09/2017  . Difficult intubation   . Diverticulitis of intestine with abscess 05/03/2017  . Tobacco abuse 05/02/2017  . Hypokalemia   . Hyperthyroidism   .  Difficult airway   . Diverticulitis of large intestine with abscess   . Diverticulitis 04/27/2017  . Hypocalcemia 03/02/2017  . 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 S/P total thyroidectomy 01/28/2017  . 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 Abnormal TSH 08/04/2016  . Encounter for antineoplastic chemotherapy and immunotherapy 07/13/2016  . Encounter for long-term (current) use of high-risk medication 06/23/2016  . 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 Malignant melanoma of lower leg, left ([place]) 12/03/2015  . History of stroke 12/12/2013  . Rheumatoid arthritis involving multiple sites with positive rheumatoid factor ([place]) 12/12/2013  . CVA (cerebral infarction) 07/23/2012  . Hypertension 07/23/2012    Past Surgical History:  Procedure Laterality Date  . ABDOMINAL HYSTERECTOMY    . CARPAL TUNNEL RELEASE  Bilateral  . CHOLECYSTECTOMY    . CYSTOSCOPY WITH STENT PLACEMENT Bilateral 05/31/2017   Procedure: CYSTOSCOPY WITH STENT PLACEMENT- LIGHTED STENTS;  Surgeon: Hollice Espy, MD;  Location: [facility] OR[facility];  Service: Urology;  Laterality: Bilateral;  . ILEOSTOMY N/A 06/08/2017   Procedure: ILEOSTOMY;  Surgeon: Vickie Epley, MD;  Location: [place] [facility];  Service: General;  Laterality: N/A;  . LAPAROSCOPIC SIGMOID COLECTOMY N/A 05/31/2017   Procedure: LAPAROSCOPIC SIGMOID COLECTOMY;  Surgeon: Clayburn Pert, MD;  Location: [facility];  Service: General;  Laterality: N/A;  . LAPAROTOMY N/A 06/08/2017   Procedure: EXPLORATORY LAPAROTOMY;  Surgeon: Vickie Epley, MD;  Location: [facility];  Service: General;  Laterality: N/A;  . TEE WITHOUT CARDIOVERSION  07/26/2012   Procedure: TRANSESOPHAGEAL ECHOCARDIOGRAM (TEE);  Surgeon: Thayer Headings, MD;  Location: [place];  Service: Cardiovascular;  Laterality: N/A;  . TONSILLECTOMY    . TOTAL THYROIDECTOMY      Prior to Admission medications   Medication Sig Start Date End Date Taking? Authorizing Provider    hydrochlorothiazide (MICROZIDE) 12.5 MG capsule Take 12.5 mg by mouth daily.  04/20/17  Yes [provider]  levothyroxine (SYNTHROID, LEVOTHROID) 112 MCG tablet Take 112 mcg by mouth daily before breakfast.   Yes [provider]  potassium chloride (K-DUR) 10 MEQ tablet Take 2 tablets by mouth 2 (two) times daily with a meal.  03/02/17 03/02/18 Yes [provider]  acetaminophen (TYLENOL) 650 MG CR tablet Take 1,300 mg by mouth every 8 (eight) hours as needed for pain.    [provider]  ciprofloxacin (CIPRO) 500 MG tablet Take 1 tablet (500 mg total) by mouth 2 (two) times daily. 07/01/17   Pabon, Diego F, MD  HYDROcodone-acetaminophen (NORCO/VICODIN) 5-325 MG tablet Take 1-2 tablets by mouth every 6 (six) hours as needed for moderate pain. 06/20/17   Pabon, [place], MD  leflunomide (ARAVA) 20 MG tablet Take 20 mg by mouth daily.  04/08/17 10/05/17  [provider]  meloxicam (MOBIC) 15 MG tablet Take 15 mg by mouth daily.  02/04/17   [provider]  metroNIDAZOLE (FLAGYL) 500 MG tablet Take 1 tablet (500 mg total) by mouth 3 (three) times daily. 07/01/17   Pabon, [place], MD  nystatin (MYCOSTATIN/NYSTOP) powder Apply topically 4 (four) times daily. 06/23/17   Clayburn Pert, MD  pantoprazole (PROTONIX) 40 MG tablet Take 1 tablet (40 mg total) by mouth 2 (two) times daily. 07/01/17   Pabon, [place], MD  Tofacitinib Citrate (XELJANZ XR) 11 MG TB24 Take 1 tablet by mouth daily.    [provider]    Allergies Adhesive [tape] and Latex  Family History  Problem Relation Age of Onset  . Nephrolithiasis Mother   . Hypertension Mother   . Hypertension Father   . Heart failure Father   . Cardiomyopathy Brother     Social History Social History  Substance Use Topics  . Smoking status: Current Every Day Smoker    Packs/day: 0.25    Years: 23.00    Types: Cigarettes    Last attempt to quit: 07/23/2012  . Smokeless tobacco: Never  Used  . Alcohol use No    Review of Systems Constitutional: No fever/chills Eyes: No visual changes. ENT: No sore throat. Cardiovascular: Denies chest pain. Respiratory: Denies shortness of breath. Gastrointestinal: No abdominal pain.  No nausea, no vomiting.  No diarrhea.  No constipation. Genitourinary: Negative for dysuria. Musculoskeletal: Negative for back pain. Skin: Negative for rash. Neurological: Negative for headaches, focal weakness or numbness.   ____________________________________________   PHYSICAL EXAM:  VITAL SIGNS: ED Triage Vitals  Enc Vitals Group     BP      Pulse      Resp      Temp      Temp src      SpO2      Weight      Height      Head Circumference      Peak Flow      Pain Score      Pain Loc      Pain Edu?      Excl. in [place]?  Constitutional: Alert and oriented x4 somewhat anxious appearing but nontoxic no diaphoresis speaks in full clear sentences Eyes: PERRL EOMI. Head: Atraumatic. Nose: No congestion/rhinnorhea. Mouth/Throat: No trismus Neck: No stridor.   Cardiovascular: Normal rate, regular rhythm. Grossly normal heart sounds.  Good peripheral circulation. Respiratory: Normal respiratory effort.  No retractions. Lungs CTAB and moving good air Gastrointestinal: Soft abdomen.  Ileostomy pink patent and productive.  Bilateral JP drains each with some purulent material.  Abdomen is not peritoneal Musculoskeletal: No lower extremity edema   Neurologic:  Normal speech and language. No gross focal neurologic deficits are appreciated. Skin:  Skin is warm, dry and intact. No rash noted. Psychiatric: Mood and affect are normal. Speech and behavior are normal.    ____________________________________________   DIFFERENTIAL includes but not limited to  Dehydration, hemolysis, acute kidney injury, sepsis, abscess ____________________________________________   LABS (all labs ordered are listed, but only abnormal results are  displayed)  Labs Reviewed  COMPREHENSIVE METABOLIC PANEL - Abnormal; Notable for the following:       Result Value   Sodium 123 (*)    Potassium 5.5 (*)    Chloride 89 (*)    CO2 21 (*)    BUN 49 (*)    Creatinine, Ser 1.38 (*)    Total Protein 8.4 (*)    Albumin 3.3 (*)    GFR calc non Af Amer 43 (*)    GFR calc Af Amer 50 (*)    All other components within normal limits  CBC WITH DIFFERENTIAL/PLATELET - Abnormal; Notable for the following:    WBC 12.5 (*)    Hemoglobin 10.7 (*)    HCT 31.4 (*)    MCV 78.2 (*)    RDW 15.6 (*)    Platelets 587 (*)    Neutro Abs 9.1 (*)    All other components within normal limits  GLUCOSE, CAPILLARY  CORTISOL    Blood work reviewed and interpreted by me shows elevated potassium with low sodium.    Hypochloremic as well along with acute kidney injury concerning for dehydration __________________________________________  EKG  ED ECG REPORT I, Darel Hong, the attending physician, personally viewed and interpreted this ECG.  Date: 07/01/2017 EKG Time:  Rate: 85 Rhythm: normal sinus rhythm QRS Axis: normal Intervals: normal ST/T Wave abnormalities: Left ventricular hypertrophy with repolarization abnormalities Narrative Interpretation: no evidence of acute ischemia.  No signs of hyperkalemia  ____________________________________________  RADIOLOGY  CT scan abdomen pelvis reviewed by me is complicated and does appear to show new abscesses ____________________________________________   PROCEDURES  Procedure(s) performed: no  Procedures  Critical Care performed: yes  CRITICAL CARE Performed by: Darel Hong   Total critical care time: 30 minutes  Critical care time was exclusive of separately billable procedures and treating other patients.  Critical care was necessary to treat or prevent imminent or life-threatening deterioration.  Critical care was time spent personally by me on the following activities:  development of treatment plan with patient and/or surrogate as well as nursing, discussions with consultants, evaluation of patient's response to treatment, examination of patient, obtaining history from patient or surrogate, ordering and performing treatments and interventions, ordering and review of laboratory studies, ordering and review of radiographic studies, pulse oximetry and re-evaluation of patient's condition.   Observation: no ____________________________________________   INITIAL IMPRESSION / ASSESSMENT AND PLAN / ED COURSE  Pertinent labs & imaging results that were available during my care of the patient were reviewed by me and considered in my medical decision  making (see chart for details).  The patient arrives hemodynamically stable with a normal EKG, although with acute kidney injury and elevated potassium which is concerning.  She will be treated with insulin, D50, as well as 15 mg of albuterol now to acutely lower her potassium.  I will defer Kayexalate given her ileostomy output and also defer Lasix given her dehydration.     ----------------------------------------- 2:10 PM on 07/01/2017 -----------------------------------------  I discussed the case with on-call general surgeon Dr. Rosana Hoes will kindly consult on the patient. I discussed the case with Dr. Benjie Karvonen of the hospitalist service who has graciously agreed to admit the patient to her service. ____________________________________________   FINAL CLINICAL IMPRESSION(S) / ED DIAGNOSES  Final diagnoses:  Dehydration  Hyperkalemia  Hyponatremia      NEW MEDICATIONS STARTED DURING THIS VISIT:  New Prescriptions   No medications on file     Note:  This document was prepared using Dragon voice recognition software and may include unintentional dictation errors.     Darel Hong, MD 07/01/17 1512

## 2017-07-01 NOTE — Progress Notes (Signed)
Pt requesting ativan or something for anxiety.  Paged Dr Burt Knack.  He stated that she has liver abscesses and he would not be ordering her anything tonight.  This can be evaluated in the morning to see if there is something for her.

## 2017-07-01 NOTE — H&P (Signed)
Rebecca Obrien NAME: Rebecca Obrien    MR#:  [id number]  DATE OF BIRTH:  09-25-1965  DATE OF ADMISSION:  07/01/2017  PRIMARY CARE PHYSICIAN: Marguerita Merles, MD   REQUESTING/REFERRING PHYSICIAN: dr Forest Gleason  CHIEF COMPLAINT:   Abnormal labs HISTORY OF PRESENT ILLNESS:  Rebecca Obrien  is a 51 y.o. female with a known history of diverticulitis with perforation and pelvic abscess and loop ileostomy creation secondary to anastomotic breakdown from her previous colon resection who presented to the ED after receiving a call for abnormal labs. Patient had a potassium of 6.0 and sodium level of 125. She was seen earlier today for routine follow-up with Dr. Dahlia Byes. Routine labs were drawn as well as a CT scan of the abdomen at his office.  She continues to have purulent drainage from her trach JP drain and increased output from ileostomy.  She denies abdominal pain, nausea or vomiting. She is tolerating her diet well. She denies any fevers or chills.  PAST MEDICAL HISTORY:   Past Medical History:  Diagnosis Date  . Arthritis    RA  . Cancer ([employer])    skin melanoma  . Collagen vascular disease (HCC)    RA  . Difficult airway   . 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 Diverticulitis of sigmoid colon   . History of kidney stones   . Hyperlipidemia   . Hypertension   . Hyperthyroidism   . Hypokalemia   . Obesity   . Rheumatoid arthritis(714.0)   . Stroke [facility]) 2013   no residual    PAST SURGICAL HISTORY:   Past Surgical History:  Procedure Laterality Date  . ABDOMINAL HYSTERECTOMY    . CARPAL TUNNEL RELEASE  Bilateral  . CHOLECYSTECTOMY    . CYSTOSCOPY WITH STENT PLACEMENT Bilateral 05/31/2017   Procedure: CYSTOSCOPY WITH STENT PLACEMENT- LIGHTED STENTS;  Surgeon: Hollice Espy, MD;  Location: [facility];  Service: Urology;  Laterality: Bilateral;  . ILEOSTOMY N/A 06/08/2017   Procedure:  ILEOSTOMY;  Surgeon: Vickie Epley, MD;  Location: [place] [facility];  Service: General;  Laterality: N/A;  . LAPAROSCOPIC SIGMOID COLECTOMY N/A 05/31/2017   Procedure: LAPAROSCOPIC SIGMOID COLECTOMY;  Surgeon: Clayburn Pert, MD;  Location: [facility];  Service: General;  Laterality: N/A;  . LAPAROTOMY N/A 06/08/2017   Procedure: EXPLORATORY LAPAROTOMY;  Surgeon: Vickie Epley, MD;  Location: [facility];  Service: General;  Laterality: N/A;  . TEE WITHOUT CARDIOVERSION  07/26/2012   Procedure: TRANSESOPHAGEAL ECHOCARDIOGRAM (TEE);  Surgeon: Thayer Headings, MD;  Location: [place];  Service: Cardiovascular;  Laterality: N/A;  . TONSILLECTOMY    . TOTAL THYROIDECTOMY      SOCIAL HISTORY:   Social History  Substance Use Topics  . Smoking status: Current Every Day Smoker    Packs/day: 0.25    Years: 23.00    Types: Cigarettes    Last attempt to quit: 07/23/2012  . Smokeless tobacco: Never Used  . Alcohol use No    FAMILY HISTORY:   Family History  Problem Relation Age of Onset  . Nephrolithiasis Mother   . Hypertension Mother   . Hypertension Father   . Heart failure Father   . Cardiomyopathy Brother     DRUG ALLERGIES:   Allergies  Allergen Reactions  . Adhesive [Tape]     Skin breakdown/dermatitis with prolonged use   . Latex Dermatitis    With prolonged use  NOT LATEX ALLERGIC.  Patient has problem with adhesive, not latex    REVIEW OF SYSTEMS:   Review of Systems  Constitutional: Negative.  Negative for chills, fever and malaise/fatigue.  HENT: Negative.  Negative for ear discharge, ear pain, hearing loss, nosebleeds and sore throat.   Eyes: Negative.  Negative for blurred vision and pain.  Respiratory: Negative.  Negative for cough, hemoptysis, shortness of breath and wheezing.   Cardiovascular: Negative.  Negative for chest pain, palpitations and leg swelling.  Gastrointestinal: Negative.  Negative for abdominal pain, blood in stool, diarrhea, nausea and  vomiting.  Genitourinary: Negative.  Negative for dysuria.  Musculoskeletal: Negative.  Negative for back pain.  Skin: Negative.   Neurological: Negative for dizziness, tremors, speech change, focal weakness, seizures and headaches.  Endo/Heme/Allergies: Negative.  Does not bruise/bleed easily.  Psychiatric/Behavioral: Negative.  Negative for depression, hallucinations and suicidal ideas.    MEDICATIONS AT HOME:   Prior to Admission medications   Medication Sig Start Date End Date Taking? Authorizing Provider  hydrochlorothiazide (MICROZIDE) 12.5 MG capsule Take 12.5 mg by mouth daily.  04/20/17  Yes [provider]  levothyroxine (SYNTHROID, LEVOTHROID) 112 MCG tablet Take 112 mcg by mouth daily before breakfast.   Yes [provider]  potassium chloride (K-DUR) 10 MEQ tablet Take 2 tablets by mouth 2 (two) times daily with a meal.  03/02/17 03/02/18 Yes [provider]  acetaminophen (TYLENOL) 650 MG CR tablet Take 1,300 mg by mouth every 8 (eight) hours as needed for pain.    [provider]  ciprofloxacin (CIPRO) 500 MG tablet Take 1 tablet (500 mg total) by mouth 2 (two) times daily. 07/01/17   Pabon, Diego F, MD  HYDROcodone-acetaminophen (NORCO/VICODIN) 5-325 MG tablet Take 1-2 tablets by mouth every 6 (six) hours as needed for moderate pain. 06/20/17   Pabon, [place], MD  leflunomide (ARAVA) 20 MG tablet Take 20 mg by mouth daily.  04/08/17 10/05/17  [provider]  meloxicam (MOBIC) 15 MG tablet Take 15 mg by mouth daily.  02/04/17   [provider]  metroNIDAZOLE (FLAGYL) 500 MG tablet Take 1 tablet (500 mg total) by mouth 3 (three) times daily. 07/01/17   Pabon, [place], MD  nystatin (MYCOSTATIN/NYSTOP) powder Apply topically 4 (four) times daily. 06/23/17   Clayburn Pert, MD  pantoprazole (PROTONIX) 40 MG tablet Take 1 tablet (40 mg total) by mouth 2 (two) times daily. 07/01/17   Pabon, [place], MD  Tofacitinib Citrate (XELJANZ XR)  11 MG TB24 Take 1 tablet by mouth daily.    [provider]      VITAL SIGNS:  Blood pressure 110/89, resp. rate 18, SpO2 100 %.  PHYSICAL EXAMINATION:   Physical Exam  Constitutional: She is oriented to person, place, and time and well-developed, well-nourished, and in no distress. No distress.  HENT:  Head: Normocephalic.  Eyes: No scleral icterus.  Neck: Normal range of motion. Neck supple. No JVD present. No tracheal deviation present.  Cardiovascular: Normal rate, regular rhythm and normal heart sounds.  Exam reveals no gallop and no friction rub.   No murmur heard. Pulmonary/Chest: Effort normal and breath sounds normal. No respiratory distress. She has no wheezes. She has no rales. She exhibits no tenderness.  Abdominal: Soft. Bowel sounds are normal. She exhibits no distension and no mass. There is no tenderness. There is no rebound and no guarding.  Surgical site is clean and intact. She has 2 JP drains placed. She has ileostomy which  is draining.  Musculoskeletal: Normal range of motion. She exhibits no edema.  Neurological: She is alert and oriented to person, place, and time.  Skin: Skin is warm. No rash noted. No erythema.  Psychiatric: Affect and judgment normal.      LABORATORY PANEL:   CBC  Recent Labs Lab 07/01/17 1410  WBC 12.5*  HGB 10.7*  HCT 31.4*  PLT 587*   ------------------------------------------------------------------------------------------------------------------  Chemistries   Recent Labs Lab 07/01/17 1101  NA 125*  K 6.0*  CL 91*  CO2 21*  GLUCOSE 103*  BUN 50*  CREATININE 1.44*  CALCIUM 9.8  AST 20  ALT 14  ALKPHOS 130*  BILITOT 0.8   ------------------------------------------------------------------------------------------------------------------  Cardiac Enzymes No results for input(s): TROPONINI in the last 168  hours. ------------------------------------------------------------------------------------------------------------------  RADIOLOGY:  Ct Abdomen Pelvis W Contrast  Result Date: 07/01/2017 CLINICAL DATA:  History of diverticulitis 2 weeks ago. Follow-up colostomy bag. Two drains. EXAM: CT ABDOMEN AND PELVIS WITH CONTRAST TECHNIQUE: Multidetector CT imaging of the abdomen and pelvis was performed using the standard protocol following bolus administration of intravenous contrast. CONTRAST:  52mL ISOVUE-300 IOPAMIDOL (ISOVUE-300) INJECTION 61% COMPARISON:  06/16/2017 and 06/08/2017 FINDINGS: Lower chest: Within normal. Hepatobiliary: Stable 2 cm cyst over the left lobe of the liver. Previous cholecystectomy. Biliary tree is within normal. There are multiple hepatic subcapsular fluid collections with the largest junction of the right the left lobe measuring 2.3 x 7.6 cm. These are slightly worse compared to the prior study likely present subcapsular abscesses. Pancreas: Within normal. Spleen: Slight interval worsening of a subcapsular fluid collection likely abscess. Adrenals/Urinary Tract: Adrenal glands are normal. Kidneys are normal in size without hydronephrosis or nephrolithiasis. 1.3 cm cyst over the mid to lower pole left kidney unchanged. Ureters are within normal. Bladder is within normal. Stomach/Bowel: Stomach is normal evidence patient's ileostomy site over the right lower quadrant unchanged and within normal. Terminal ileum is within normal. Appendix is not visualized. Surgical suture line over the rectosigmoid junction with subtle adjacent foci of air improved. The transverse, descending sigmoid colon are decompressed. Vascular/Lymphatic: Mild calcified plaque over the abdominal aorta and iliac arteries. No adenopathy. Reproductive: Previous hysterectomy. Other: Fluid collection containing small focus of air over the left paracolic gutter adjacent the descending colon with interval decrease in size  measuring 2.4 x 2.9 cm (previously 3.3 x 4.2 cm). Bilateral lower abdominal/ pelvic drains are present without significant change. Collection of air and fluid with air-fluid level over the midline lower abdominal wall/ pelvic incision measuring 3.1 x 5 cm which may be infected versus noninfected postoperative collection. Musculoskeletal: Unchanged. IMPRESSION: History of diverticulitis post partial sigmoid colectomy with right lower quadrant ileostomy. Tiny extraluminal foci of air adjacent the rectosigmoid anastomosis improved. Improved air and fluid collection adjacent the distal descending colon in the left pericolic gutter measuring 2.4 x 2.9 cm likely a small abscess. Several hepatic subcapsular and splenic subcapsular fluid collections with slight interval worsening likely small abscesses. Two percutaneous drains with tips over the pelvis unchanged. Slight worsening air-fluid collection over the midline lower anterior abdominal wall/ pelvic incision measuring 3.1 x 5 cm which may be infected versus noninfected postop collection. Stable 2 cm hepatic cyst. 1.3 cm left renal cyst unchanged. Aortic Atherosclerosis (ICD10-I70.0). These results were called by telephone at the time of interpretation on 07/01/2017 at 2:14 pm to Dr. Rosana Hoes, who verbally acknowledged these results. Electronically Signed   By: Marin Olp M.D.   On: 07/01/2017 14:15    EKG:  none  IMPRESSION AND PLAN:   51 year old female with history of diverticulitis status post perforation and now with ileostomy who presented for outpatient routine follow-up and found to have hyperkalemia and hyponatremia.  1. Hyponatremia: This is from dehydration from increased ileostomy output Start IV fluids Repeat BMP for a.m. Hold hydrochlorothiazide  2. Hyperkalemia from acute kidney injury and potassium supplements Stop potassium supplements Hyperkalemia treated with insulin, dextrose and albuterol Repeat potassium this afternoon  3. Acute  kidney injury in the setting of dehydration from increased ileostomy output Continue IV fluids Repeat BMP in a.m. Discontinue nephrotoxic medications  4. Status post ileostomy and drain placement with CT results as stated above Surgery consultation requested and case discussed with Dr. Rosana Hoes Dietitian consult Hayden Pedro requested as well.  5. Essential hypertension: HCTZ discontinued for now due to hyponatremia When necessary hydralazine ordered.  6. GERD: Continue PPI   All the records are reviewed and case discussed with ED provider. Management plans discussed with the patient and she is in agreement  CODE STATUS: full  TOTAL TIME TAKING CARE OF THIS PATIENT: 42 minutes.    Sibel Khurana M.D on 07/01/2017 at 2:36 PM  Between 7am to 6pm - Pager - [phone]  After 6pm go to [email or web address] - password [email or web address] [place] [facility]  Office  [phone]  CC: Primary care physician; Marguerita Merles, MD

## 2017-07-01 NOTE — Patient Instructions (Addendum)
Pick up your prescriptions at the pharmacy and begin taking today.  Continue to empty drains and strip drains twice daily. Continue packing small area at the bottom of the midline incision as you have been daily.   We have ordered at CT Scan and Labs to be completed today at the medical mall.  Directions to Medical Mall: When leaving our office, go right. Go all of the way down to the very end of the hallway. You will have a purple wall in front of you. You will now have a tunnel to the hospital on your left hand side. Go through this tunnel and the elevators will be on your left. Go down to the 1st floor and take a slight left. The very first desk on the right hand side is the registration desk.  We will have you follow-up in the office as scheduled below. If you have any questions prior to your next appointment, please let us know.

## 2017-07-01 NOTE — ED Triage Notes (Addendum)
Pt presents from CT after receiving call from PCP that pt labs were high with a K+ 6.0, Na+125, increase Creatine. Pt had previous sx for perf r/t diverticulitis. Pt has multiple incision, 2 jp drains and a ileotomy. Output is wnl for ostomy and JP Drains were emptied at doctors office this morning. Content in drains appear to be white cloudy substance. Pt is a/o and has no complaints at this time. EDP at bedside.

## 2017-07-02 DIAGNOSIS — L8990 Pressure ulcer of unspecified site, unspecified stage: Secondary | ICD-10-CM | POA: Insufficient documentation

## 2017-07-02 DIAGNOSIS — K651 Peritoneal abscess: Secondary | ICD-10-CM

## 2017-07-02 DIAGNOSIS — E860 Dehydration: Secondary | ICD-10-CM

## 2017-07-02 LAB — BASIC METABOLIC PANEL
ANION GAP: 8 (ref 5–15)
BUN: 40 mg/dL — ABNORMAL HIGH (ref 6–20)
CALCIUM: 8.7 mg/dL — AB (ref 8.9–10.3)
CO2: 22 mmol/L (ref 22–32)
Chloride: 97 mmol/L — ABNORMAL LOW (ref 101–111)
Creatinine, Ser: 1.38 mg/dL — ABNORMAL HIGH (ref 0.44–1.00)
GFR, EST AFRICAN AMERICAN: 50 mL/min — AB (ref >60)
GFR, EST NON AFRICAN AMERICAN: 43 mL/min — AB (ref >60)
Glucose, Bld: 99 mg/dL (ref 65–99)
Potassium: 4.5 mmol/L (ref 3.5–5.1)
Sodium: 127 mmol/L — ABNORMAL LOW (ref 135–145)

## 2017-07-02 LAB — CBC
HCT: 29.3 % — ABNORMAL LOW (ref 35.0–47.0)
HEMOGLOBIN: 10.1 g/dL — AB (ref 12.0–16.0)
MCH: 26.8 pg (ref 26.0–34.0)
MCHC: 34.3 g/dL (ref 32.0–36.0)
MCV: 78.1 fL — ABNORMAL LOW (ref 80.0–100.0)
Platelets: 503 10*3/uL — ABNORMAL HIGH (ref 150–440)
RBC: 3.76 MIL/uL — AB (ref 3.80–5.20)
RDW: 15.6 % — ABNORMAL HIGH (ref 11.5–14.5)
WBC: 10.5 10*3/uL (ref 3.6–11.0)

## 2017-07-02 LAB — GLUCOSE, CAPILLARY: GLUCOSE-CAPILLARY: 110 mg/dL — AB (ref 65–99)

## 2017-07-02 LAB — CORTISOL: CORTISOL PLASMA: 17.4 ug/dL

## 2017-07-02 MED ORDER — LOPERAMIDE HCL 2 MG PO CAPS
2.0000 mg | ORAL_CAPSULE | Freq: Two times a day (BID) | ORAL | Status: DC
  Administered 2017-07-02 (×2): 2 mg via ORAL
  Filled 2017-07-02 (×2): qty 1

## 2017-07-02 MED ORDER — LORAZEPAM 0.5 MG PO TABS
0.5000 mg | ORAL_TABLET | Freq: Four times a day (QID) | ORAL | Status: DC | PRN
  Administered 2017-07-02 (×3): 0.5 mg via ORAL
  Filled 2017-07-02 (×4): qty 1

## 2017-07-02 NOTE — Progress Notes (Addendum)
[facility] Hospital Day(s): 1.   Post op day(s):  Marland Kitchen   Interval History: Patient seen and examined, no acute events or new complaints overnight. Patient reports she feels overall much better today, though continues to complain of severe heartburn/reflux and significant ileostomy drainage. She has otherwise been tolerating her diet and denies any abdominal pain, N/V, fever/chills, CP, or SOB.  Review of Systems:  Constitutional: denies fever, chills  HEENT: denies cough or congestion  Respiratory: denies any shortness of breath  Cardiovascular: denies chest pain or palpitations  Gastrointestinal: abdominal pain, N/V, and bowel function as per interval history Genitourinary: denies burning with urination or urinary frequency Musculoskeletal: denies pain, decreased motor or sensation Integumentary: denies any other rashes or skin discolorations Neurological: denies HA or vision/hearing changes   Vital signs in last 24 hours: [min-max] current  Temp:  [97.4 F (36.3 C)-98.7 F (37.1 C)] 97.7 F (36.5 C) (10/27 0528) Pulse Rate:  [75-103] 92 (10/27 0529) Resp:  [14-18] 16 (10/27 0528) BP: (95-118)/(59-89) 98/62 (10/27 0528) SpO2:  [80 %-100 %] 99 % (10/27 0529) Weight:  [148 lb 12.8 oz (67.5 kg)-176 lb (79.8 kg)] 148 lb 12.8 oz (67.5 kg) (10/27 0617)     Height: 5\' 5"  (165.1 cm) Weight: 148 lb 12.8 oz (67.5 kg) BMI (Calculated): 24.76   Intake/Output this shift:  No intake/output data recorded.   Intake/Output last 2 shifts:  @IOLAST2SHIFTS @   Physical Exam:  Constitutional: alert, cooperative and no distress  HENT: normocephalic without obvious abnormality  Eyes: PERRL, EOM's grossly intact and symmetric  Neuro: CN II - XII grossly intact and symmetric without deficit  Respiratory: breathing non-labored at rest  Cardiovascular: regular rate and sinus rhythm  Gastrointestinal: soft, non-tender, and non-distended with majority of patient's midline laparotomy wound  well-approximated without erythema or drainage except open wound with pink healthy granulation tissue packed with small amount of gauze where a few staples were removed post-operatively, ileostomy appears pink and viable with ostomy bag 1/2 full of thin yellow fluid and gas Musculoskeletal: UE and LE FROM, no edema or wounds, motor and sensation grossly intact, NT  Labs:  CBC Latest Ref Rng & Units 07/02/2017 07/01/2017 07/01/2017  WBC 3.6 - 11.0 K/uL 10.5 12.5(H) 12.0(H)  Hemoglobin 12.0 - 16.0 g/dL 10.1(L) 10.7(L) 11.2(L)  Hematocrit 35.0 - 47.0 % 29.3(L) 31.4(L) 33.2(L)  Platelets 150 - 440 K/uL 503(H) 587(H) 612(H)   CMP Latest Ref Rng & Units 07/02/2017 07/01/2017 07/01/2017  Glucose 65 - 99 mg/dL 99 - 96  BUN 6 - 20 mg/dL 40(H) - 49(H)  Creatinine 0.44 - 1.00 mg/dL 1.38(H) - 1.38(H)  Sodium 135 - 145 mmol/L 127(L) - 123(L)  Potassium 3.5 - 5.1 mmol/L 4.5 4.4 5.5(H)  Chloride 101 - 111 mmol/L 97(L) - 89(L)  CO2 22 - 32 mmol/L 22 - 21(L)  Calcium 8.9 - 10.3 mg/dL 8.7(L) - 9.4  Total Protein 6.5 - 8.1 g/dL - - 8.4(H)  Total Bilirubin 0.3 - 1.2 mg/dL - - 0.8  Alkaline Phos 38 - 126 U/L - - 126  AST 15 - 41 U/L - - 20  ALT 14 - 54 U/L - - 14   Imaging studies: No new pertinent imaging studies   Assessment/Plan: (ICD-10's: E86.0, K65.1) 51 y.o. female with what appears to be profound dehydration with severe electrolyte disturbances, attributable to combination of ileostomy drainage and severely decreased oral intake due to severe uncontrolled GERD 22 Days s/p exploratory laparotomy with abdominal washout, creation of diverting  ileostomy, and placement of 19 F Blake pelvic drains x 2 for colorectal anastomotic disruption 9 Days following laparoscopic sigmoid colectomy with primary EEA anastomosis, attributable at least in part to since-discontinued medication for Rheumatoid Arthritis and complicated by pertinent comorbidities including HTN, HLD, stroke (2013), hyperthyroidism,  osteoarthritis, nephrolithiasis, and a history significant for skin melanoma.              - aggressive resuscitation and correction of electrolytes             - continue antibiotics for intraperitoneal abscesses (see cultures)             - IR evaluation for possible drainage of peri-hepatic and peri-splenic abscesses             - anticipate will benefit medication(s) to slow ileostomy drainage (Imodium, Lomotil, tincture of opium/opiates)             - PPI + medical management of comorbidities otherwise             - pain control prn and ambulation encouraged             - DVT prophylaxis  All of the above findings and recommendations were discussed with the patient and her mom, and all of patient's and her family's questions were answered to their expressed satisfaction.  Thank you for the opportunity to participate in this patient's care.   -- Marilynne Drivers Rosana Hoes, MD, [place]: [facility] - Partnering for exceptional care. Office: [phone]

## 2017-07-02 NOTE — Progress Notes (Signed)
[facility] [place] NAME: Rebecca Obrien    MR#:  [id number]  DATE OF BIRTH:  05-02-1966  SUBJECTIVE:  Patient with heartburn this am  Still with increased ileostomy output  REVIEW OF SYSTEMS:    Review of Systems  Constitutional: Negative for fever, chills weight loss HENT: Negative for ear pain, nosebleeds, congestion, facial swelling, rhinorrhea, neck pain, neck stiffness and ear discharge.   Respiratory: Negative for cough, shortness of breath, wheezing  Cardiovascular: Negative for chest pain, palpitations and leg swelling.  Gastrointestinal:++ for heartburn, NOabdominal pain, vomiting, diarrhea or consitpation Genitourinary: Negative for dysuria, urgency, frequency, hematuria Musculoskeletal: Negative for back pain or joint pain Neurological: Negative for dizziness, seizures, syncope, focal weakness,  numbness and headaches.  Hematological: Does not bruise/bleed easily.  Psychiatric/Behavioral: Negative for hallucinations, confusion, dysphoric mood +anxiety    Tolerating Diet: yes      DRUG ALLERGIES:   Allergies  Allergen Reactions  . Adhesive [Tape]     Skin breakdown/dermatitis with prolonged use   . Latex Dermatitis    With prolonged use  NOT LATEX ALLERGIC.  Patient has problem with adhesive, not latex    VITALS:  Blood pressure 98/62, pulse 92, temperature 97.7 F (36.5 C), temperature source Oral, resp. rate 16, height 5\' 5"  (1.651 m), weight 67.5 kg (148 lb 12.8 oz), SpO2 99 %.  PHYSICAL EXAMINATION:  Constitutional: Appears well-developed and well-nourished. No distress. HENT: Normocephalic. Marland Kitchen Oropharynx is clear and moist.  Eyes: Conjunctivae and EOM are normal. PERRLA, no scleral icterus.  Neck: Normal ROM. Neck supple. No JVD. No tracheal deviation. CVS: RRR, S1/S2 +, no murmurs, no gallops, no carotid bruit.  Pulmonary: Effort and breath sounds normal, no stridor, rhonchi, wheezes, rales.   Abdominal: Soft. BS +,  no distension, tenderness, rebound or guarding.  +Ileostomy right Healed incision site Musculoskeletal: Normal range of motion. No edema and no tenderness.  Neuro: Alert. CN 2-12 grossly intact. No focal deficits. Skin: Skin is warm and dry. No rash noted. Psychiatric: Normal mood and affect.      LABORATORY PANEL:   CBC  Recent Labs Lab 07/02/17 0407  WBC 10.5  HGB 10.1*  HCT 29.3*  PLT 503*   ------------------------------------------------------------------------------------------------------------------  Chemistries   Recent Labs Lab 07/01/17 1410  07/02/17 0407  NA 123*  --  127*  K 5.5*  < > 4.5  CL 89*  --  97*  CO2 21*  --  22  GLUCOSE 96  --  99  BUN 49*  --  40*  CREATININE 1.38*  --  1.38*  CALCIUM 9.4  --  8.7*  AST 20  --   --   ALT 14  --   --   ALKPHOS 126  --   --   BILITOT 0.8  --   --   < > = values in this interval not displayed. ------------------------------------------------------------------------------------------------------------------  Cardiac Enzymes No results for input(s): TROPONINI in the last 168 hours. ------------------------------------------------------------------------------------------------------------------  RADIOLOGY:  Ct Abdomen Pelvis W Contrast  Result Date: 07/01/2017 CLINICAL DATA:  History of diverticulitis 2 weeks ago. Follow-up colostomy bag. Two drains. EXAM: CT ABDOMEN AND PELVIS WITH CONTRAST TECHNIQUE: Multidetector CT imaging of the abdomen and pelvis was performed using the standard protocol following bolus administration of intravenous contrast. CONTRAST:  39mL ISOVUE-300 IOPAMIDOL (ISOVUE-300) INJECTION 61% COMPARISON:  06/16/2017 and 06/08/2017 FINDINGS: Lower chest: Within normal. Hepatobiliary: Stable 2 cm cyst over the left lobe of the liver.  Previous cholecystectomy. Biliary tree is within normal. There are multiple hepatic subcapsular fluid collections with the largest  junction of the right the left lobe measuring 2.3 x 7.6 cm. These are slightly worse compared to the prior study likely present subcapsular abscesses. Pancreas: Within normal. Spleen: Slight interval worsening of a subcapsular fluid collection likely abscess. Adrenals/Urinary Tract: Adrenal glands are normal. Kidneys are normal in size without hydronephrosis or nephrolithiasis. 1.3 cm cyst over the mid to lower pole left kidney unchanged. Ureters are within normal. Bladder is within normal. Stomach/Bowel: Stomach is normal evidence patient's ileostomy site over the right lower quadrant unchanged and within normal. Terminal ileum is within normal. Appendix is not visualized. Surgical suture line over the rectosigmoid junction with subtle adjacent foci of air improved. The transverse, descending sigmoid colon are decompressed. Vascular/Lymphatic: Mild calcified plaque over the abdominal aorta and iliac arteries. No adenopathy. Reproductive: Previous hysterectomy. Other: Fluid collection containing small focus of air over the left paracolic gutter adjacent the descending colon with interval decrease in size measuring 2.4 x 2.9 cm (previously 3.3 x 4.2 cm). Bilateral lower abdominal/ pelvic drains are present without significant change. Collection of air and fluid with air-fluid level over the midline lower abdominal wall/ pelvic incision measuring 3.1 x 5 cm which may be infected versus noninfected postoperative collection. Musculoskeletal: Unchanged. IMPRESSION: History of diverticulitis post partial sigmoid colectomy with right lower quadrant ileostomy. Tiny extraluminal foci of air adjacent the rectosigmoid anastomosis improved. Improved air and fluid collection adjacent the distal descending colon in the left pericolic gutter measuring 2.4 x 2.9 cm likely a small abscess. Several hepatic subcapsular and splenic subcapsular fluid collections with slight interval worsening likely small abscesses. Two percutaneous  drains with tips over the pelvis unchanged. Slight worsening air-fluid collection over the midline lower anterior abdominal wall/ pelvic incision measuring 3.1 x 5 cm which may be infected versus noninfected postop collection. Stable 2 cm hepatic cyst. 1.3 cm left renal cyst unchanged. Aortic Atherosclerosis (ICD10-I70.0). These results were called by telephone at the time of interpretation on 07/01/2017 at 2:14 pm to Dr. Rosana Hoes, who verbally acknowledged these results. Electronically Signed   By: Marin Olp M.D.   On: 07/01/2017 14:15     ASSESSMENT AND PLAN:   51 year old female with history of diverticulitis status post perforation and now with ileostomy who presented for outpatient routine follow-up and found to have hyperkalemia and hyponatremia.  1. Hyponatremia: This is from dehydration from increased ileostomy output Increase IV fluids Repeat BMP for a.m. Continue to hold hydrochlorothiazide  2. Hyperkalemia from acute kidney injury and potassium supplements Stopped  potassium supplements Hyperkalemia treated with insulin, dextrose and albuterol Repeat potassium has normalized  3. Acute kidney injury in the setting of dehydration from increased ileostomy output Continue IV fluids Repeat BMP in a.m. Discontinue nephrotoxic medications  4. Status post ileostomy and drain placement with CT results as stated above Surgery consultation appreciated IR consult to evaluate liver abscess as per surgery consultation. Start Imodium to decrease ileostomy output   5. Essential hypertension: HCTZ discontinued for now due to hyponatremia When necessary hydralazine ordered.  6. GERD: Continue PPI  7.  Anxiety: Start Ativan as needed  D/w surgery  Management plans discussed with the patient and she is in agreement.  CODE STATUS: full  TOTAL TIME TAKING CARE OF THIS PATIENT: 30 minutes.     POSSIBLE D/C 2 days, DEPENDING ON CLINICAL CONDITION.   Brylon Brenning M.D on  07/02/2017 at 9:21 AM  Between  7am to 6pm - Pager - [phone] After 6pm go to [email or web address] - password [email or web address] [facility]  Office  [phone]  CC: Primary care physician; Marguerita Merles, MD  Note: This dictation was prepared with Dragon dictation along with smaller phrase technology. Any transcriptional errors that result from this process are unintentional.

## 2017-07-03 LAB — GLUCOSE, CAPILLARY
GLUCOSE-CAPILLARY: 90 mg/dL (ref 65–99)
Glucose-Capillary: 112 mg/dL — ABNORMAL HIGH (ref 65–99)
Glucose-Capillary: 95 mg/dL (ref 65–99)
Glucose-Capillary: 98 mg/dL (ref 65–99)

## 2017-07-03 LAB — BASIC METABOLIC PANEL
ANION GAP: 9 (ref 5–15)
BUN: 30 mg/dL — ABNORMAL HIGH (ref 6–20)
CO2: 19 mmol/L — ABNORMAL LOW (ref 22–32)
Calcium: 8.1 mg/dL — ABNORMAL LOW (ref 8.9–10.3)
Chloride: 100 mmol/L — ABNORMAL LOW (ref 101–111)
Creatinine, Ser: 0.99 mg/dL (ref 0.44–1.00)
GFR calc non Af Amer: >60 mL/min (ref >60)
GLUCOSE: 107 mg/dL — AB (ref 65–99)
Potassium: 3.8 mmol/L (ref 3.5–5.1)
Sodium: 128 mmol/L — ABNORMAL LOW (ref 135–145)

## 2017-07-03 MED ORDER — PREMIER PROTEIN SHAKE
11.0000 [oz_av] | Freq: Two times a day (BID) | ORAL | Status: DC
  Administered 2017-07-03: 11 [oz_av] via ORAL

## 2017-07-03 MED ORDER — LOPERAMIDE HCL 2 MG PO CAPS
2.0000 mg | ORAL_CAPSULE | Freq: Three times a day (TID) | ORAL | Status: DC
  Administered 2017-07-03 – 2017-07-04 (×4): 2 mg via ORAL
  Filled 2017-07-03 (×4): qty 1

## 2017-07-03 MED ORDER — LORAZEPAM 0.5 MG PO TABS
0.5000 mg | ORAL_TABLET | Freq: Two times a day (BID) | ORAL | Status: DC
  Administered 2017-07-03 – 2017-07-04 (×3): 0.5 mg via ORAL
  Filled 2017-07-03 (×2): qty 1

## 2017-07-03 MED ORDER — ADULT MULTIVITAMIN W/MINERALS CH
1.0000 | ORAL_TABLET | Freq: Every day | ORAL | Status: DC
  Administered 2017-07-03 – 2017-07-06 (×4): 1 via ORAL
  Filled 2017-07-03 (×4): qty 1

## 2017-07-03 NOTE — Progress Notes (Signed)
[facility] Hospital Day(s): 2.   Post op day(s):  Marland Kitchen   Interval History: Patient seen and examined, no acute events or new complaints overnight. Patient reports she continues to feel better with tolerating a regular diet and improved management of her previously/recently severe GERD, but she continues to have high output drainage from her ileostomy, denies abdominal pain, fever/chills, CP, or SOB. She also experienced some leaking of her ostomy bag.  Review of Systems:  Constitutional: denies fever, chills  HEENT: denies cough or congestion  Respiratory: denies any shortness of breath  Cardiovascular: denies chest pain or palpitations  Gastrointestinal: abdominal pain, N/V, and bowel function as per interval history Genitourinary: denies burning with urination or urinary frequency Musculoskeletal: denies pain, decreased motor or sensation Integumentary: denies any other rashes or skin discolorations except post-surgical abdominal wounds Neurological: denies HA or vision/hearing changes   Vital signs in last 24 hours: [min-max] current  Temp:  [97.7 F (36.5 C)-99 F (37.2 C)] 98.3 F (36.8 C) (10/28 0538) Pulse Rate:  [72-91] 91 (10/28 0538) Resp:  [16-18] 16 (10/27 2101) BP: (80-103)/(53-69) 97/66 (10/28 0538) SpO2:  [97 %-100 %] 97 % (10/28 0538)     Height: 5\' 5"  (165.1 cm) Weight: 148 lb 12.8 oz (67.5 kg) BMI (Calculated): 24.76   Intake/Output this shift:  No intake/output data recorded.   Intake/Output last 2 shifts:  @IOLAST2SHIFTS @   Physical Exam:  Constitutional: alert, cooperative and no distress  HENT: normocephalic without obvious abnormality  Eyes: PERRL, EOM's grossly intact and symmetric  Neuro: CN II - XII grossly intact and symmetric without deficit  Respiratory: breathing non-labored at rest  Cardiovascular: regular rate and sinus rhythm  Gastrointestinal: soft, non-tender, and non-distended with majority of patient's midline laparotomy  wound well-approximated without erythema or drainage except open wound with pink healthy granulation tissue packed with small amount of gauze where a few staples were removed post-operatively, ileostomy appears pink and viable with ostomy bag 1/2 full of thin yellow fluid and gas Musculoskeletal: UE and LE FROM, motor and sensation grossly intact, NT   Labs:  CBC Latest Ref Rng & Units 07/02/2017 07/01/2017 07/01/2017  WBC 3.6 - 11.0 K/uL 10.5 12.5(H) 12.0(H)  Hemoglobin 12.0 - 16.0 g/dL 10.1(L) 10.7(L) 11.2(L)  Hematocrit 35.0 - 47.0 % 29.3(L) 31.4(L) 33.2(L)  Platelets 150 - 440 K/uL 503(H) 587(H) 612(H)   CMP Latest Ref Rng & Units 07/03/2017 07/02/2017 07/01/2017  Glucose 65 - 99 mg/dL 107(H) 99 -  BUN 6 - 20 mg/dL 30(H) 40(H) -  Creatinine 0.44 - 1.00 mg/dL 0.99 1.38(H) -  Sodium 135 - 145 mmol/L 128(L) 127(L) -  Potassium 3.5 - 5.1 mmol/L 3.8 4.5 4.4  Chloride 101 - 111 mmol/L 100(L) 97(L) -  CO2 22 - 32 mmol/L 19(L) 22 -  Calcium 8.9 - 10.3 mg/dL 8.1(L) 8.7(L) -  Total Protein 6.5 - 8.1 g/dL - - -  Total Bilirubin 0.3 - 1.2 mg/dL - - -  Alkaline Phos 38 - 126 U/L - - -  AST 15 - 41 U/L - - -  ALT 14 - 54 U/L - - -   Imaging studies: No new pertinent imaging studies   Assessment/Plan: (ICD-10's: E86.0, K65.1) 51 y.o.femalewith what appears to be profound dehydration with severe electrolyte disturbances, attributable to combination of ileostomy drainage and severely decreased oral intake due to severe uncontrolled GERD 22 Days s/p exploratory laparotomy with abdominal washout, creation of diverting ileostomy, and placement of 19 F Blake pelvic drains  x 2 for colorectal anastomotic disruption 9 Days following laparoscopic sigmoid colectomy with primary EEA anastomosis, attributable at least in part to since-discontinued medication for Rheumatoid Arthritis and complicated by pertinent comorbidities including HTN, HLD, stroke (2013), hyperthyroidism, osteoarthritis, nephrolithiasis,  and a history significant for skin melanoma.  - aggressive resuscitation and correction of electrolytes - continue antibiotics for intraperitoneal abscesses (see cultures) - IR evaluation for possible drainage of peri-hepatic and peri-splenic abscesses (can be done Monday) - increase Imodium to slow ileostomy drainage (may add Lomotil, tincture of opium/opiates if maximum Imodium dosing) - PPI + medical management of medical comorbidities - ambulation, frequent repositioning encouraged - DVT prophylaxis  All of the above findings and recommendations were discussed with the patient and her parents and her medical physician, and all of patient's and her family's questions were answered to their expressed satisfaction.  Thank you for the opportunity to participate in this patient's care.  -- Marilynne Drivers Rosana Hoes, MD, [place]: [facility] - Partnering for exceptional care. Office: [phone]

## 2017-07-03 NOTE — Consult Note (Signed)
[facility] Nurse ostomy consult note Stoma type/location: RLQ ileostomy. Several pouch changes required last night (patient thinks 3). She is not yet independent in ostomy pouch changes and her mother (who is with her today) has only assisted my partner, K. Sanders in pouch changes. Pouch applied early this morning is intact with no evidence of leakage; Bedside RN used 2-inch paper tape to window frame pouch and patient is taught that is not required and that taping a border will not stop leakage, only prolong effluent leaking onto clothing. Effluent may still be on skin.  She reports that she is aware when effluent is on skin due to persistent itching and burning.  Patient is wearing belt.  The existing pouching system requires a clamp; those that I am providing today do not and patient reports that she prefers the [place] [employer] closure mechanism. Patient reports having communicated with my partner K. Sanders via text last evening. Stomal assessment/size: Not seen today Peristomal assessment: Not seen today Treatment options for stomal/peristomal skin: Stomal powder is used during pouch changes.  I do not have any to leave with her today, but will ask my partner to deliver some for tomorrow. Output: thin, yellow and clear effluent. Patient is being rehydrated via IV line. Ostomy pouching: 1pc. 2-inch convex pouching system with skin barrier ring and ostomy belt  Education provided: I have secured  New box of pouches for the patient (5) and this supply, added to the one remaining in her box make 6 left at bedside.  I brought her a new belt today and have provided education regarding taping in a window-frame manner around pouching system (see above). Enrolled patient in [place] program: Yes.  Established on previous admission.   [facility] Nurse wound consult note Reason for Consult: Stage 1 pressure injury to coccygeal area-POA.  Non blanching erythema documented and patient reported pain in the  area. Area has resolved overnight: there is no erythema, tenderness or warmth at the coccygeal area and are blanches easily.  Patient reports that she feel "better back there."  Patient is ambulatory, reports that she sits a great deal at home. Is wearing a silicone foam dressing to the sacrum, that I peel back for assessment and replace. Wound type:Pressure. Pressure Injury POA: Yes Measurement:resolved Wound ZCH:YIFO Drainage (amount, consistency, odor) None Periwound: Intact. Dressing procedure/placement/frequency: I am providing patient today with a pressure redistribution cushion for her chair and have instructed her to take it home with her upon discharge. Additionally, while she is in house she agrees to standing every 30 minutes to an hour for pressure redistribution.  She will turn side to side while in bed to prevent prolonged pressure to sacrum/coccygeal area.  [facility] nursing team will follow, and will remain available to this patient, her family, the nursing and medical teams.   Thanks, Maudie Flakes, MSN, RN, [place], [facility]  Pager# [phone]

## 2017-07-03 NOTE — Progress Notes (Signed)
Initial Nutrition Assessment  DOCUMENTATION CODES:   Severe malnutrition in context of acute illness/injury  INTERVENTION:  Provide Premier Protein po BID, each supplement provides 160 kcal and 30 grams of protein.  Provide multivitamin with minerals daily.  Reviewed "Ileostomy Nutrition Therapy" handout from the [employer] with patient and her family. Encouraged patient to consume refined and white grain foods with less than 2g of fiber per serving as well as soft, well-cooked proteins, and well cooked vegetables without seeds or skin.RD discussed why it is important to adhere to list of recommended foods, and foods to avoid, to maintain ostomy integrity and decrease risk of gas, diarrhea, and blockage related complications. Encouraged patient to consume 8 to 10 cups of liquid per day and to drink liquids 30 minutes after meals or snacks to prevent flushing. After 6 weeks, as patient's diet returns to normal, encouraged adding 1 new food in per day, for a few days to monitor tolerance. Reviewed oral hydration recipes on handout for patient to use at home.  NUTRITION DIAGNOSIS:   Severe Malnutrition related to acute illness (uncontrolled GERD, sigmoid colectomy, exploratory laparotomy with creation of diverting loop ileostomy, increased ileostomy output) as evidenced by 15.9 percent weight loss over 1 month, moderate fat depletion, mild muscle depletion.  GOAL:   Patient will meet greater than or equal to 90% of their needs, Other (Comment) (Prevent progression of sacral ulcer past stage I through intake of adequate calories, protein, and micronutrients.)  MONITOR:   PO intake, Supplement acceptance, Labs, Weight trends, Skin, I & O's  REASON FOR ASSESSMENT:   Consult Assessment of nutrition requirement/status, Diet education  ASSESSMENT:   51 year old female with PMHx of RA, HTN, HLD, hyperthyroidism s/p total thyroidectomy, hx CVA 2013, recent sigmoid  diverticulitis with abscess s/p laparoscopic sigmoid colectomy on 9/25 then exploratory laparotomy with washout of stool/abscess and creation of diverting loop ileostomy on 10/4 in setting of post-operative colorectal anastomotic disruption/leak. Patient directly admitted from surgical follow-up with abnormal labs found to have profound dehydration, electrolyte disturbances, increased ileostomy output, AKI, uncontrolled GERD.   Met with patient and her mother and father at bedside. Patient is an employee here at [employer] [facility]. She reports she had a decreased appetite following her surgery earlier this month in setting of severe GERD. Prior to admission she could barely tolerate even sips of water. Patient reports she has not been eating or drinking enough because of that this past month. However, she is unable to specify exact intake PTA. She also endorses increased ileostomy output that has contributed to her dehydration. She reports her ileostomy bags do not have a way to measure output, but she was changing the bag approximately 4 times daily PTA. Prior to surgeries patient reports she had been eating well. She reports that today she was able to eat better because her GERD is much improved. She already had breakfast today and patient's father brought her some lunch (she does not enjoy the hospital food very much). Patient reports she had been drinking some Ensure at home and it felt good to drink something cold with her GERD. She does not want Ensure here because she feels she is eating better now. Discussed patient's significant weight loss and need to meet calorie/protein needs. After discussion she is amenable to trying Premier Protein.  Patient reports UBW was 174 lbs. Per chart patient was 176.9 lbs on 06/14/2017. Since then she has lost 28.1 lbs (15.9% body weight) over almost 1 month,  which is significant for time frame. As patient was profoundly dehydrated on admission, weight may have come increased some with  re-hydration, but unable to check weight as patient was sitting in chair at time of RD assessment and not on bed. However, even a weight loss of 5% over 1 month would be significant.  Medications reviewed and include: ciprofloxacin, Novolog 0-9 units TID, levothyroxine, Imodium 2 mg TID, Flagyl, pantoprazole, NS @ 125 ml/hr.  Labs reviewed: CBG 90-112, Sodium 128 (trending up), Chloride 100 (trending up), CO2 19, BUN 30 (trending down).  I/O: 1925 mL ileostomy output yesterday; 1800 mL ileostomy output the day before  Discussed with RN.  NUTRITION - FOCUSED PHYSICAL EXAM:    Most Recent Value  Orbital Region  Moderate depletion  Upper Arm Region  Moderate depletion  Thoracic and Lumbar Region  Mild depletion  Buccal Region  Moderate depletion  Temple Region  Mild depletion  Clavicle Bone Region  No depletion  Clavicle and Acromion Bone Region  No depletion  Scapular Bone Region  No depletion  Dorsal Hand  No depletion  Patellar Region  No depletion  Anterior Thigh Region  No depletion  Posterior Calf Region  Mild depletion  Edema (RD Assessment)  None  Hair  Reviewed  Eyes  Reviewed  Mouth  Reviewed  Skin  Reviewed [skin dry, skin tenting on hand]  Nails  Reviewed      Diet Order:  Diet regular Room service appropriate? Yes; Fluid consistency: Thin  EDUCATION NEEDS:   Education needs have been addressed  Skin:  Skin Assessment: Skin Integrity Issues: Skin Integrity Issues:: Stage I, Incisions Stage I: medial sacrum Incisions: closed incisions to abdomen  Last BM:  07/03/2017 - medium type 7 per ileostomy  Height:   Ht Readings from Last 1 Encounters:  07/01/17 5' 5" (1.651 m)    Weight:   Wt Readings from Last 1 Encounters:  07/02/17 148 lb 12.8 oz (67.5 kg)    Ideal Body Weight:  56.8 kg  BMI:  Body mass index is 24.76 kg/m.  Estimated Nutritional Needs:   Kcal:  1670-1930 (MSJ x 1.3-1.5)  Protein:  81-95 grams (1.2-1.4 grams/kg)  Fluid:  2-2.4  L/day (30-35 mL/kg)  Willey Blade, MS, RD, LDN Office: [phone] Pager: [phone] After Hours/Weekend Pager: [phone]

## 2017-07-03 NOTE — Progress Notes (Signed)
[place] at [place] NAME: Rebecca Obrien    MR#:  [id number]  DATE OF BIRTH:  03-28-1966  SUBJECTIVE:  Frustrated this am Still with increased ileostomy output Anxiety a bit better   REVIEW OF SYSTEMS:    Review of Systems  Constitutional: Negative for fever, chills weight loss HENT: Negative for ear pain, nosebleeds, congestion, facial swelling, rhinorrhea, neck pain, neck stiffness and ear discharge.   Respiratory: Negative for cough, shortness of breath, wheezing  Cardiovascular: Negative for chest pain, palpitations and leg swelling.  Gastrointestinal:denies heartburn, NO abdominal pain, vomiting, diarrhea or consitpation Genitourinary: Negative for dysuria, urgency, frequency, hematuria Musculoskeletal: Negative for back pain or joint pain Neurological: Negative for dizziness, seizures, syncope, focal weakness,  numbness and headaches.  Hematological: Does not bruise/bleed easily.  Psychiatric/Behavioral: Negative for hallucinations, confusion, dysphoric mood +anxiety    Tolerating Diet: yes      DRUG ALLERGIES:   Allergies  Allergen Reactions  . Adhesive [Tape]     Skin breakdown/dermatitis with prolonged use   . Latex Dermatitis    With prolonged use  NOT LATEX ALLERGIC.  Patient has problem with adhesive, not latex    VITALS:  Blood pressure 97/66, pulse 91, temperature 98.3 F (36.8 C), temperature source Oral, resp. rate 16, height 5\' 5"  (1.651 m), weight 67.5 kg (148 lb 12.8 oz), SpO2 97 %.  PHYSICAL EXAMINATION:  Constitutional: Appears well-developed and well-nourished. No distress. HENT: Normocephalic. Marland Kitchen Oropharynx is clear and moist.  Eyes: Conjunctivae and EOM are normal. PERRLA, no scleral icterus.  Neck: Normal ROM. Neck supple. No JVD. No tracheal deviation. CVS: RRR, S1/S2 +, no murmurs, no gallops, no carotid bruit.  Pulmonary: Effort and breath sounds normal, no stridor, rhonchi, wheezes,  rales.  Abdominal: Soft. BS +,  no distension, tenderness, rebound or guarding.  +Ileostomy right 2 JP drains still with white fluid draining Healed incision site Musculoskeletal: Normal range of motion. No edema and no tenderness.  Neuro: Alert. CN 2-12 grossly intact. No focal deficits. Skin: Skin is warm and dry. No rash noted. Psychiatric:  A bit anxious   LABORATORY PANEL:   CBC  Recent Labs Lab 07/02/17 0407  WBC 10.5  HGB 10.1*  HCT 29.3*  PLT 503*   ------------------------------------------------------------------------------------------------------------------  Chemistries   Recent Labs Lab 07/01/17 1410  07/03/17 0311  NA 123*  < > 128*  K 5.5*  < > 3.8  CL 89*  < > 100*  CO2 21*  < > 19*  GLUCOSE 96  < > 107*  BUN 49*  < > 30*  CREATININE 1.38*  < > 0.99  CALCIUM 9.4  < > 8.1*  AST 20  --   --   ALT 14  --   --   ALKPHOS 126  --   --   BILITOT 0.8  --   --   < > = values in this interval not displayed. ------------------------------------------------------------------------------------------------------------------  Cardiac Enzymes No results for input(s): TROPONINI in the last 168 hours. ------------------------------------------------------------------------------------------------------------------  RADIOLOGY:  Ct Abdomen Pelvis W Contrast  Result Date: 07/01/2017 CLINICAL DATA:  History of diverticulitis 2 weeks ago. Follow-up colostomy bag. Two drains. EXAM: CT ABDOMEN AND PELVIS WITH CONTRAST TECHNIQUE: Multidetector CT imaging of the abdomen and pelvis was performed using the standard protocol following bolus administration of intravenous contrast. CONTRAST:  29mL ISOVUE-300 IOPAMIDOL (ISOVUE-300) INJECTION 61% COMPARISON:  06/16/2017 and 06/08/2017 FINDINGS: Lower chest: Within normal. Hepatobiliary: Stable 2 cm cyst  over the left lobe of the liver. Previous cholecystectomy. Biliary tree is within normal. There are multiple hepatic subcapsular  fluid collections with the largest junction of the right the left lobe measuring 2.3 x 7.6 cm. These are slightly worse compared to the prior study likely present subcapsular abscesses. Pancreas: Within normal. Spleen: Slight interval worsening of a subcapsular fluid collection likely abscess. Adrenals/Urinary Tract: Adrenal glands are normal. Kidneys are normal in size without hydronephrosis or nephrolithiasis. 1.3 cm cyst over the mid to lower pole left kidney unchanged. Ureters are within normal. Bladder is within normal. Stomach/Bowel: Stomach is normal evidence patient's ileostomy site over the right lower quadrant unchanged and within normal. Terminal ileum is within normal. Appendix is not visualized. Surgical suture line over the rectosigmoid junction with subtle adjacent foci of air improved. The transverse, descending sigmoid colon are decompressed. Vascular/Lymphatic: Mild calcified plaque over the abdominal aorta and iliac arteries. No adenopathy. Reproductive: Previous hysterectomy. Other: Fluid collection containing small focus of air over the left paracolic gutter adjacent the descending colon with interval decrease in size measuring 2.4 x 2.9 cm (previously 3.3 x 4.2 cm). Bilateral lower abdominal/ pelvic drains are present without significant change. Collection of air and fluid with air-fluid level over the midline lower abdominal wall/ pelvic incision measuring 3.1 x 5 cm which may be infected versus noninfected postoperative collection. Musculoskeletal: Unchanged. IMPRESSION: History of diverticulitis post partial sigmoid colectomy with right lower quadrant ileostomy. Tiny extraluminal foci of air adjacent the rectosigmoid anastomosis improved. Improved air and fluid collection adjacent the distal descending colon in the left pericolic gutter measuring 2.4 x 2.9 cm likely a small abscess. Several hepatic subcapsular and splenic subcapsular fluid collections with slight interval worsening likely  small abscesses. Two percutaneous drains with tips over the pelvis unchanged. Slight worsening air-fluid collection over the midline lower anterior abdominal wall/ pelvic incision measuring 3.1 x 5 cm which may be infected versus noninfected postop collection. Stable 2 cm hepatic cyst. 1.3 cm left renal cyst unchanged. Aortic Atherosclerosis (ICD10-I70.0). These results were called by telephone at the time of interpretation on 07/01/2017 at 2:14 pm to Dr. Rosana Hoes, who verbally acknowledged these results. Electronically Signed   By: Marin Olp M.D.   On: 07/01/2017 14:15     ASSESSMENT AND PLAN:   51 year old female with history of diverticulitis status post perforation and now with ileostomy who presented for outpatient routine follow-up and found to have hyperkalemia and hyponatremia.  1. Hyponatremia: This is from dehydration from increased ileostomy output Continue IV fluids @ 125 cc/hr and follow Na levels.  Continue to hold hydrochlorothiazide  2. Hyperkalemia from acute kidney injury and potassium supplements Stopped  potassium supplements Potassium is within normal limits  BMP for a.m.  Due to increased ileostomy output her potassium level may drop so we'll need to monitor daily potassium levels and perhaps start her on a daily low-dose of potassium supplementation.   3. Acute kidney injury in the setting of dehydration from increased ileostomy output Creatinine has improved with IV fluids  Continue IV fluids  Discontinue nephrotoxic medications  4. Status post ileostomy and drain placement with CT results as stated above Surgery consultation appreciated IR consult to evaluate liver abscess as per surgery consultation. (This will happen tomorrow) Continue scheduled Imodium to decrease ileostomy output   5. Essential hypertension: HCTZ discontinued for now due to hyponatremia When necessary hydralazine ordered.  6. GERD: Continue PPI  7.  Anxiety: Continue scheduled  Ativan May need a prescription at  discharge and she can follow-up with her PCP after discharge   D/w Dr. Rosana Hoes   Management plans discussed with the patient and she is in agreement.  CODE STATUS: full  TOTAL TIME TAKING CARE OF THIS PATIENT: 28 minutes.     POSSIBLE D/C 2 days, DEPENDING ON CLINICAL CONDITION.   Stephannie Broner M.D on 07/03/2017 at 9:04 AM  Between 7am to 6pm - Pager - [phone] After 6pm go to [email or web address] - password [email or web address] [facility]  Office  [phone]  CC: Primary care physician; Marguerita Merles, MD  Note: This dictation was prepared with Dragon dictation along with smaller phrase technology. Any transcriptional errors that result from this process are unintentional.

## 2017-07-04 LAB — GLUCOSE, CAPILLARY
GLUCOSE-CAPILLARY: 80 mg/dL (ref 65–99)
GLUCOSE-CAPILLARY: 97 mg/dL (ref 65–99)
Glucose-Capillary: 96 mg/dL (ref 65–99)
Glucose-Capillary: 124 mg/dL — ABNORMAL HIGH (ref 65–99)
Glucose-Capillary: 88 mg/dL (ref 65–99)
Glucose-Capillary: 96 mg/dL (ref 65–99)
Glucose-Capillary: 105 mg/dL — ABNORMAL HIGH (ref 65–99)

## 2017-07-04 LAB — BASIC METABOLIC PANEL
ANION GAP: 4 — AB (ref 5–15)
BUN: 21 mg/dL — ABNORMAL HIGH (ref 6–20)
CALCIUM: 7.7 mg/dL — AB (ref 8.9–10.3)
CO2: 21 mmol/L — AB (ref 22–32)
Chloride: 105 mmol/L (ref 101–111)
Creatinine, Ser: 1.02 mg/dL — ABNORMAL HIGH (ref 0.44–1.00)
GFR calc non Af Amer: >60 mL/min (ref >60)
Glucose, Bld: 95 mg/dL (ref 65–99)
Potassium: 4 mmol/L (ref 3.5–5.1)
Sodium: 130 mmol/L — ABNORMAL LOW (ref 135–145)

## 2017-07-04 LAB — CBC
HCT: 24.8 % — ABNORMAL LOW (ref 35.0–47.0)
HEMOGLOBIN: 8.5 g/dL — AB (ref 12.0–16.0)
MCH: 27.1 pg (ref 26.0–34.0)
MCHC: 34.4 g/dL (ref 32.0–36.0)
MCV: 78.7 fL — ABNORMAL LOW (ref 80.0–100.0)
Platelets: 418 10*3/uL (ref 150–440)
RBC: 3.16 MIL/uL — AB (ref 3.80–5.20)
RDW: 15.7 % — ABNORMAL HIGH (ref 11.5–14.5)
WBC: 8.5 10*3/uL (ref 3.6–11.0)

## 2017-07-04 LAB — MAGNESIUM: MAGNESIUM: 1.6 mg/dL — AB (ref 1.7–2.4)

## 2017-07-04 MED ORDER — LOPERAMIDE HCL 2 MG PO CAPS
4.0000 mg | ORAL_CAPSULE | Freq: Three times a day (TID) | ORAL | Status: DC
  Administered 2017-07-04 – 2017-07-06 (×5): 4 mg via ORAL
  Filled 2017-07-04 (×5): qty 2

## 2017-07-04 MED ORDER — MAGNESIUM SULFATE 2 GM/50ML IV SOLN
2.0000 g | Freq: Once | INTRAVENOUS | Status: AC
  Administered 2017-07-04: 2 g via INTRAVENOUS
  Filled 2017-07-04: qty 50

## 2017-07-04 MED ORDER — LORAZEPAM 0.5 MG PO TABS
0.5000 mg | ORAL_TABLET | Freq: Two times a day (BID) | ORAL | Status: DC | PRN
Start: 2017-07-04 — End: 2017-07-06
  Administered 2017-07-04 – 2017-07-06 (×4): 0.5 mg via ORAL
  Filled 2017-07-04 (×4): qty 1

## 2017-07-04 NOTE — Care Management (Signed)
Patient open with [facility] for nursing.  Sarah with [facility] aware of admission

## 2017-07-04 NOTE — Progress Notes (Signed)
[place] at [place] NAME: Rebecca Obrien    MR#:  [id number]  DATE OF BIRTH:  April 19, 1966  SUBJECTIVE:  Still a lot of ileostomy output REVIEW OF SYSTEMS:    Review of Systems  Constitutional: Negative for fever, chills weight loss HENT: Negative for ear pain, nosebleeds, congestion, facial swelling, rhinorrhea, neck pain, neck stiffness and ear discharge.   Respiratory: Negative for cough, shortness of breath, wheezing  Cardiovascular: Negative for chest pain, palpitations and leg swelling.  Gastrointestinal:denies heartburn, NO abdominal pain, vomiting, diarrhea or consitpation Genitourinary: Negative for dysuria, urgency, frequency, hematuria Musculoskeletal: Negative for back pain or joint pain Neurological: Negative for dizziness, seizures, syncope, focal weakness,  numbness and headaches.  Hematological: Does not bruise/bleed easily.  Psychiatric/Behavioral: Negative for hallucinations, confusion, dysphoric mood +anxiety DRUG ALLERGIES:   Allergies  Allergen Reactions  . Adhesive [Tape]     Skin breakdown/dermatitis with prolonged use   . Latex Dermatitis    With prolonged use  NOT LATEX ALLERGIC.  Patient has problem with adhesive, not latex    VITALS:  Blood pressure (!) 105/91, pulse 91, temperature 97.8 F (36.6 C), temperature source Oral, resp. rate 16, height 5\' 5"  (1.651 m), weight 148 lb 12.8 oz (67.5 kg), SpO2 98 %.  PHYSICAL EXAMINATION:  Constitutional: Appears well-developed and well-nourished. No distress. HENT: Normocephalic. Marland Kitchen Oropharynx is clear and moist.  Eyes: Conjunctivae and EOM are normal. PERRLA, no scleral icterus.  Neck: Normal ROM. Neck supple. No JVD. No tracheal deviation. CVS: RRR, S1/S2 +, no murmurs, no gallops, no carotid bruit.  Pulmonary: Effort and breath sounds normal, no stridor, rhonchi, wheezes, rales.  Abdominal: Soft. BS +,  no distension, tenderness, rebound or guarding.   +Ileostomy right 2 JP drains still with white fluid draining Healed incision site Musculoskeletal: Normal range of motion. No edema and no tenderness.  Neuro: Alert. CN 2-12 grossly intact. No focal deficits. Skin: Skin is warm and dry. No rash noted. Psychiatric:  A bit anxious   LABORATORY PANEL:   CBC  Recent Labs Lab 07/04/17 0552  WBC 8.5  HGB 8.5*  HCT 24.8*  PLT 418   ------------------------------------------------------------------------------------------------------------------  Chemistries   Recent Labs Lab 07/01/17 1410  07/04/17 0552  NA 123*  < > 130*  K 5.5*  < > 4.0  CL 89*  < > 105  CO2 21*  < > 21*  GLUCOSE 96  < > 95  BUN 49*  < > 21*  CREATININE 1.38*  < > 1.02*  CALCIUM 9.4  < > 7.7*  MG  --   --  1.6*  AST 20  --   --   ALT 14  --   --   ALKPHOS 126  --   --   BILITOT 0.8  --   --   < > = values in this interval not displayed. ------------------------------------------------------------------------------------------------------------------  Cardiac Enzymes No results for input(s): TROPONINI in the last 168 hours. ------------------------------------------------------------------------------------------------------------------  RADIOLOGY:  No results found.   ASSESSMENT AND PLAN:   51 year old female with history of diverticulitis status post perforation and now with ileostomy who presented for outpatient routine follow-up and found to have hyperkalemia and hyponatremia.  1. Hyponatremia: This is from dehydration from increased ileostomy output Continue IV fluids @ 125 cc/hr and follow Na levels.  Continue to hold hydrochlorothiazide  2. Hyperkalemia from acute kidney injury and potassium supplements Stopped  potassium supplements Potassium is within normal limits  BMP  for a.m.  Due to increased ileostomy output her potassium level may drop so we'll need to monitor daily potassium levels and perhaps start her on a daily  low-dose of potassium supplementation.  3. Acute kidney injury in the setting of dehydration from increased ileostomy output Creatinine has improved with IV fluids  Continue IV fluids  Discontinued nephrotoxic medications  4. Status post ileostomy and drain placement with CT results as stated above Surgery consultation appreciated IR consult to evaluate liver abscess as per surgery consultation.  Continue scheduled Imodium to decrease ileostomy output  5. Essential hypertension: HCTZ discontinued for now due to hyponatremia When necessary hydralazine ordered.  6. GERD: Continue PPI  7.  Anxiety: Continue scheduled Ativan May need a prescription at discharge and she can follow-up with her PCP after discharge  Hypomagnesemia. Given IV magnesium and a follow-up level.  Management plans discussed with the patient and she is in agreement.  CODE STATUS: full  TOTAL TIME TAKING CARE OF THIS PATIENT: 32 minutes.  POSSIBLE D/C 2 days, DEPENDING ON CLINICAL CONDITION.   Demetrios Loll M.D on 07/04/2017 at 1:46 PM  Between 7am to 6pm - Pager - [phone] After 6pm go to [email or web address] - password [email or web address] [facility]  Office  [phone]  CC: Primary care physician; Marguerita Merles, MD  Note: This dictation was prepared with Dragon dictation along with smaller phrase technology. Any transcriptional errors that result from this process are unintentional.

## 2017-07-04 NOTE — Progress Notes (Signed)
CC: Ileostomy Subjective: Patient reports continued to have high output from her ileostomy.  She cannot tell any clear change in output with the addition of the current dose of loperamide.  She is tolerating a diet.  States her pain is well controlled.  Objective: Vital signs in last 24 hours: Temp:  [97.8 F (36.6 C)-98 F (36.7 C)] 97.8 F (36.6 C) (10/29 0511) Pulse Rate:  [90-91] 91 (10/29 0511) Resp:  [16] 16 (10/29 0511) BP: (101-105)/(63-91) 105/91 (10/29 0511) SpO2:  [98 %-100 %] 98 % (10/29 0511) Last BM Date: 07/03/17 (iliostomy)  Intake/Output from previous day: 10/28 0701 - 10/29 0700 In: 2073 [I.V.:2073] Out: 1850 [Stool:1850] Intake/Output this shift: Total I/O In: 816.1 [I.V.:816.1] Out: 500 [Stool:500]  Physical exam:  General: No acute distress Chest: Clear to auscultation Heart: Regular rate and rhythm Abdomen: Soft, appropriately tender at her incision sites, nondistended.  JP drains with a purulent output.  Ileostomy present in the right upper quadrant with a bilious output.  Lab Results: CBC   Recent Labs  07/02/17 0407 07/04/17 0552  WBC 10.5 8.5  HGB 10.1* 8.5*  HCT 29.3* 24.8*  PLT 503* 418   BMET  Recent Labs  07/03/17 0311 07/04/17 0552  NA 128* 130*  K 3.8 4.0  CL 100* 105  CO2 19* 21*  GLUCOSE 107* 95  BUN 30* 21*  CREATININE 0.99 1.02*  CALCIUM 8.1* 7.7*   PT/INR No results for input(s): LABPROT, INR in the last 72 hours. ABG No results for input(s): PHART, HCO3 in the last 72 hours.  Invalid input(s): PCO2, PO2  Studies/Results: No results found.  Anti-infectives: Anti-infectives    Start     Dose/Rate Route Frequency Ordered Stop   07/01/17 2000  ciprofloxacin (CIPRO) tablet 500 mg     500 mg Oral 2 times daily 07/01/17 1730     07/01/17 1800  metroNIDAZOLE (FLAGYL) tablet 500 mg     500 mg Oral 3 times daily 07/01/17 1730        Assessment/Plan:  51 year old female with a high output loop ileostomy  leading to electrolyte abnormality's.  Discussed with the patient in the multiple possible remedies for the output.  Also discussed the importance of maintaining oral hydration and nutrition.  She voiced understanding.  I will increase her loperamide dose today and we will monitor over the next 24-48 hours for effect.  Encourage ambulation and incentive spirometer usage.  General surgery will continue to follow with you.  Keiandra Sullenger T. Adonis Huguenin, MD, [facility] General Surgeon [facility]  Day [facility] [phone] Night [facility] [phone] 07/04/2017

## 2017-07-04 NOTE — Consult Note (Signed)
[place] Nurse ostomy follow up Stoma type/location: RLQ Ileostomy Stomal assessment/size: oval shaped, 2.4 cm wide and 0.4 cm length.  Pattern is cut off center to avoid JP drains in RLQ.  This pattern will be sent home with patient.   Peristomal assessment: Peristomal skin is denuded on lower hemisphere of peristomal skin, barrier ring is provided to protect this skin.  She states her skin has improved.  This pouch has been in place for over 24 hours.  Treatment options for stomal/peristomal skin: stoma powder, barrier ring and 1 piece convex bag with a belt.  Output liquid yellow stool.  Patient is dehydrated due to continuous leaking stool Ostomy pouching: 1pc convex with barrier ring and belt  Education provided: Patient is able to cut pouch today.  Application is difficult for her due to being below her field of vision and proximity to drains.  She understands that she can stand in front of a mirror for assistance.  Her pouch is changed. Reinforced to empty when 1/3 full to avoid extra weight and losing seal.  Belt is in place and pouch is intact and immediately starts having output.   Enrolled patient in [place] Start Discharge program: Yes has been ordering pouch with clamp closure.  Given product number for roll closure pouch.  Has an extra belt, stoma powder and supplies for 3 pouch changes at her bedside.  [place] team will follow.  Domenic Moras RN BSN [place] Pager [phone]

## 2017-07-05 LAB — CBC
HCT: 23.3 % — ABNORMAL LOW (ref 35.0–47.0)
HEMOGLOBIN: 7.9 g/dL — AB (ref 12.0–16.0)
MCH: 27 pg (ref 26.0–34.0)
MCHC: 34 g/dL (ref 32.0–36.0)
MCV: 79.4 fL — ABNORMAL LOW (ref 80.0–100.0)
Platelets: 400 10*3/uL (ref 150–440)
RBC: 2.94 MIL/uL — AB (ref 3.80–5.20)
RDW: 16.2 % — ABNORMAL HIGH (ref 11.5–14.5)
WBC: 8.4 10*3/uL (ref 3.6–11.0)

## 2017-07-05 LAB — BASIC METABOLIC PANEL
ANION GAP: 5 (ref 5–15)
BUN: 17 mg/dL (ref 6–20)
CALCIUM: 7.6 mg/dL — AB (ref 8.9–10.3)
CHLORIDE: 108 mmol/L (ref 101–111)
CO2: 18 mmol/L — AB (ref 22–32)
Creatinine, Ser: 0.91 mg/dL (ref 0.44–1.00)
GFR calc non Af Amer: >60 mL/min (ref >60)
Glucose, Bld: 106 mg/dL — ABNORMAL HIGH (ref 65–99)
Potassium: 4 mmol/L (ref 3.5–5.1)
Sodium: 131 mmol/L — ABNORMAL LOW (ref 135–145)

## 2017-07-05 LAB — MAGNESIUM: MAGNESIUM: 2 mg/dL (ref 1.7–2.4)

## 2017-07-05 LAB — GLUCOSE, CAPILLARY: Glucose-Capillary: 90 mg/dL (ref 65–99)

## 2017-07-05 NOTE — Progress Notes (Signed)
[facility] [place] NAME: Rebecca Obrien    MR#:  [id number]  DATE OF BIRTH:  [birth year]  SUBJECTIVE:  better ileostomy output REVIEW OF SYSTEMS:    Review of Systems  Constitutional: Negative for fever, chills weight loss HENT: Negative for ear pain, nosebleeds, congestion, facial swelling, rhinorrhea, neck pain, neck stiffness and ear discharge.   Respiratory: Negative for cough, shortness of breath, wheezing  Cardiovascular: Negative for chest pain, palpitations and leg swelling.  Gastrointestinal:denies heartburn, NO abdominal pain, vomiting, diarrhea or consitpation Genitourinary: Negative for dysuria, urgency, frequency, hematuria Musculoskeletal: Negative for back pain or joint pain Neurological: Negative for dizziness, seizures, syncope, focal weakness,  numbness and headaches.  Hematological: Does not bruise/bleed easily.  Psychiatric/Behavioral: Negative for hallucinations, confusion, dysphoric mood +anxiety DRUG ALLERGIES:   Allergies  Allergen Reactions  . Adhesive [Tape]     Skin breakdown/dermatitis with prolonged use   . Latex Dermatitis    With prolonged use  NOT LATEX ALLERGIC.  Patient has problem with adhesive, not latex    VITALS:  Blood pressure 105/68, pulse 83, temperature 98.2 F (36.8 C), temperature source Oral, resp. rate 16, height 5\' 5"  (1.651 m), weight 155 lb 9.6 oz (70.6 kg), SpO2 100 %.  PHYSICAL EXAMINATION:  Constitutional: Appears well-developed and well-nourished. No distress. HENT: Normocephalic. Marland Kitchen Oropharynx is clear and moist.  Eyes: Conjunctivae and EOM are normal. PERRLA, no scleral icterus.  Neck: Normal ROM. Neck supple. No JVD. No tracheal deviation. CVS: RRR, S1/S2 +, no murmurs, no gallops, no carotid bruit.  Pulmonary: Effort and breath sounds normal, no stridor, rhonchi, wheezes, rales.  Abdominal: Soft. BS +,  no distension, tenderness, rebound or guarding.  +Ileostomy  right 2 JP drains still with white fluid draining Healed incision site Musculoskeletal: Normal range of motion. No edema and no tenderness.  Neuro: Alert. CN 2-12 grossly intact. No focal deficits. Skin: Skin is warm and dry. No rash noted. Psychiatric:  A bit anxious   LABORATORY PANEL:   CBC  Recent Labs Lab 07/05/17 0327  WBC 8.4  HGB 7.9*  HCT 23.3*  PLT 400   ------------------------------------------------------------------------------------------------------------------  Chemistries   Recent Labs Lab 07/01/17 1410  07/05/17 0327  NA 123*  < > 131*  K 5.5*  < > 4.0  CL 89*  < > 108  CO2 21*  < > 18*  GLUCOSE 96  < > 106*  BUN 49*  < > 17  CREATININE 1.38*  < > 0.91  CALCIUM 9.4  < > 7.6*  MG  --   < > 2.0  AST 20  --   --   ALT 14  --   --   ALKPHOS 126  --   --   BILITOT 0.8  --   --   < > = values in this interval not displayed. ------------------------------------------------------------------------------------------------------------------  Cardiac Enzymes No results for input(s): TROPONINI in the last 168 hours. ------------------------------------------------------------------------------------------------------------------  RADIOLOGY:  No results found.   ASSESSMENT AND PLAN:   51 year old female with history of diverticulitis status post perforation and now with ileostomy who presented for outpatient routine follow-up and found to have hyperkalemia and hyponatremia.  1. Hyponatremia: This is from dehydration from increased ileostomy output Continue IV fluids @ 125 cc/hr and follow Na levels. Improving.  Continue to hold hydrochlorothiazide  2. Hyperkalemia from acute kidney injury and potassium supplements Stopped  potassium supplements Potassium is within normal limits  BMP for a.m.  Due to increased ileostomy output her potassium level may drop so we'll need to monitor daily potassium levels and perhaps start her on a daily low-dose  of potassium supplementation.  3. Acute kidney injury in the setting of dehydration from increased ileostomy output Creatinine has improved with IV fluids  Continue IV fluids  Discontinued nephrotoxic medications  4. Status post ileostomy and drain placement with CT results as stated above Surgery consultation appreciated IR consult to evaluate liver abscess as per surgery consultation.  Continue scheduled Imodium to decrease ileostomy output Per IR consult,  planning for repeat CT imaging in the next few days for assessment of status and drains.   5. Essential hypertension: HCTZ discontinued for now due to hyponatremia BP is low.  6. GERD: Continue PPI  7.  Anxiety: Continue scheduled Ativan May need a prescription at discharge and she can follow-up with her PCP after discharge  Hypomagnesemia. Given IV magnesium and improved.  Management plans discussed with the patient and she is in agreement.  CODE STATUS: full  TOTAL TIME TAKING CARE OF THIS PATIENT: 32 minutes.  POSSIBLE D/C 2 days, DEPENDING ON CLINICAL CONDITION.   Demetrios Loll M.D on 07/05/2017 at 2:48 PM  Between 7am to 6pm - Pager - [phone] After 6pm go to [email or web address] - password [email or web address] [facility]  Office  [phone]  CC: Primary care physician; Marguerita Merles, MD  Note: This dictation was prepared with Dragon dictation along with smaller phrase technology. Any transcriptional errors that result from this process are unintentional.

## 2017-07-05 NOTE — Plan of Care (Signed)
Problem: Education: Goal: Knowledge of [facility] Education information/materials will improve Outcome: Progressing Pt educated on prescribed medications and given a print out of all medications during my care. This RN explained the sheet and sat with pt to go over every medication and its schedule.   Problem: Pain Managment: Goal: General experience of comfort will improve Outcome: Progressing Pt has had no complaints of pain during my care.   Problem: Activity: Goal: Risk for activity intolerance will decrease Outcome: Progressing Pt has requested both doses of PRN Ativan during my shift.

## 2017-07-05 NOTE — Consult Note (Signed)
Chief Complaint: Patient was seen in consultation today for intra-abdominal fluid collections  Referring Physician(s): Dr. Tama High  Supervising Physician: Markus Daft  Patient Status: [facility] - [facility]  History of Present Illness: Rebecca Obrien is a 51 y.o. female with arthritis, rheumatoid arthritis, HTN, diverticulitits who was recently admitted in August 2018 with perforated diverticulitis.  She initially did well with antibiotics and conservative management, however ultimately required sigmoid colectomy 05/31/17 after abscess development. She underwent surgical resection with ileostomy creation secondary to anastomotic breakdown 06/09/17. She does have 2 lower abdominal drains placed by surgery that remain post-op. Lab work at her surgeon's office identified electrolyte abnormalities likely related to increased output and she was referred to the ED for ongoing management of high ileostomy output.   CT Abdomen 07/01/17: History of diverticulitis post partial sigmoid colectomy with right lower quadrant ileostomy. Tiny extraluminal foci of air adjacent the rectosigmoid anastomosis improved. Improved air and fluid collection adjacent the distal descending colon in the left pericolic gutter measuring 2.4 x 2.9 cm likely a small abscess. Several hepatic subcapsular and splenic subcapsular fluid collections with slight interval worsening likely small abscesses. Two percutaneous drains with tips over the pelvis unchanged. Slight worsening air-fluid collection over the midline lower anterior abdominal wall/ pelvic incision measuring 3.1 x 5 cm which may be infected versus noninfected postop collection.  IR consulted for possible aspiration and drainage of peri-hepatic and peri-splenic fluid collections.  Past Medical History:  Diagnosis Date  . Arthritis    RA  . Cancer ([place])    skin melanoma  . Collagen vascular disease (HCC)    RA  . Difficult airway   . Difficult intubation      Very anterior airway. Small TMD, small mouth opening  . Diverticulitis of sigmoid colon   . History of kidney stones   . Hyperlipidemia   . Hypertension   . Hyperthyroidism   . Hypokalemia   . Obesity   . Rheumatoid arthritis(714.0)   . Stroke [facility]) 2013   no residual    Past Surgical History:  Procedure Laterality Date  . ABDOMINAL HYSTERECTOMY    . CARPAL TUNNEL RELEASE  Bilateral  . CHOLECYSTECTOMY    . CYSTOSCOPY WITH STENT PLACEMENT Bilateral 05/31/2017   Procedure: CYSTOSCOPY WITH STENT PLACEMENT- LIGHTED STENTS;  Surgeon: Hollice Espy, MD;  Location: [facility] ORS;  Service: Urology;  Laterality: Bilateral;  . ILEOSTOMY N/A 06/08/2017   Procedure: ILEOSTOMY;  Surgeon: Vickie Epley, MD;  Location: [place] [facility];  Service: General;  Laterality: N/A;  . LAPAROSCOPIC SIGMOID COLECTOMY N/A 05/31/2017   Procedure: LAPAROSCOPIC SIGMOID COLECTOMY;  Surgeon: Clayburn Pert, MD;  Location: [facility] ORS;  Service: General;  Laterality: N/A;  . LAPAROTOMY N/A 06/08/2017   Procedure: EXPLORATORY LAPAROTOMY;  Surgeon: Vickie Epley, MD;  Location: [facility] ORS;  Service: General;  Laterality: N/A;  . TEE WITHOUT CARDIOVERSION  07/26/2012   Procedure: TRANSESOPHAGEAL ECHOCARDIOGRAM (TEE);  Surgeon: Thayer Headings, MD;  Location: [place];  Service: Cardiovascular;  Laterality: N/A;  . TONSILLECTOMY    . TOTAL THYROIDECTOMY      Allergies: Adhesive [tape] and Latex  Medications: Prior to Admission medications   Medication Sig Start Date End Date Taking? Authorizing Provider  hydrochlorothiazide (MICROZIDE) 12.5 MG capsule Take 12.5 mg by mouth daily.  04/20/17  Yes [provider]  levothyroxine (SYNTHROID, LEVOTHROID) 112 MCG tablet Take 112 mcg by mouth daily before breakfast.   Yes [provider]  potassium chloride (K-DUR) 10 MEQ tablet  Take 2 tablets by mouth 2 (two) times daily with a meal.  03/02/17 03/02/18 Yes [provider]  acetaminophen (TYLENOL)  650 MG CR tablet Take 1,300 mg by mouth every 8 (eight) hours as needed for pain.    [provider]  ciprofloxacin (CIPRO) 500 MG tablet Take 1 tablet (500 mg total) by mouth 2 (two) times daily. 07/01/17   Pabon, Diego F, MD  HYDROcodone-acetaminophen (NORCO/VICODIN) 5-325 MG tablet Take 1-2 tablets by mouth every 6 (six) hours as needed for moderate pain. 06/20/17   Pabon, [place], MD  leflunomide (ARAVA) 20 MG tablet Take 20 mg by mouth daily.  04/08/17 10/05/17  [provider]  meloxicam (MOBIC) 15 MG tablet Take 15 mg by mouth daily.  02/04/17   [provider]  metroNIDAZOLE (FLAGYL) 500 MG tablet Take 1 tablet (500 mg total) by mouth 3 (three) times daily. 07/01/17   Pabon, [place], MD  nystatin (MYCOSTATIN/NYSTOP) powder Apply topically 4 (four) times daily. 06/23/17   Clayburn Pert, MD  pantoprazole (PROTONIX) 40 MG tablet Take 1 tablet (40 mg total) by mouth 2 (two) times daily. 07/01/17   Pabon, [place], MD  Tofacitinib Citrate (XELJANZ XR) 11 MG TB24 Take 1 tablet by mouth daily.    [provider]     Family History  Problem Relation Age of Onset  . Nephrolithiasis Mother   . Hypertension Mother   . Hypertension Father   . Heart failure Father   . Cardiomyopathy Brother     Social History   Social History  . Marital status: Married    Spouse name: N/A  . Number of children: N/A  . Years of education: N/A   Social History Main Topics  . Smoking status: Current Every Day Smoker    Packs/day: 0.25    Years: 23.00    Types: Cigarettes    Last attempt to quit: 07/23/2012  . Smokeless tobacco: Never Used  . Alcohol use No  . Drug use: No  . Sexual activity: Not Asked   Other Topics Concern  . None   Social History Narrative  . None    Review of Systems  Constitutional: Negative for fatigue and fever.  Respiratory: Negative for cough and shortness of breath.   Cardiovascular: Negative for chest pain.  Gastrointestinal:  Positive for abdominal pain.  Psychiatric/Behavioral: Negative for behavioral problems and confusion.    Vital Signs: BP 102/68 (BP Location: Left Arm)   Pulse 77   Temp 97.9 F (36.6 C) (Oral)   Resp 17   Ht 5\' 5"  (1.651 m)   Wt 155 lb 9.6 oz (70.6 kg)   SpO2 99%   BMI 25.89 kg/m   Physical Exam  Constitutional: She appears well-developed.  Cardiovascular: Normal rate, regular rhythm and normal heart sounds.   Pulmonary/Chest: Effort normal and breath sounds normal. No respiratory distress.  Abdominal: Soft. There is no tenderness.  ileostomy in place with increased output.  JP drain in LLQ as well as RLQ with light-colored, thick output.   Nursing note and vitals reviewed.   Imaging: Ct Abdomen Pelvis W Contrast  Result Date: 07/01/2017 CLINICAL DATA:  History of diverticulitis 2 weeks ago. Follow-up colostomy bag. Two drains. EXAM: CT ABDOMEN AND PELVIS WITH CONTRAST TECHNIQUE: Multidetector CT imaging of the abdomen and pelvis was performed using the standard protocol following bolus administration of intravenous contrast. CONTRAST:  13mL ISOVUE-300 IOPAMIDOL (ISOVUE-300) INJECTION 61% COMPARISON:  06/16/2017 and 06/08/2017 FINDINGS: Lower chest: Within normal.  Hepatobiliary: Stable 2 cm cyst over the left lobe of the liver. Previous cholecystectomy. Biliary tree is within normal. There are multiple hepatic subcapsular fluid collections with the largest junction of the right the left lobe measuring 2.3 x 7.6 cm. These are slightly worse compared to the prior study likely present subcapsular abscesses. Pancreas: Within normal. Spleen: Slight interval worsening of a subcapsular fluid collection likely abscess. Adrenals/Urinary Tract: Adrenal glands are normal. Kidneys are normal in size without hydronephrosis or nephrolithiasis. 1.3 cm cyst over the mid to lower pole left kidney unchanged. Ureters are within normal. Bladder is within normal. Stomach/Bowel: Stomach is normal evidence  patient's ileostomy site over the right lower quadrant unchanged and within normal. Terminal ileum is within normal. Appendix is not visualized. Surgical suture line over the rectosigmoid junction with subtle adjacent foci of air improved. The transverse, descending sigmoid colon are decompressed. Vascular/Lymphatic: Mild calcified plaque over the abdominal aorta and iliac arteries. No adenopathy. Reproductive: Previous hysterectomy. Other: Fluid collection containing small focus of air over the left paracolic gutter adjacent the descending colon with interval decrease in size measuring 2.4 x 2.9 cm (previously 3.3 x 4.2 cm). Bilateral lower abdominal/ pelvic drains are present without significant change. Collection of air and fluid with air-fluid level over the midline lower abdominal wall/ pelvic incision measuring 3.1 x 5 cm which may be infected versus noninfected postoperative collection. Musculoskeletal: Unchanged. IMPRESSION: History of diverticulitis post partial sigmoid colectomy with right lower quadrant ileostomy. Tiny extraluminal foci of air adjacent the rectosigmoid anastomosis improved. Improved air and fluid collection adjacent the distal descending colon in the left pericolic gutter measuring 2.4 x 2.9 cm likely a small abscess. Several hepatic subcapsular and splenic subcapsular fluid collections with slight interval worsening likely small abscesses. Two percutaneous drains with tips over the pelvis unchanged. Slight worsening air-fluid collection over the midline lower anterior abdominal wall/ pelvic incision measuring 3.1 x 5 cm which may be infected versus noninfected postop collection. Stable 2 cm hepatic cyst. 1.3 cm left renal cyst unchanged. Aortic Atherosclerosis (ICD10-I70.0). These results were called by telephone at the time of interpretation on 07/01/2017 at 2:14 pm to Dr. Rosana Hoes, who verbally acknowledged these results. Electronically Signed   By: Marin Olp M.D.   On: 07/01/2017  14:15   Ct Abdomen Pelvis W Contrast  Result Date: 06/16/2017 CLINICAL DATA:  Right upper quadrant abdominal pain. EXAM: CT ABDOMEN AND PELVIS WITH CONTRAST TECHNIQUE: Multidetector CT imaging of the abdomen and pelvis was performed using the standard protocol following bolus administration of intravenous contrast. CONTRAST:  164mL ISOVUE-300 IOPAMIDOL (ISOVUE-300) INJECTION 61% COMPARISON:  CT scan of June 08, 2017. FINDINGS: Lower chest: No acute abnormality. Hepatobiliary: Status post cholecystectomy. Stable left hepatic cyst is noted. There is interval development of subcapsular fluid collection overlying lateral and anterior aspect of right hepatic lobe, with several smaller subcapsular fluid collections along the inferior and medial aspect of right hepatic lobe posteriorly. These may represent small abscesses or old hematomas. Pancreas: Unremarkable. No pancreatic ductal dilatation or surrounding inflammatory changes. Spleen: Interval development of subcapsular fluid collection along the superior and lateral aspect of the spleen concerning for seroma or possibly abscess. Adrenals/Urinary Tract: Adrenal glands are unremarkable. Kidneys are normal, without renal calculi, focal lesion, or hydronephrosis. Bladder is unremarkable. Stomach/Bowel: Status post placement of ileostomy in right lower quadrant. Surgical anastomosis of distal sigmoid colon is noted ; small amount of gas is seen around this anastomosis which may be residual from prior surgery, although perforation cannot  be excluded. No abnormal bowel dilatation is noted. Mild wall thickening of small bowel loops is noted anteriorly in the pelvis suggesting inflammation. Stomach is unremarkable. Vascular/Lymphatic: Aortic atherosclerosis. No enlarged abdominal or pelvic lymph nodes. Reproductive: Status post hysterectomy. No adnexal masses. Other: 2 surgical drains are noted, both with tips in the anterior portion of the pelvis. No fluid collection  is noted around the tips of these drains. There is interval development of 3.9 x 3.3 cm fluid collection in left pericolic gutter concerning for abscess. 3.0 x 1.7 cm fluid collection is seen along left psoas muscle more inferiorly. Musculoskeletal: No acute or significant osseous findings. IMPRESSION: Status post placement of ileostomy in right lower quadrant. There is no evidence of bowel obstruction, although mild wall thickening is seen involving small bowel loops anteriorly in the pelvis consistent with inflammation. Small amount of gas is seen in the fat surrounding surgical anastomosis in distal sigmoid colon which may be residual from previous surgery, but persistent perforation cannot be excluded. Interval placement of 2 surgical drains in the pelvis, with no fluid collection around the tip of either catheter. Interval development of 3.9 x 3.3 cm fluid collection the left pericolic gutter, as well as another 3.0 x 1.7 cm fluid collection along left psoas muscle more inferiorly, concerning for small abscesses. Also noted are multiple subcapsular fluid collections involving the liver and spleen which may represent seromas or old hematomas, but abscesses cannot be excluded. Aortic atherosclerosis. Electronically Signed   By: Marijo Conception, M.D.   On: 06/16/2017 14:39   Ct Abdomen Pelvis W Contrast  Result Date: 06/08/2017 CLINICAL DATA:  Acute middle abdominal pain. EXAM: CT ABDOMEN AND PELVIS WITH CONTRAST TECHNIQUE: Multidetector CT imaging of the abdomen and pelvis was performed using the standard protocol following bolus administration of intravenous contrast. CONTRAST:  100 mL of Isovue-300 intravenously. COMPARISON:  CT scan of May 16, 2017. FINDINGS: Lower chest: No acute abnormality. Hepatobiliary: Status post cholecystectomy. Stable left hepatic cyst. Pancreas: Unremarkable. No pancreatic ductal dilatation or surrounding inflammatory changes. Spleen: Normal in size without focal  abnormality. Adrenals/Urinary Tract: Adrenal glands are unremarkable. Kidneys are normal, without renal calculi, focal lesion, or hydronephrosis. Bladder is unremarkable. Stomach/Bowel: The stomach appears normal. The appendix appears normal. Status post sigmoid colectomy. There is no evidence of bowel obstruction. There appears to be a large perforation at the anastomotic site between the residual colon and rectum best seen on image number 72 of series 2. There is stool and gas in this area, in pneumoperitoneum is noted throughout the abdomen. Mild wall thickening and inflammatory changes are seen involving small bowel loops in the pelvis most likely representing secondary inflammation. Vascular/Lymphatic: Aortic atherosclerosis is noted. Stable mildly enlarged retroperitoneal lymph nodes are noted which most likely are reactive in etiology. Reproductive: Status post hysterectomy. No adnexal masses. Other: Pneumoperitoneum is seen throughout the abdomen consistent with bowel perforation as described above. No abnormal hernia is noted. Musculoskeletal: No acute or significant osseous findings. IMPRESSION: Patient is status post recent sigmoid colectomy for diverticulitis. However, there appears to be a large perforation at the anastomotic site between the residual sigmoid colon and rectum in the pelvis, resulting in pneumoperitoneum throughout the abdomen and stool in the pelvis. Mild wall thickening inflammatory changes are seen involving small bowel loops in the pelvis most likely representing secondary inflammation. Critical Value/emergent results were called by telephone at the time of interpretation on 06/08/2017 at 3:23 pm to Dr. Alfred Levins, who verbally acknowledged these results. Electronically  Signed   By: Marijo Conception, M.D.   On: 06/08/2017 15:23    Labs:  CBC:  Recent Labs  07/01/17 1410 07/02/17 0407 07/04/17 0552 07/05/17 0327  WBC 12.5* 10.5 8.5 8.4  HGB 10.7* 10.1* 8.5* 7.9*  HCT 31.4*  29.3* 24.8* 23.3*  PLT 587* 503* 418 400    COAGS: No results for input(s): INR, APTT in the last 8760 hours.  BMP:  Recent Labs  07/02/17 0407 07/03/17 0311 07/04/17 0552 07/05/17 0327  NA 127* 128* 130* 131*  K 4.5 3.8 4.0 4.0  CL 97* 100* 105 108  CO2 22 19* 21* 18*  GLUCOSE 99 107* 95 106*  BUN 40* 30* 21* 17  CALCIUM 8.7* 8.1* 7.7* 7.6*  CREATININE 1.38* 0.99 1.02* 0.91  GFRNONAA 43* >60 >60 >60  GFRAA 50* >60 >60 >60    LIVER FUNCTION TESTS:  Recent Labs  06/08/17 1344 06/22/17 0918 07/01/17 1101 07/01/17 1410  BILITOT 1.1 0.8 0.8 0.8  AST 18 24 20 20   ALT 16 12* 14 14  ALKPHOS 84 126 130* 126  PROT 7.2 8.8* 8.6* 8.4*  ALBUMIN 3.1* 3.2* 3.4* 3.3*    TUMOR MARKERS: No results for input(s): AFPTM, CEA, CA199, CHROMGRNA in the last 8760 hours.  Assessment and Plan: Intra-abdominal fluid collections Patient with past medical history of perforated diverticulitis s/p colon resection and breakdown of the anastomosis requiring revision with ileostomy creation now presents with complaint of peri-hepatic and peri-splenic fluid collections identified by CT imaging.  IR consulted for possible aspiration and drainage at the request of Dr. Rosana Hoes. Reviewed case with Dr. Anselm Pancoast.  Patient currently afebrile and without elevation in WBC.  She is currently stable on PO Cipro and flagyl.  Discussed case with Dr. Adonis Huguenin.  Patient currently asymptomatic and seems to be improving clinically.  She is tolerating a regular diet.  Patient planning for repeat CT imaging in the next few days for assessment of status and drains.   Plan to follow imaging and status.  Per Dr. Anselm Pancoast fluid collections are amenable to drainage, if needed.  IR available. Note patient is on lovenox.  If drains warranted, would need to hold lovenox for 24 hrs prior to procedure.   Thank you for this interesting consult.  I greatly enjoyed meeting Rebecca Obrien and look forward to participating in their care.   A copy of this report was sent to the requesting provider on this date.  Electronically Signed: Docia Barrier, PA 07/05/2017, 11:23 AM   I spent a total of 40 Minutes    in face to face in clinical consultation, greater than 50% of which was counseling/coordinating care for intra-abdominal fluid collections.

## 2017-07-05 NOTE — Progress Notes (Signed)
CC: Ileostomy Subjective: Patient reports continuing to feel well.  Tolerating a diet.  Thinks her ileostomy is finally slowing down.  Denies any pain, nausea, vomiting.  Objective: Vital signs in last 24 hours: Temp:  [97.9 F (36.6 C)-98.3 F (36.8 C)] 97.9 F (36.6 C) (10/30 0455) Pulse Rate:  [77-88] 77 (10/30 0455) Resp:  [17-18] 17 (10/30 0455) BP: (88-107)/(53-68) 102/68 (10/30 0455) SpO2:  [99 %-100 %] 99 % (10/30 0455) Weight:  [70.6 kg (155 lb 9.6 oz)] 70.6 kg (155 lb 9.6 oz) (10/30 0455) Last BM Date: 07/04/17  Intake/Output from previous day: 10/29 0701 - 10/30 0700 In: 2216.1 [I.V.:2216.1] Out: 1785.5 [Urine:150; Drains:10.5; Stool:1625] Intake/Output this shift: Total I/O In: 715.1 [P.O.:240; I.V.:475.1] Out: -   Physical exam:  General: No acute distress Chest: Clear to auscultation Heart: Regular rate and rhythm Abdomen: Soft, nontender, nondistended.  JP drains with minimal purulent output.  Right upper quadrant loop ileostomy with continued output.  Lab Results: CBC   Recent Labs  07/04/17 0552 07/05/17 0327  WBC 8.5 8.4  HGB 8.5* 7.9*  HCT 24.8* 23.3*  PLT 418 400   BMET  Recent Labs  07/04/17 0552 07/05/17 0327  NA 130* 131*  K 4.0 4.0  CL 105 108  CO2 21* 18*  GLUCOSE 95 106*  BUN 21* 17  CREATININE 1.02* 0.91  CALCIUM 7.7* 7.6*   PT/INR No results for input(s): LABPROT, INR in the last 72 hours. ABG No results for input(s): PHART, HCO3 in the last 72 hours.  Invalid input(s): PCO2, PO2  Studies/Results: No results found.  Anti-infectives: Anti-infectives    Start     Dose/Rate Route Frequency Ordered Stop   07/01/17 2000  ciprofloxacin (CIPRO) tablet 500 mg     500 mg Oral 2 times daily 07/01/17 1730     07/01/17 1800  metroNIDAZOLE (FLAGYL) tablet 500 mg     500 mg Oral 3 times daily 07/01/17 1730        Assessment/Plan:  51 year old female admitted for electrolyte abnormality secondary to high output ileostomy.   Appears to finally be responding to Imodium therapy.  Encourage oral intake, ambulation, incentive spirometer.  Discussed again the need for continued antibiotics and drainage until the drain output is 0 mL over 24 hours.  [name] T. Adonis Huguenin, MD, [facility]  Day [facility] [phone] Night ASCOM [phone] 07/05/2017

## 2017-07-06 ENCOUNTER — Telehealth: Payer: Self-pay | Admitting: General Practice

## 2017-07-06 DIAGNOSIS — Z23 Encounter for immunization: Secondary | ICD-10-CM | POA: Diagnosis not present

## 2017-07-06 DIAGNOSIS — F419 Anxiety disorder, unspecified: Secondary | ICD-10-CM | POA: Diagnosis not present

## 2017-07-06 DIAGNOSIS — I10 Essential (primary) hypertension: Secondary | ICD-10-CM | POA: Diagnosis not present

## 2017-07-06 DIAGNOSIS — R1084 Generalized abdominal pain: Secondary | ICD-10-CM

## 2017-07-06 DIAGNOSIS — E43 Unspecified severe protein-calorie malnutrition: Secondary | ICD-10-CM | POA: Diagnosis not present

## 2017-07-06 DIAGNOSIS — M069 Rheumatoid arthritis, unspecified: Secondary | ICD-10-CM | POA: Diagnosis not present

## 2017-07-06 DIAGNOSIS — K651 Peritoneal abscess: Secondary | ICD-10-CM | POA: Diagnosis not present

## 2017-07-06 DIAGNOSIS — E785 Hyperlipidemia, unspecified: Secondary | ICD-10-CM | POA: Diagnosis not present

## 2017-07-06 DIAGNOSIS — E871 Hypo-osmolality and hyponatremia: Secondary | ICD-10-CM | POA: Diagnosis not present

## 2017-07-06 DIAGNOSIS — N179 Acute kidney failure, unspecified: Secondary | ICD-10-CM | POA: Diagnosis not present

## 2017-07-06 LAB — BASIC METABOLIC PANEL
ANION GAP: 2 — AB (ref 5–15)
BUN: 12 mg/dL (ref 6–20)
CALCIUM: 7.4 mg/dL — AB (ref 8.9–10.3)
CO2: 21 mmol/L — AB (ref 22–32)
Chloride: 112 mmol/L — ABNORMAL HIGH (ref 101–111)
Creatinine, Ser: 0.8 mg/dL (ref 0.44–1.00)
GFR calc Af Amer: >60 mL/min (ref >60)
GFR calc non Af Amer: >60 mL/min (ref >60)
Glucose, Bld: 79 mg/dL (ref 65–99)
POTASSIUM: 3.7 mmol/L (ref 3.5–5.1)
Sodium: 135 mmol/L (ref 135–145)

## 2017-07-06 MED ORDER — LORAZEPAM 0.5 MG PO TABS: 0.5000 mg | ORAL_TABLET | Freq: Two times a day (BID) | ORAL | 0 refills | Status: DC | PRN

## 2017-07-06 MED ORDER — LOPERAMIDE HCL 2 MG PO CAPS: 4.0000 mg | ORAL_CAPSULE | Freq: Three times a day (TID) | ORAL | 0 refills | Status: DC

## 2017-07-06 MED ORDER — INFLUENZA VAC SPLIT QUAD 0.5 ML IM SUSY
0.5000 mL | PREFILLED_SYRINGE | Freq: Once | INTRAMUSCULAR | Status: AC
  Administered 2017-07-06: 0.5 mL via INTRAMUSCULAR
  Filled 2017-07-06: qty 0.5

## 2017-07-06 MED ORDER — ADULT MULTIVITAMIN W/MINERALS CH: 1.0000 | ORAL_TABLET | Freq: Every day | ORAL | Status: DC

## 2017-07-06 MED ORDER — PREMIER PROTEIN SHAKE: 11.0000 [oz_av] | Freq: Two times a day (BID) | ORAL | 0 refills | Status: AC

## 2017-07-06 NOTE — Telephone Encounter (Signed)
Patients calling said she is schedule for an appt with Dr. Adonis Huguenin on 11/8. Patient stated Dr. Adonis Huguenin wanted her to have a ct the day before her appointment. Please call patient and advice.

## 2017-07-06 NOTE — Progress Notes (Signed)
Patient cleared for discharge by MD Benjie Karvonen and MD Adonis Huguenin. Patient drains and ileostomy emptied. IV removed. Discharge instructions explained. All questions answered. Prescriptions given. Belongings gathered. Volunteer called to wheel patient to visitors entrance to meet her mom.

## 2017-07-06 NOTE — Care Management (Signed)
Patient to discharge home today.  Sarah with [facility] notified of discharge. Requested resumption orders from MD

## 2017-07-06 NOTE — Consult Note (Signed)
[place] Nurse ostomy follow up Stoma type/location: RLQ Ileostomy  Mother at bedside today.  Mother and patient will be performing pouch change today.  Discussed at length the need for patient to learn self care and that mother assisting was a good idea.  They are both in agreement to do this with my help at this time.  Discussed that point of [facility] was teaching how to perform self care and that they soon may discharge services.  She will discuss with her [place].  Encouraged to try and perform the pouch changes with [facility] nurse help rather than let them perform.   Stomal assessment/size:  2.4 cm wide and 0.4 cm in length Peristomal skin is reddened but has healed.  Is protected with stoma powder and barrier ring  Treatment options for stomal/peristomal skin: barrier ring.  Os points downward.  Output liquid yellow stool.  Has thickened up some with Immodium Ostomy pouching: 1pc. Convex with barrier ring and ostomy belt.  Barrier cut off center to accommodate drain.  Education provided: patient removes belt and old pouch. She cleanses peristomal skin and continues to dab at effluent to keep skin clean.  Mother is able to trace pattern and cut the opening to fit.  She applies barrier ring to peristomal skin and applies the pouch.  Patient rolls pouch closed and applies ostomy belt.  Reminded to allow pouch system to warm to promote a secure fit.  Hoping to keep pouch in place for 3 days and then have a scheduled pouch change.  Enrolled patient in [place] Start Discharge program: Yes [place] team will follow.  Domenic Moras RN BSN [place] Pager [phone]

## 2017-07-06 NOTE — Telephone Encounter (Signed)
Called patient back to let her know that her CT Scan is scheduled to be done on 07/13/2017 at 3:00 PM but to be there at 2:45 PM at the [facility] (off [address]). Patient was told to be NPO 4 hours prior to her appointment. Patient was also told to pick up her prep kit at least one day prior to her appointment. Patient was also reminded of her appointment with Dr. Woodham on 07/14/2017. Patient agreed and had no further questions. 

## 2017-07-06 NOTE — Progress Notes (Signed)
CC: Ileostomy Subjective: Patient reports her ileostomy output has decreased over the last 24 hours.  Denies any abdominal pain.  Is tolerating a diet.  States she feels ready to go home.  Objective: Vital signs in last 24 hours: Temp:  [97.9 F (36.6 C)-99.5 F (37.5 C)] 99.5 F (37.5 C) (10/31 0516) Pulse Rate:  [82-91] 91 (10/31 0516) Resp:  [16-18] 18 (10/31 0516) BP: (102-108)/(64-68) 102/64 (10/31 0516) SpO2:  [99 %-100 %] 99 % (10/31 0516) Weight:  [72.7 kg (160 lb 4.8 oz)] 72.7 kg (160 lb 4.8 oz) (10/31 0409) Last BM Date: 07/04/17  Intake/Output from previous day: 10/30 0701 - 10/31 0700 In: 3036.1 [P.O.:770; I.V.:2266.1] Out: 1603 [Urine:600; Drains:3; Stool:1000] Intake/Output this shift: Total I/O In: 324 [I.V.:324] Out: 550 [Urine:200; Stool:350]  Physical exam:  General: No acute distress Chest: Clear to auscultation Heart: Rate and rhythm Abdomen: Soft, nontender, nondistended.  Well-healed midline incision.  Bilateral JP drains with clear liquid output today.  Loop ileostomy in the right upper quadrant that is pink, patent of gas and stool.  Lab Results: CBC   Recent Labs  07/04/17 0552 07/05/17 0327  WBC 8.5 8.4  HGB 8.5* 7.9*  HCT 24.8* 23.3*  PLT 418 400   BMET  Recent Labs  07/05/17 0327 07/06/17 0407  NA 131* 135  K 4.0 3.7  CL 108 112*  CO2 18* 21*  GLUCOSE 106* 79  BUN 17 12  CREATININE 0.91 0.80  CALCIUM 7.6* 7.4*   PT/INR No results for input(s): LABPROT, INR in the last 72 hours. ABG No results for input(s): PHART, HCO3 in the last 72 hours.  Invalid input(s): PCO2, PO2  Studies/Results: No results found.  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  Doing much better today.  Likely be discharged home later today.  Discussed that we would obtain an outpatient CT scan next week prior to her being seen by me in clinic to evaluate whether or not her JP drains can be removed.  Encouraged oral intake and the use of electrolyte replacement solutions such as Gatorade.  Follow-up appointment with surgery as already scheduled for next Thursday.  Pennelope Basque T. Adonis Huguenin, MD, [facility]  Day ASC[facility] [phone] Night ASCOM [phone] 07/06/2017

## 2017-07-06 NOTE — Discharge Summary (Signed)
[place] [facility] [place] NAME: Rebecca Obrien    MR#:  [id number]  DATE OF BIRTH:  11-30-1965  DATE OF ADMISSION:  07/01/2017 ADMITTING PHYSICIAN: Bettey Costa, MD  DATE OF DISCHARGE: 07/06/2017  PRIMARY CARE PHYSICIAN: Marguerita Merles, MD    ADMISSION DIAGNOSIS:  Dehydration [E86.0] Hyperkalemia [E87.5] Hyponatremia [E87.1]  DISCHARGE DIAGNOSIS:  Active Problems:   Acute kidney injury ([place])   Pressure injury of skin   Dehydration   Intraperitoneal abscess ([place])   SECONDARY DIAGNOSIS:   Past Medical History:  Diagnosis Date  . Arthritis    RA  . Cancer ([place])    skin melanoma  . Collagen vascular disease (H[facility])    RA  . Difficult airway   . 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 Diverticulitis of sigmoid colon   . History of kidney stones   . Hyperlipidemia   . Hypertension   . Hyperthyroidism   . Hypokalemia   . Obesity   . Rheumatoid arthritis(714.0)   . Stroke [facility]) 2013   no residual    HOSPITAL COURSE:  51 year old female with history of diverticulitis status post perforation and now with ileostomy who presented for outpatient routine follow-up and found to have hyperkalemia and hyponatremia.  1. Hyponatremia: This is from dehydration from increased ileostomy output Her sodium level has improved Continue to hold hydrochlorothiazide  2. Hyperkalemia from acute kidney injury and potassium supplements Stopped  potassium supplements Potassium is within normal limits   3. Acute kidney injury in the setting of dehydration from increased ileostomy output Creatinine has improved with IV fluids    4. Status post ileostomy and drain placement with CT results as stated above Surgery consultation appreciated IR consult to evaluate liver abscess as per surgery consultation.  Continue scheduled Imodium to decrease ileostomy output Per IR consult,  planning for repeat CT imaging in  the next few days for assessment of status and drains.   5. Essential hypertension: HCTZ discontinued for now due to hyponatremia and low/normal blood pressure  6. GERD: Continue PPI  7.  Anxiety: Continue as needed Ativan   8.  Hypomagnesemia. Given IV magnesium and this has improved.   DISCHARGE CONDITIONS AND DIET:   Stable for discharge on regular diet  CONSULTS OBTAINED:  Treatment Team:  Vickie Epley, MD  DRUG ALLERGIES:   Allergies  Allergen Reactions  . Adhesive [Tape]     Skin breakdown/dermatitis with prolonged use   . Latex Dermatitis    With prolonged use  NOT LATEX ALLERGIC.  Patient has problem with adhesive, not latex    DISCHARGE MEDICATIONS:   Current Discharge Medication List    START taking these medications   Details  loperamide (IMODIUM) 2 MG capsule Take 2 capsules (4 mg total) by mouth 3 (three) times daily. Qty: 30 capsule, Refills: 0    LORazepam (ATIVAN) 0.5 MG tablet Take 1 tablet (0.5 mg total) by mouth 2 (two) times daily as needed for anxiety. Qty: 20 tablet, Refills: 0    Multiple Vitamin (MULTIVITAMIN WITH MINERALS) TABS tablet Take 1 tablet by mouth daily.    protein supplement shake (PREMIER PROTEIN) LIQD Take 325 mLs (11 oz total) by mouth 2 (two) times daily between meals. Qty: 19500 mL, Refills: 0      CONTINUE these medications which have NOT CHANGED   Details  levothyroxine (SYNTHROID, LEVOTHROID) 112 MCG tablet Take 112 mcg by mouth daily  before breakfast.    acetaminophen (TYLENOL) 650 MG CR tablet Take 1,300 mg by mouth every 8 (eight) hours as needed for pain.    ciprofloxacin (CIPRO) 500 MG tablet Take 1 tablet (500 mg total) by mouth 2 (two) times daily. Qty: 28 tablet, Refills: 0    HYDROcodone-acetaminophen (NORCO/VICODIN) 5-325 MG tablet Take 1-2 tablets by mouth every 6 (six) hours as needed for moderate pain. Qty: 30 tablet, Refills: 0    metroNIDAZOLE (FLAGYL) 500 MG tablet Take 1 tablet (500 mg  total) by mouth 3 (three) times daily. Qty: 42 tablet, Refills: 0    nystatin (MYCOSTATIN/NYSTOP) powder Apply topically 4 (four) times daily. Qty: 45 g, Refills: 0    pantoprazole (PROTONIX) 40 MG tablet Take 1 tablet (40 mg total) by mouth 2 (two) times daily. Qty: 60 tablet, Refills: 0      STOP taking these medications     hydrochlorothiazide (MICROZIDE) 12.5 MG capsule      potassium chloride (K-DUR) 10 MEQ tablet      leflunomide (ARAVA) 20 MG tablet      meloxicam (MOBIC) 15 MG tablet      Tofacitinib Citrate (XELJANZ XR) 11 MG TB24           Today   CHIEF COMPLAINT:   Patient doing very well this morning.  No acute issues overnight.   VITAL SIGNS:  Blood pressure 102/64, pulse 91, temperature 99.5 F (37.5 C), temperature source Oral, resp. rate 18, height 5\' 5"  (1.651 m), weight 72.7 kg (160 lb 4.8 oz), SpO2 99 %.   REVIEW OF SYSTEMS:  Review of Systems  Constitutional: Negative.  Negative for chills, fever and malaise/fatigue.  HENT: Negative.  Negative for ear discharge, ear pain, hearing loss, nosebleeds and sore throat.   Eyes: Negative.  Negative for blurred vision and pain.  Respiratory: Negative.  Negative for cough, hemoptysis, shortness of breath and wheezing.   Cardiovascular: Negative.  Negative for chest pain, palpitations and leg swelling.  Gastrointestinal: Negative.  Negative for abdominal pain, blood in stool, diarrhea, nausea and vomiting.  Genitourinary: Negative.  Negative for dysuria.  Musculoskeletal: Negative.  Negative for back pain.  Skin: Negative.   Neurological: Negative for dizziness, tremors, speech change, focal weakness, seizures and headaches.  Endo/Heme/Allergies: Negative.  Does not bruise/bleed easily.  Psychiatric/Behavioral: Negative.  Negative for depression, hallucinations and suicidal ideas.     PHYSICAL EXAMINATION:  GENERAL:  51 y.o.-year-old patient lying in the bed with no acute distress.  NECK:  Supple,  no jugular venous distention. No thyroid enlargement, no tenderness.  LUNGS: Normal breath sounds bilaterally, no wheezing, rales,rhonchi  No use of accessory muscles of respiration.  CARDIOVASCULAR: S1, S2 normal. No murmurs, rubs, or gallops.  ABDOMEN: Soft, non-tender, non-distended. Bowel sounds present. No organomegaly or mass.  She has ileostomy bag in 2 JP drains with very minimal drainage EXTREMITIES: No pedal edema, cyanosis, or clubbing.  PSYCHIATRIC: The patient is alert and oriented x 3.  SKIN: No obvious rash, lesion, or ulcer.   DATA REVIEW:   CBC  Recent Labs Lab 07/05/17 0327  WBC 8.4  HGB 7.9*  HCT 23.3*  PLT 400    Chemistries   Recent Labs Lab 07/01/17 1410  07/05/17 0327 07/06/17 0407  NA 123*  < > 131* 135  K 5.5*  < > 4.0 3.7  CL 89*  < > 108 112*  CO2 21*  < > 18* 21*  GLUCOSE 96  < > 106* 79  BUN 49*  < > 17 12  CREATININE 1.38*  < > 0.91 0.80  CALCIUM 9.4  < > 7.6* 7.4*  MG  --   < > 2.0  --   AST 20  --   --   --   ALT 14  --   --   --   ALKPHOS 126  --   --   --   BILITOT 0.8  --   --   --   < > = values in this interval not displayed.  Cardiac Enzymes No results for input(s): TROPONINI in the last 168 hours.  Microbiology Results  @MICRORSLT48 @  RADIOLOGY:  No results found.    Current Discharge Medication List    START taking these medications   Details  loperamide (IMODIUM) 2 MG capsule Take 2 capsules (4 mg total) by mouth 3 (three) times daily. Qty: 30 capsule, Refills: 0    LORazepam (ATIVAN) 0.5 MG tablet Take 1 tablet (0.5 mg total) by mouth 2 (two) times daily as needed for anxiety. Qty: 20 tablet, Refills: 0    Multiple Vitamin (MULTIVITAMIN WITH MINERALS) TABS tablet Take 1 tablet by mouth daily.    protein supplement shake (PREMIER PROTEIN) LIQD Take 325 mLs (11 oz total) by mouth 2 (two) times daily between meals. Qty: 19500 mL, Refills: 0      CONTINUE these medications which have NOT CHANGED   Details   levothyroxine (SYNTHROID, LEVOTHROID) 112 MCG tablet Take 112 mcg by mouth daily before breakfast.    acetaminophen (TYLENOL) 650 MG CR tablet Take 1,300 mg by mouth every 8 (eight) hours as needed for pain.    ciprofloxacin (CIPRO) 500 MG tablet Take 1 tablet (500 mg total) by mouth 2 (two) times daily. Qty: 28 tablet, Refills: 0    HYDROcodone-acetaminophen (NORCO/VICODIN) 5-325 MG tablet Take 1-2 tablets by mouth every 6 (six) hours as needed for moderate pain. Qty: 30 tablet, Refills: 0    metroNIDAZOLE (FLAGYL) 500 MG tablet Take 1 tablet (500 mg total) by mouth 3 (three) times daily. Qty: 42 tablet, Refills: 0    nystatin (MYCOSTATIN/NYSTOP) powder Apply topically 4 (four) times daily. Qty: 45 g, Refills: 0    pantoprazole (PROTONIX) 40 MG tablet Take 1 tablet (40 mg total) by mouth 2 (two) times daily. Qty: 60 tablet, Refills: 0      STOP taking these medications     hydrochlorothiazide (MICROZIDE) 12.5 MG capsule      potassium chloride (K-DUR) 10 MEQ tablet      leflunomide (ARAVA) 20 MG tablet      meloxicam (MOBIC) 15 MG tablet      Tofacitinib Citrate (XELJANZ XR) 11 MG TB24            Management plans discussed with the patient and she is in agreement. Stable for discharge home  Patient should follow up with surgery  CODE STATUS:     Code Status Orders        Start     Ordered   07/01/17 1731  Full code  Continuous     07/01/17 1730    Code Status History    Date Active Date Inactive Code Status Order ID Comments User Context   06/09/2017  2:37 AM 06/20/2017  8:16 PM Full Code [id number]  Vickie Epley, MD Inpatient   05/31/2017  3:18 PM 06/03/2017  4:13 PM Full Code [id number]  Clayburn Pert, MD Inpatient   05/03/2017  9:03 PM 05/07/2017  1:26  PM Full Code [id number]  Vickie Epley, MD Inpatient   04/27/2017  4:54 PM 04/30/2017  7:49 PM Full Code [id number]  Clayburn Pert, MD Inpatient      TOTAL TIME TAKING CARE OF THIS PATIENT: 38  minutes.    Note: This dictation was prepared with Dragon dictation along with smaller phrase technology. Any transcriptional errors that result from this process are unintentional.  Maedell Hedger M.D on 07/06/2017 at 9:28 AM  Between 7am to 6pm - Pager - [phone] After 6pm go to [email or web address] - password [email or web address] [place] [facility]  Office  [phone]  CC: Primary care physician; Marguerita Merles, MD

## 2017-07-07 ENCOUNTER — Encounter: Payer: Self-pay | Admitting: Surgery

## 2017-07-08 ENCOUNTER — Telehealth: Payer: Self-pay | Admitting: Surgery

## 2017-07-08 ENCOUNTER — Other Ambulatory Visit: Payer: Self-pay

## 2017-07-08 ENCOUNTER — Other Ambulatory Visit: Payer: Self-pay | Admitting: *Deleted

## 2017-07-08 DIAGNOSIS — M0579 Rheumatoid arthritis with rheumatoid factor of multiple sites without organ or systems involvement: Secondary | ICD-10-CM | POA: Diagnosis not present

## 2017-07-08 DIAGNOSIS — T8132XD Disruption of internal operation (surgical) wound, not elsewhere classified, subsequent encounter: Secondary | ICD-10-CM | POA: Diagnosis not present

## 2017-07-08 DIAGNOSIS — Z79891 Long term (current) use of opiate analgesic: Secondary | ICD-10-CM | POA: Diagnosis not present

## 2017-07-08 DIAGNOSIS — Z8673 Personal history of transient ischemic attack (TIA), and cerebral infarction without residual deficits: Secondary | ICD-10-CM | POA: Diagnosis not present

## 2017-07-08 DIAGNOSIS — Z4803 Encounter for change or removal of drains: Secondary | ICD-10-CM | POA: Diagnosis not present

## 2017-07-08 DIAGNOSIS — F17210 Nicotine dependence, cigarettes, uncomplicated: Secondary | ICD-10-CM | POA: Diagnosis not present

## 2017-07-08 DIAGNOSIS — M545 Low back pain: Secondary | ICD-10-CM | POA: Diagnosis not present

## 2017-07-08 DIAGNOSIS — Z432 Encounter for attention to ileostomy: Secondary | ICD-10-CM | POA: Diagnosis not present

## 2017-07-08 NOTE — Telephone Encounter (Signed)
Updated FMLA form has been faxed with return date--to [phone].

## 2017-07-08 NOTE — Patient Outreach (Signed)
[facility]) Care Management  07/08/2017  Rebecca Obrien [birth year] [id number]   Subjective:Telephone call to patient's home  / mobile number, no answer, left HIPAA compliant voicemail message, and requested call back.   Objective: Per chart review, patient hospitalized 07/01/17 -07/06/17 for dehydration.  Patient has ED visit on 06/22/17 for Postoperative pain and ostomy leaking. Patient hospitalized 06/08/17 - 06/20/17 for Large intestine anastomotic leak. Status post Exploratory laparotomy, washout of stool/abscess, Placement of pelvic 33F Blake drains x 2, Creation of diverting loop ileostomy on 06/09/17. Patient hospitalized 05/2517 -05/2817 for persistent diverticulitis. Status post Laparoscopic sigmoid colectomy with immediate anastomosis, mobilization of splenic flexure on 05/31/17. Patient hospitalized 05/03/17 - 05/07/17 for Diverticulitis of intestine with abscess. Patient hospitalized 04/27/17 -04/30/17 for Diverticulitis with microperforation. Patient also has a history of Rheumatoid arthritis, hypertension, hyperlipidemia, skin melanomaand stroke.    Assessment:Received [facility] Transition of care referral on 05/27/17. Transition of care follow up pending patient contact.    Plan: RNCM has send unsuccessful outreach  letter, [facility] pamphlet, and proceed with case closure, within 10 business days if no return call.       Rebecca Obrien H. [name], BSN, [place] Management [facility] Telephonic CM Phone: [phone] Fax: [phone]

## 2017-07-11 ENCOUNTER — Other Ambulatory Visit: Payer: Self-pay | Admitting: *Deleted

## 2017-07-11 NOTE — Patient Outreach (Signed)
[facility]) Care Management  07/11/2017  Rebecca Obrien Mar 20, 1966 [id number]   No response from patient outreach attempts will proceed with case closure.    Objective: Per chart review, patient hospitalized 07/01/17 -07/06/17 for dehydration.  Patient has ED visit on 06/22/17 for Postoperative pain and ostomy leaking. Patient hospitalized 06/08/17 - 06/20/17 for Large intestine anastomotic leak. Status post Exploratory laparotomy, washout of stool/abscess, Placement of pelvic 67F Blake drains x 2, Creation of diverting loop ileostomy on 06/09/17. Patient hospitalized 05/2517 -05/2817 for persistent diverticulitis. Status post Laparoscopic sigmoid colectomy with immediate anastomosis, mobilization of splenic flexure on 05/31/17. Patient hospitalized 05/03/17 - 05/07/17 for Diverticulitis of intestine with abscess. Patient hospitalized 04/27/17 -04/30/17 for Diverticulitis with microperforation. Patient also has a history of Rheumatoid arthritis, hypertension, hyperlipidemia, skin melanomaand stroke.    Assessment:Received [facility]MR Transition of care referral on 05/27/17. Transition of care follow up not completed due to unable to contact patient and will proceed with case closure.     Plan: RNCM will send case closure due to unable to reach request to [facility] [place] [facility].       [place] Espina H. [name], BSN, [place] Management [facility] Telephonic CM Phone: [phone] Fax: [phone]

## 2017-07-12 DIAGNOSIS — T8132XD Disruption of internal operation (surgical) wound, not elsewhere classified, subsequent encounter: Secondary | ICD-10-CM | POA: Diagnosis not present

## 2017-07-12 DIAGNOSIS — Z432 Encounter for attention to ileostomy: Secondary | ICD-10-CM | POA: Diagnosis not present

## 2017-07-12 DIAGNOSIS — Z4803 Encounter for change or removal of drains: Secondary | ICD-10-CM | POA: Diagnosis not present

## 2017-07-12 DIAGNOSIS — M0579 Rheumatoid arthritis with rheumatoid factor of multiple sites without organ or systems involvement: Secondary | ICD-10-CM | POA: Diagnosis not present

## 2017-07-12 DIAGNOSIS — Z79891 Long term (current) use of opiate analgesic: Secondary | ICD-10-CM | POA: Diagnosis not present

## 2017-07-12 DIAGNOSIS — Z8673 Personal history of transient ischemic attack (TIA), and cerebral infarction without residual deficits: Secondary | ICD-10-CM | POA: Diagnosis not present

## 2017-07-12 DIAGNOSIS — F17210 Nicotine dependence, cigarettes, uncomplicated: Secondary | ICD-10-CM | POA: Diagnosis not present

## 2017-07-12 DIAGNOSIS — M545 Low back pain: Secondary | ICD-10-CM | POA: Diagnosis not present

## 2017-07-13 ENCOUNTER — Ambulatory Visit
Admission: RE | Admit: 2017-07-13 | Discharge: 2017-07-13 | Disposition: A | Discharge status: Home / Self Care | Payer: 59 | Source: Ambulatory Visit | Attending: General Surgery | Admitting: General Surgery

## 2017-07-13 DIAGNOSIS — N281 Cyst of kidney, acquired: Secondary | ICD-10-CM | POA: Insufficient documentation

## 2017-07-13 DIAGNOSIS — K7689 Other specified diseases of liver: Secondary | ICD-10-CM | POA: Insufficient documentation

## 2017-07-13 DIAGNOSIS — R1084 Generalized abdominal pain: Secondary | ICD-10-CM | POA: Diagnosis present

## 2017-07-13 DIAGNOSIS — Z9049 Acquired absence of other specified parts of digestive tract: Secondary | ICD-10-CM | POA: Insufficient documentation

## 2017-07-13 DIAGNOSIS — I700 Atherosclerosis of aorta: Secondary | ICD-10-CM | POA: Insufficient documentation

## 2017-07-13 MED ORDER — IOPAMIDOL (ISOVUE-300) INJECTION 61%
100.0000 mL | Freq: Once | INTRAVENOUS | Status: AC | PRN
  Administered 2017-07-13: 100 mL via INTRAVENOUS

## 2017-07-14 ENCOUNTER — Ambulatory Visit (INDEPENDENT_AMBULATORY_CARE_PROVIDER_SITE_OTHER): Payer: 59 | Admitting: General Surgery

## 2017-07-14 ENCOUNTER — Encounter: Payer: Self-pay | Admitting: General Surgery

## 2017-07-14 ENCOUNTER — Other Ambulatory Visit: Payer: Self-pay | Admitting: *Deleted

## 2017-07-14 VITALS — BP 130/84 | HR 125 | Temp 97.5°F | Ht 65.0 in | Wt 147.0 lb

## 2017-07-14 DIAGNOSIS — Z4889 Encounter for other specified surgical aftercare: Secondary | ICD-10-CM

## 2017-07-14 MED ORDER — CIPROFLOXACIN HCL 500 MG PO TABS: 500.0000 mg | ORAL_TABLET | Freq: Two times a day (BID) | ORAL | 0 refills | Status: DC

## 2017-07-14 NOTE — Patient Instructions (Signed)
Today we have removed your drains. These are may still drain for a few days. You may shower tomorrow.  Remove bandages before showering Allow soap and water to run over area [place] area dry and replace bandages  If you do not see any drainage after Saturday you do not need to keep theses area covered.  Continue your antibiotics for another week, we will send in a refill to your pharmacy.  Call us when you are ready to schedule for next week with Dr. Burt Knack or Dr. Geralyn Flash will follow up with Dr. Adonis Huguenin as listed below.  You're doing great! However if you have any questions or concerns please give our office a call.

## 2017-07-14 NOTE — Patient Outreach (Signed)
[facility]) Care Management  07/14/2017  Rebecca Obrien July 19, 1966 [id number]   Subjective: Case discussed during difficult case discussion with [place] Management team.   Patient has had 5 admissions and 1 ED visit, within the last 6 months . Patient is aware of available community resources.   No additional recommendations at this time.     Objective: Per chart review,patient hospitalized 07/01/17 -07/06/17 for dehydration.Patient has ED visit on 06/22/17 for Postoperative pain and ostomy leaking. Patient hospitalized 06/08/17 - 06/20/17 for Large intestine anastomotic leak. Status post Exploratory laparotomy, washout of stool/abscess, Placement of pelvic 76F Blake drains x 2, Creation of diverting loop ileostomy on 06/09/17. Patient hospitalized 05/2517 -05/2817 for persistent diverticulitis. Status post Laparoscopic sigmoid colectomy with immediate anastomosis, mobilization of splenic flexure on 05/31/17. Patient hospitalized 05/03/17 - 05/07/17 for Diverticulitis of intestine with abscess. Patient hospitalized 04/27/17 -04/30/17 for Diverticulitis with microperforation. Patient also has a history of Rheumatoid arthritis, hypertension, hyperlipidemia, skin melanomaand stroke.    Assessment:Received [facility]MR Transition of care referral on 05/27/17. Transition of care follow up not completed due to unable to contact patient and case remain closed.      Plan: Case will remain closed.       Hattie Aguinaldo H. [name], BSN, [place] Management [facility] Telephonic CM Phone: [phone] Fax: [phone]

## 2017-07-14 NOTE — Progress Notes (Signed)
Outpatient Surgical Follow Up  07/14/2017  Rebecca Obrien is an 51 y.o. female.   Chief Complaint  Patient presents with  . Routine Post Op    post op: Exploratory Laparotomy 06/08/2017 Dr. Rosana Hoes and Laparoscopic Sigmoid Colectomy 05/31/2017 Dr. Caroline Sauger over CT Scan results    HPI: 51 year old female returns to clinic for follow-up now 1 month status post loop ileostomy creation and drainage of pelvic abscess.  She is one-week post discharge from recent hospitalization secondary to dehydration from her ileostomy.  She reports that the output has remained decreased as it was when she was discharged.  She has a gradual increase in her energy level.  She denies any fevers, chills, nausea, vomiting, chest pain, shortness of breath.  She states the drain output from her 2 JP drains has been nothing for the last 3 days.  She had a CT scan prior to being seen in clinic today.  Past Medical History:  Diagnosis Date  . Arthritis    RA  . Cancer ([place])    skin melanoma  . Collagen vascular disease (HCC)    RA  . Difficult airway   . 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 Diverticulitis of sigmoid colon   . History of kidney stones   . Hyperlipidemia   . Hypertension   . Hyperthyroidism   . Hypokalemia   . Obesity   . Rheumatoid arthritis(714.0)   . Stroke [facility]) 2013   no residual    Past Surgical History:  Procedure Laterality Date  . ABDOMINAL HYSTERECTOMY    . CARPAL TUNNEL RELEASE  Bilateral  . CHOLECYSTECTOMY    . TONSILLECTOMY    . TOTAL THYROIDECTOMY      Family History  Problem Relation Age of Onset  . Nephrolithiasis Mother   . Hypertension Mother   . Hypertension Father   . Heart failure Father   . Cardiomyopathy Brother     Social History:  reports that she has been smoking cigarettes.  She has a 5.75 pack-year smoking history. she has never used smokeless tobacco. She reports that she does not drink alcohol or use  drugs.  Allergies:  Allergies  Allergen Reactions  . Adhesive [Tape]     Skin breakdown/dermatitis with prolonged use   . Latex Dermatitis    With prolonged use  NOT LATEX ALLERGIC.  Patient has problem with adhesive, not latex    Medications reviewed.    ROS Multipoint review of systems was completed, all pertinent positives and negatives are documented within the HPI and the remainder are negative   BP 130/84   Pulse (!) 125   Temp (!) 97.5 F (36.4 C) (Oral)   Ht 5\' 5"  (1.651 m)   Wt 66.7 kg (147 lb)   BMI 24.46 kg/m   Physical Exam General: No acute distress Chest: Clear to all station Heart: Tachycardic Abdomen: Soft, nontender, nondistended.  Loop ileostomy in the right upper quadrant is patent of gas and stool.  Bilateral JP drains with no output in the bulb.  Midline wound is well approximated and healed without any evidence of erythema or drainage.    No results found for this or any previous visit (from the past 48 hour(s)). Ct Abdomen Pelvis W Contrast  Result Date: 07/14/2017 CLINICAL DATA:  Surgery for diverticulitis 05/11/2017 with subsequent surgery 06/08/2017. EXAM: CT ABDOMEN AND PELVIS WITH CONTRAST TECHNIQUE: Multidetector CT imaging of the abdomen and pelvis was performed using the  standard protocol following bolus administration of intravenous contrast. CONTRAST:  148mL ISOVUE-300 IOPAMIDOL (ISOVUE-300) INJECTION 61% COMPARISON:  07/01/2017 FINDINGS: Lower chest: Within normal. Hepatobiliary: Stable 2.1 cm cyst over the lateral segment left lobe. Several subcapsular fluid collections with the largest over the lateral midportion of the liver with slight decrease in size measuring 2 x 6.3 cm (previously 2.3 x 7.6 cm). The other smaller subcapsular collections are slightly smaller compared to the recent previous exam. Previous cholecystectomy. Biliary tree is within normal. Pancreas: Within normal. Spleen: Stable moderate size subcapsular fluid collection  extending towards the left pericolic gutter. Adrenals/Urinary Tract: Adrenal glands are normal. Kidneys are normal in size without hydronephrosis or nephrolithiasis. 1.2 cm cyst over the mid pole left kidney unchanged. Ureters and bladder are normal. Stomach/Bowel: Stomach and small bowel are within normal. Two loops of small bowel extend into an ostomy site over the right lower quadrant unchanged. Appendix not visualized. Anastomotic site at the rectosigmoid junction intact. Previously noted small extraluminal air collection adjacent the anastomosis no longer visualized. Vascular/Lymphatic: Mild calcified plaque over the abdominal aorta. No adenopathy. Reproductive: Previous hysterectomy. Other: Several small rim enhancing fluid collections adjacent the descending colon with slight interval decrease in size. Slight increase in size of a fluid collection posterolateral to the left psoas muscle measuring 2 x 4.6 cm (previously 1.5 x 4 cm). Enlarging fluid collection with small foci of air over the lower midline abdominal incision site measuring 3.6 x 6.8 cm (previously 3.1 x 5 cm). To percutaneous drains within the pelvis unchanged. Musculoskeletal: Unchanged. IMPRESSION: Evidence of previous partial colectomy with rectosigmoid anastomosis. Resolution of previously seen small extraluminal air collection adjacent the anastomosis. Ostomy site over the right lower quadrant unchanged. Multiple subcapsular fluid collections over the liver and spleen as well as within the abdomen predominately over the pericolic gutters with overall interval decrease in size of these collections as described. Slight interval increase in size of a fluid collection posterolateral to the left psoas muscle measuring 2 x 4.6 cm (previously 1.5 x 4 cm). Interval increase in size of a predominant fluid collection with small foci of air over the lower midline abdominal wall incision measuring 3.6 x 6.8 cm (previously 3.1 x 5 cm). Two percutaneous  surgical drains over the pelvis unchanged. 2 cm left liver cyst unchanged. 1.2 cm left renal cyst. Aortic Atherosclerosis (ICD10-I70.0). Electronically Signed   By: Marin Olp M.D.   On: 07/14/2017 08:37    Assessment/Plan:  1. Aftercare following surgery 51 year old female status post drainage of pelvic abscess secondary to anastomotic disruption of a rectosigmoid anastomosis.  Loop ileostomy in place is functioning well.  JP drains removed today in clinic without any difficulty.  Reviewed the CT findings with the patient that showed improvement in all of her fluid collections with the exception of the superficial wound area.  All discussed given that there is no outward signs of infection that I would not open this or drain this in clinic today.  Counseled her to continue her antibiotics for at least another week even though her drains were removed today.  She will follow-up in clinic next week for additional wound check and abdominal exam.  I will see her back in clinic next month to schedule her barium enema for January assuming she continues to clinically do well.     Clayburn Pert, MD FACS General Surgeon  07/14/2017,10:04 AM

## 2017-07-15 DIAGNOSIS — Z432 Encounter for attention to ileostomy: Secondary | ICD-10-CM | POA: Diagnosis not present

## 2017-07-15 DIAGNOSIS — M545 Low back pain: Secondary | ICD-10-CM | POA: Diagnosis not present

## 2017-07-15 DIAGNOSIS — Z4803 Encounter for change or removal of drains: Secondary | ICD-10-CM | POA: Diagnosis not present

## 2017-07-15 DIAGNOSIS — Z79891 Long term (current) use of opiate analgesic: Secondary | ICD-10-CM | POA: Diagnosis not present

## 2017-07-15 DIAGNOSIS — F17210 Nicotine dependence, cigarettes, uncomplicated: Secondary | ICD-10-CM | POA: Diagnosis not present

## 2017-07-15 DIAGNOSIS — M0579 Rheumatoid arthritis with rheumatoid factor of multiple sites without organ or systems involvement: Secondary | ICD-10-CM | POA: Diagnosis not present

## 2017-07-15 DIAGNOSIS — Z8673 Personal history of transient ischemic attack (TIA), and cerebral infarction without residual deficits: Secondary | ICD-10-CM | POA: Diagnosis not present

## 2017-07-15 DIAGNOSIS — T8132XD Disruption of internal operation (surgical) wound, not elsewhere classified, subsequent encounter: Secondary | ICD-10-CM | POA: Diagnosis not present

## 2017-07-18 ENCOUNTER — Other Ambulatory Visit: Payer: Self-pay

## 2017-07-18 DIAGNOSIS — F17210 Nicotine dependence, cigarettes, uncomplicated: Secondary | ICD-10-CM | POA: Diagnosis not present

## 2017-07-18 DIAGNOSIS — Z5111 Encounter for antineoplastic chemotherapy: Secondary | ICD-10-CM | POA: Diagnosis not present

## 2017-07-18 DIAGNOSIS — Z5112 Encounter for antineoplastic immunotherapy: Secondary | ICD-10-CM | POA: Diagnosis not present

## 2017-07-18 DIAGNOSIS — C4372 Malignant melanoma of left lower limb, including hip: Secondary | ICD-10-CM | POA: Diagnosis not present

## 2017-07-18 DIAGNOSIS — C439 Malignant melanoma of skin, unspecified: Secondary | ICD-10-CM | POA: Diagnosis not present

## 2017-07-18 DIAGNOSIS — Z79891 Long term (current) use of opiate analgesic: Secondary | ICD-10-CM | POA: Diagnosis not present

## 2017-07-18 DIAGNOSIS — M0579 Rheumatoid arthritis with rheumatoid factor of multiple sites without organ or systems involvement: Secondary | ICD-10-CM | POA: Diagnosis not present

## 2017-07-18 DIAGNOSIS — T8132XD Disruption of internal operation (surgical) wound, not elsewhere classified, subsequent encounter: Secondary | ICD-10-CM | POA: Diagnosis not present

## 2017-07-18 DIAGNOSIS — Z4803 Encounter for change or removal of drains: Secondary | ICD-10-CM | POA: Diagnosis not present

## 2017-07-18 DIAGNOSIS — C779 Secondary and unspecified malignant neoplasm of lymph node, unspecified: Secondary | ICD-10-CM | POA: Diagnosis not present

## 2017-07-18 DIAGNOSIS — Z432 Encounter for attention to ileostomy: Secondary | ICD-10-CM | POA: Diagnosis not present

## 2017-07-18 DIAGNOSIS — M545 Low back pain: Secondary | ICD-10-CM | POA: Diagnosis not present

## 2017-07-18 DIAGNOSIS — Z8673 Personal history of transient ischemic attack (TIA), and cerebral infarction without residual deficits: Secondary | ICD-10-CM | POA: Diagnosis not present

## 2017-07-19 DIAGNOSIS — F418 Other specified anxiety disorders: Secondary | ICD-10-CM | POA: Diagnosis not present

## 2017-07-20 ENCOUNTER — Encounter: Payer: Self-pay | Admitting: Surgery

## 2017-07-20 ENCOUNTER — Ambulatory Visit (INDEPENDENT_AMBULATORY_CARE_PROVIDER_SITE_OTHER): Payer: 59 | Admitting: Surgery

## 2017-07-20 VITALS — BP 113/78 | HR 107 | Temp 98.1°F | Ht 65.0 in | Wt 143.4 lb

## 2017-07-20 DIAGNOSIS — R1084 Generalized abdominal pain: Secondary | ICD-10-CM

## 2017-07-20 DIAGNOSIS — L02211 Cutaneous abscess of abdominal wall: Secondary | ICD-10-CM

## 2017-07-20 NOTE — Patient Instructions (Signed)
We have scheduled you for a CT Scan of your Abdomen and Pelvis. This has been scheduled at 11:30 at our [facility] location. Please Check-in at 11:15, 15 minutes prior to your scheduled appointment. If you need to reschedule your Scan, you may do so by calling [phone].  You will need to pick up a prep kit at least 24 hours in advance of your Scan: You may pick this up at the [place] department at [place] [facility], or [employer].  Bring a list of medications with you to your appointment and you may have nothing to eat or drink 4 hours prior to your CT Scan.    We will follow up with you as listed below:

## 2017-07-20 NOTE — Progress Notes (Signed)
Surgical Clinic Progress/Follow-up Note   HPI:  51 y.o. Female presents to clinic for follow-up evaluation. Patient reports she continues to feel better, particularly after a recent PET scan at [facility] did not demonstrate any recurrent or residual melanoma. She has been eating 3 small meals per day with additional oral nutritional supplement shakes, has gained 7 lbs, and the amount draining from her ileostomy has remained lower and thicker in consistency with TID Imodium. She denies any abdominal pain (likewise has not been requiring nor taking anything for abdominal pain) and denies GERD, fever/chills, N/V, anxiety, CP, or SOB. She also denies any arthritic pain associated with her chronic rheumatoid arthritis except mild Left shoulder pain despite being off her medications for RA.  Review of Systems:  Constitutional: denies any other weight loss, fever, chills, or sweats  Eyes: denies any other vision changes, history of eye injury  ENT: denies sore throat, hearing problems  Respiratory: denies shortness of breath, wheezing  Cardiovascular: denies chest pain, palpitations  Gastrointestinal: abdominal pain, N/V, and bowel function as per HPI Musculoskeletal: denies any other joint pains or cramps  Skin: Denies any other rashes or skin discolorations  Neurological: denies any other headache, dizziness, weakness  Psychiatric: denies any other depression, anxiety  All other review of systems: otherwise negative   Vital Signs:  BP 113/78   Pulse (!) 107   Temp 98.1 F (36.7 C) (Oral)   Ht 5\' 5"  (1.651 m)   Wt 143 lb 6.4 oz (65 kg)   BMI 23.86 kg/m    Physical Exam:  Constitutional:  -- Normal body habitus  -- Awake, alert, and oriented x3  Eyes:  -- Pupils equally round and reactive to light  -- No scleral icterus  Ear, nose, throat:  -- No jugular venous distension  -- No nasal drainage, bleeding Pulmonary:  -- No crackles -- Equal breath sounds bilaterally -- Breathing  non-labored at rest Cardiovascular:  -- S1, S2 present  -- No pericardial rubs  Gastrointestinal:  -- Soft, nontender, non-distended, no guarding/rebound -- Vertical midline laparotomy and B/L drain sites appear well-approximated and NT without any surrounding erythema or drainage -- No abdominal masses appreciated, pulsatile or otherwise  Musculoskeletal / Integumentary:  -- Wounds or skin discoloration: None appreciated except post-surgical abdominal wounds as described above (GI)  -- Extremities: B/L UE and LE FROM, hands and feet warm, no edema  Neurologic:  -- Motor function: intact and symmetric  -- Sensation: intact and symmetric   Laboratory studies:  CBC Latest Ref Rng & Units 07/05/2017 07/04/2017 07/02/2017  WBC 3.6 - 11.0 K/uL 8.4 8.5 10.5  Hemoglobin 12.0 - 16.0 g/dL 7.9(L) 8.5(L) 10.1(L)  Hematocrit 35.0 - 47.0 % 23.3(L) 24.8(L) 29.3(L)  Platelets 150 - 440 K/uL 400 418 503(H)   CMP Latest Ref Rng & Units 07/06/2017 07/05/2017 07/04/2017  Glucose 65 - 99 mg/dL 79 106(H) 95  BUN 6 - 20 mg/dL 12 17 21(H)  Creatinine 0.44 - 1.00 mg/dL 0.80 0.91 1.02(H)  Sodium 135 - 145 mmol/L 135 131(L) 130(L)  Potassium 3.5 - 5.1 mmol/L 3.7 4.0 4.0  Chloride 101 - 111 mmol/L 112(H) 108 105  CO2 22 - 32 mmol/L 21(L) 18(L) 21(L)  Calcium 8.9 - 10.3 mg/dL 7.4(L) 7.6(L) 7.7(L)  Total Protein 6.5 - 8.1 g/dL - - -  Total Bilirubin 0.3 - 1.2 mg/dL - - -  Alkaline Phos 38 - 126 U/L - - -  AST 15 - 41 U/L - - -  ALT 14 -  54 U/L - - -    Imaging:  CT Abdomen and Pelvis with Contrast (07/13/2017) - personally reviewed with patient in office today and compared with prior imaging studies Evidence of previous partial colectomy with rectosigmoid anastomosis. Resolution of previously seen small extraluminal air collection adjacent the anastomosis. Ostomy site over the right lower quadrant unchanged. Multiple subcapsular fluid collections over the liver and spleen as well as within the  abdomen predominately over the pericolic gutters with overall interval decrease in size of these collections as described. Slight interval increase in size of a fluid collection posterolateral to the left psoas muscle measuring 2 x 4.6 cm (previously 1.5 x 4 cm). Interval increase in size of a predominant fluid collection with small foci of air over the lower midline abdominal wall incision measuring 3.6 x 6.8 cm (previously 3.1 x 5 cm).  Two percutaneous surgical drains over the pelvis unchanged.  2 cm left liver cyst unchanged.  1.2 cm left renal cyst.  Aortic Atherosclerosis   Assessment:  51 y.o. yo Female with a problem list including...  Patient Active Problem List   Diagnosis Date Noted  . Pressure injury of skin 07/02/2017  . Dehydration   . Intraperitoneal abscess ([place])   . Acute kidney injury ([place]) 07/01/2017  . Large intestine anastomotic leak 06/09/2017  . Difficult intubation   . Diverticulitis of intestine with abscess 05/03/2017  . Tobacco abuse 05/02/2017  . Hypokalemia   . Hyperthyroidism   . Difficult airway   . Diverticulitis of large intestine with abscess   . Diverticulitis 04/27/2017  . Hypocalcemia 03/02/2017  . 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 S/P total thyroidectomy 01/28/2017  . 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 Abnormal TSH 08/04/2016  . Encounter for antineoplastic chemotherapy and immunotherapy 07/13/2016  . Encounter for long-term (current) use of high-risk medication 06/23/2016  . 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 Malignant melanoma of lower leg, left ([place]) 12/03/2015  . History of stroke 12/12/2013  . Rheumatoid arthritis involving multiple sites with positive rheumatoid factor ([place]) 12/12/2013  . CVA (cerebral infarction) 07/23/2012  . Hypertension 07/23/2012    presents to clinic for follow-up evaluation, doing overall quite well, 6 weeks s/p exploratory laparotomy with abdominal  washout, creation of diverting ileostomy, and placement of 19 F Blake pelvic drains x 2 for colorectal anastomotic disruption 9 Days following laparoscopic sigmoid colectomy with primary EEA anastomosis, attributable at least in part to since-discontinued medication for Rheumatoid Arthritis and complicated by pertinent comorbidities including HTN, HLD, stroke (2013), hyperthyroidism, osteoarthritis, nephrolithiasis, and a history significant for skin melanoma.  Plan:   - continue antibiotics until follow-up in 1 week   - check one more CT abdomen and pelvis to reassess peri-incisional abdominal wall abscess  - if remains present without improvement or worsened, wound consider drainage of abdominal wall abscess  - return to clinic in 1 week, instructed to call office if any questions or concerns  All of the above recommendations were discussed with the patient, and all of patient's questions were answered to her expressed satisfaction.  -- Marilynne Drivers Rosana Hoes, MD, [place]: [facility] - Partner[facility] for exceptional care. Office: [phone]

## 2017-07-21 DIAGNOSIS — Z5111 Encounter for antineoplastic chemotherapy: Secondary | ICD-10-CM | POA: Diagnosis not present

## 2017-07-21 DIAGNOSIS — Z5112 Encounter for antineoplastic immunotherapy: Secondary | ICD-10-CM | POA: Diagnosis not present

## 2017-07-21 DIAGNOSIS — Z79899 Other long term (current) drug therapy: Secondary | ICD-10-CM | POA: Diagnosis not present

## 2017-07-21 DIAGNOSIS — M0579 Rheumatoid arthritis with rheumatoid factor of multiple sites without organ or systems involvement: Secondary | ICD-10-CM | POA: Diagnosis not present

## 2017-07-22 DIAGNOSIS — Z8673 Personal history of transient ischemic attack (TIA), and cerebral infarction without residual deficits: Secondary | ICD-10-CM | POA: Diagnosis not present

## 2017-07-22 DIAGNOSIS — F17210 Nicotine dependence, cigarettes, uncomplicated: Secondary | ICD-10-CM | POA: Diagnosis not present

## 2017-07-22 DIAGNOSIS — Z432 Encounter for attention to ileostomy: Secondary | ICD-10-CM | POA: Diagnosis not present

## 2017-07-22 DIAGNOSIS — M545 Low back pain: Secondary | ICD-10-CM | POA: Diagnosis not present

## 2017-07-22 DIAGNOSIS — Z4803 Encounter for change or removal of drains: Secondary | ICD-10-CM | POA: Diagnosis not present

## 2017-07-22 DIAGNOSIS — Z79891 Long term (current) use of opiate analgesic: Secondary | ICD-10-CM | POA: Diagnosis not present

## 2017-07-22 DIAGNOSIS — M0579 Rheumatoid arthritis with rheumatoid factor of multiple sites without organ or systems involvement: Secondary | ICD-10-CM | POA: Diagnosis not present

## 2017-07-22 DIAGNOSIS — T8132XD Disruption of internal operation (surgical) wound, not elsewhere classified, subsequent encounter: Secondary | ICD-10-CM | POA: Diagnosis not present

## 2017-07-25 ENCOUNTER — Ambulatory Visit
Admission: RE | Admit: 2017-07-25 | Discharge: 2017-07-25 | Disposition: A | Discharge status: Home / Self Care | Payer: 59 | Source: Ambulatory Visit | Attending: Surgery | Admitting: Surgery

## 2017-07-25 DIAGNOSIS — R188 Other ascites: Secondary | ICD-10-CM | POA: Insufficient documentation

## 2017-07-25 DIAGNOSIS — R1084 Generalized abdominal pain: Secondary | ICD-10-CM | POA: Diagnosis not present

## 2017-07-25 DIAGNOSIS — M0579 Rheumatoid arthritis with rheumatoid factor of multiple sites without organ or systems involvement: Secondary | ICD-10-CM | POA: Diagnosis not present

## 2017-07-25 DIAGNOSIS — T8132XD Disruption of internal operation (surgical) wound, not elsewhere classified, subsequent encounter: Secondary | ICD-10-CM | POA: Diagnosis not present

## 2017-07-25 DIAGNOSIS — R109 Unspecified abdominal pain: Secondary | ICD-10-CM | POA: Diagnosis not present

## 2017-07-25 DIAGNOSIS — Z8673 Personal history of transient ischemic attack (TIA), and cerebral infarction without residual deficits: Secondary | ICD-10-CM | POA: Diagnosis not present

## 2017-07-25 DIAGNOSIS — Z432 Encounter for attention to ileostomy: Secondary | ICD-10-CM | POA: Diagnosis not present

## 2017-07-25 DIAGNOSIS — Z4803 Encounter for change or removal of drains: Secondary | ICD-10-CM | POA: Diagnosis not present

## 2017-07-25 DIAGNOSIS — F17210 Nicotine dependence, cigarettes, uncomplicated: Secondary | ICD-10-CM | POA: Diagnosis not present

## 2017-07-25 DIAGNOSIS — M545 Low back pain: Secondary | ICD-10-CM | POA: Diagnosis not present

## 2017-07-25 DIAGNOSIS — Z79891 Long term (current) use of opiate analgesic: Secondary | ICD-10-CM | POA: Diagnosis not present

## 2017-07-25 MED ORDER — IOPAMIDOL (ISOVUE-300) INJECTION 61%
100.0000 mL | Freq: Once | INTRAVENOUS | Status: AC | PRN
  Administered 2017-07-25: 100 mL via INTRAVENOUS

## 2017-07-26 ENCOUNTER — Ambulatory Visit (INDEPENDENT_AMBULATORY_CARE_PROVIDER_SITE_OTHER): Payer: 59 | Admitting: General Surgery

## 2017-07-26 ENCOUNTER — Encounter: Payer: Self-pay | Admitting: General Surgery

## 2017-07-26 ENCOUNTER — Encounter: Payer: Self-pay | Admitting: Emergency Medicine

## 2017-07-26 ENCOUNTER — Emergency Department
Admission: EM | Admit: 2017-07-26 | Discharge: 2017-07-27 | Disposition: A | Discharge status: Home / Self Care | Payer: 59 | Attending: Emergency Medicine | Admitting: Emergency Medicine

## 2017-07-26 ENCOUNTER — Other Ambulatory Visit: Payer: Self-pay

## 2017-07-26 VITALS — BP 109/83 | HR 115 | Temp 98.3°F | Wt 140.0 lb

## 2017-07-26 DIAGNOSIS — Z8673 Personal history of transient ischemic attack (TIA), and cerebral infarction without residual deficits: Secondary | ICD-10-CM | POA: Diagnosis not present

## 2017-07-26 DIAGNOSIS — Z91040 Latex allergy status: Secondary | ICD-10-CM | POA: Insufficient documentation

## 2017-07-26 DIAGNOSIS — E860 Dehydration: Secondary | ICD-10-CM | POA: Diagnosis not present

## 2017-07-26 DIAGNOSIS — K5720 Diverticulitis of large intestine with perforation and abscess without bleeding: Secondary | ICD-10-CM

## 2017-07-26 DIAGNOSIS — Z4889 Encounter for other specified surgical aftercare: Secondary | ICD-10-CM

## 2017-07-26 DIAGNOSIS — I10 Essential (primary) hypertension: Secondary | ICD-10-CM | POA: Diagnosis not present

## 2017-07-26 DIAGNOSIS — F17210 Nicotine dependence, cigarettes, uncomplicated: Secondary | ICD-10-CM | POA: Diagnosis not present

## 2017-07-26 DIAGNOSIS — R110 Nausea: Secondary | ICD-10-CM | POA: Diagnosis not present

## 2017-07-26 DIAGNOSIS — E039 Hypothyroidism, unspecified: Secondary | ICD-10-CM | POA: Insufficient documentation

## 2017-07-26 DIAGNOSIS — E871 Hypo-osmolality and hyponatremia: Secondary | ICD-10-CM | POA: Insufficient documentation

## 2017-07-26 LAB — COMPREHENSIVE METABOLIC PANEL
ALBUMIN: 3.8 g/dL (ref 3.5–5.0)
ALT: 16 U/L (ref 14–54)
AST: 22 U/L (ref 15–41)
Alkaline Phosphatase: 142 U/L — ABNORMAL HIGH (ref 38–126)
Anion gap: 13 (ref 5–15)
BUN: 30 mg/dL — AB (ref 6–20)
CHLORIDE: 92 mmol/L — AB (ref 101–111)
CO2: 19 mmol/L — ABNORMAL LOW (ref 22–32)
Calcium: 10.3 mg/dL (ref 8.9–10.3)
Creatinine, Ser: 1.1 mg/dL — ABNORMAL HIGH (ref 0.44–1.00)
GFR calc Af Amer: >60 mL/min (ref >60)
GFR, EST NON AFRICAN AMERICAN: 56 mL/min — AB (ref >60)
Glucose, Bld: 108 mg/dL — ABNORMAL HIGH (ref 65–99)
POTASSIUM: 5.4 mmol/L — AB (ref 3.5–5.1)
Sodium: 124 mmol/L — ABNORMAL LOW (ref 135–145)
Total Bilirubin: 0.6 mg/dL (ref 0.3–1.2)
Total Protein: 8.5 g/dL — ABNORMAL HIGH (ref 6.5–8.1)

## 2017-07-26 LAB — URINALYSIS, COMPLETE (UACMP) WITH MICROSCOPIC
BACTERIA UA: NONE SEEN
BILIRUBIN URINE: NEGATIVE
Glucose, UA: NEGATIVE mg/dL
KETONES UR: NEGATIVE mg/dL
Nitrite: NEGATIVE
PROTEIN: 30 mg/dL — AB
Specific Gravity, Urine: 1.025 (ref 1.005–1.030)
pH: 5 (ref 5.0–8.0)

## 2017-07-26 LAB — BASIC METABOLIC PANEL
Anion gap: 6 (ref 5–15)
BUN: 28 mg/dL — ABNORMAL HIGH (ref 6–20)
CALCIUM: 8.6 mg/dL — AB (ref 8.9–10.3)
CO2: 19 mmol/L — ABNORMAL LOW (ref 22–32)
CREATININE: 1.03 mg/dL — AB (ref 0.44–1.00)
Chloride: 101 mmol/L (ref 101–111)
GFR calc non Af Amer: >60 mL/min (ref >60)
Glucose, Bld: 100 mg/dL — ABNORMAL HIGH (ref 65–99)
Potassium: 4.6 mmol/L (ref 3.5–5.1)
SODIUM: 126 mmol/L — AB (ref 135–145)

## 2017-07-26 LAB — CBC
HEMATOCRIT: 38 % (ref 35.0–47.0)
Hemoglobin: 12.4 g/dL (ref 12.0–16.0)
MCH: 26.6 pg (ref 26.0–34.0)
MCHC: 32.7 g/dL (ref 32.0–36.0)
MCV: 81.3 fL (ref 80.0–100.0)
Platelets: 690 10*3/uL — ABNORMAL HIGH (ref 150–440)
RBC: 4.68 MIL/uL (ref 3.80–5.20)
RDW: 17.1 % — AB (ref 11.5–14.5)
WBC: 8.9 10*3/uL (ref 3.6–11.0)

## 2017-07-26 LAB — LIPASE, BLOOD: LIPASE: 42 U/L (ref 11–51)

## 2017-07-26 MED ORDER — ONDANSETRON 4 MG PO TBDP: 4.0000 mg | ORAL_TABLET | Freq: Three times a day (TID) | ORAL | 0 refills | Status: DC | PRN

## 2017-07-26 MED ORDER — ONDANSETRON 4 MG PO FILM: 1.0000 | ORAL_FILM | Freq: Four times a day (QID) | ORAL | 0 refills | Status: DC | PRN

## 2017-07-26 MED ORDER — ONDANSETRON 4 MG PO TBDP
4.0000 mg | ORAL_TABLET | Freq: Once | ORAL | Status: AC
  Administered 2017-07-27: 4 mg via ORAL
  Filled 2017-07-26: qty 1

## 2017-07-26 MED ORDER — SODIUM CHLORIDE 0.9 % IV SOLN
Freq: Once | INTRAVENOUS | Status: AC
  Administered 2017-07-26: 22:00:00 via INTRAVENOUS

## 2017-07-26 MED ORDER — LORAZEPAM 2 MG/ML IJ SOLN
1.0000 mg | Freq: Once | INTRAMUSCULAR | Status: AC
  Administered 2017-07-26: 1 mg via INTRAVENOUS
  Filled 2017-07-26: qty 1

## 2017-07-26 MED ORDER — ONDANSETRON HCL 4 MG/2ML IJ SOLN
4.0000 mg | Freq: Once | INTRAMUSCULAR | Status: AC
  Administered 2017-07-26: 4 mg via INTRAVENOUS
  Filled 2017-07-26: qty 2

## 2017-07-26 MED ORDER — SODIUM CHLORIDE 0.9 % IV SOLN
Freq: Once | INTRAVENOUS | Status: AC
  Administered 2017-07-26: 21:00:00 via INTRAVENOUS

## 2017-07-26 MED ORDER — ONDANSETRON HCL 4 MG PO TABS: 4.0000 mg | ORAL_TABLET | Freq: Three times a day (TID) | ORAL | 0 refills | Status: DC | PRN

## 2017-07-26 MED ORDER — PROMETHAZINE HCL 25 MG PO TABS: 25.0000 mg | ORAL_TABLET | ORAL | 1 refills | Status: DC | PRN

## 2017-07-26 NOTE — Addendum Note (Signed)
Addended by: Wayna Chalet on: 07/26/2017 02:56 PM   Modules accepted: Orders

## 2017-07-26 NOTE — ED Triage Notes (Signed)
Patient presents to ED via POV from home with c/o nausea and lack of appetite. Hx diverticulitis.

## 2017-07-26 NOTE — Progress Notes (Signed)
Outpatient Surgical Follow Up  07/26/2017  Rebecca Obrien is an 51 y.o. female.   Chief Complaint  Patient presents with  . Routine Post Op    follow-up in clinic next week for additional wound check and abdominal exam review CT Scan--Pre Dr. Rosana Hoes    HPI: 51 year old female who is well-known to the surgery department and is now 7 weeks status post loop ileostomy creation due to pelvic anastomotic disruption.  She had a repeat CT scan yesterday.  She reports since drinking the oral contrast for the CT scan she has had persistent nausea but denies any vomiting.  She denies any abdominal pain and states her ileostomy is still functioning well.  She denies any fevers or chills.  Past Medical History:  Diagnosis Date  . Arthritis    RA  . Cancer ([place])    skin melanoma  . Collagen vascular disease (HCC)    RA  . Difficult airway   . 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 Diverticulitis of sigmoid colon   . History of kidney stones   . Hyperlipidemia   . Hypertension   . Hyperthyroidism   . Hypokalemia   . Obesity   . Rheumatoid arthritis(714.0)   . Stroke [facility]) 2013   no residual    Past Surgical History:  Procedure Laterality Date  . ABDOMINAL HYSTERECTOMY    . CARPAL TUNNEL RELEASE  Bilateral  . CHOLECYSTECTOMY    . CYSTOSCOPY WITH STENT PLACEMENT- LIGHTED STENTS Bilateral 05/31/2017   Performed by Hollice Espy, MD at [facility] ORS  . EXPLORATORY LAPAROTOMY N/A 06/08/2017   Performed by Vickie Epley, MD at [facility] ORS  . ILEOSTOMY N/A 06/08/2017   Performed by Vickie Epley, MD at [facility] ORS  . LAPAROSCOPIC SIGMOID COLECTOMY N/A 05/31/2017   Performed by Clayburn Pert, MD at [facility] ORS  . TONSILLECTOMY    . TOTAL THYROIDECTOMY    . TRANSESOPHAGEAL ECHOCARDIOGRAM (TEE) N/A 07/26/2012   Performed by Acie Fredrickson Wonda Cheng, MD at [facility] ENDOSCOPY    Family History  Problem Relation Age of Onset  . Nephrolithiasis Mother   .  Hypertension Mother   . Hypertension Father   . Heart failure Father   . Cardiomyopathy Brother     Social History:  reports that she has been smoking cigarettes.  She has a 5.75 pack-year smoking history. she has never used smokeless tobacco. She reports that she does not drink alcohol or use drugs.  Allergies:  Allergies  Allergen Reactions  . Tape Dermatitis    Skin breakdown/dermatitis with prolonged use   . Latex Dermatitis    With prolonged use  NOT LATEX ALLERGIC.  Patient has problem with adhesive, not latex    Medications reviewed.    ROS Multipoint review of systems was obtained.  All pertinent positives and negatives are documented HPI and remainder are negative   BP 109/83   Pulse (!) 115   Temp 98.3 F (36.8 C) (Oral)   Wt 63.5 kg (140 lb)   BMI 23.30 kg/m   Physical Exam General: No acute distress Chest: Clear to auscultation Heart: Tachycardic Abdomen: Soft, nontender, nondistended.  Right upper quadrant loop ileostomy present that is functioning normally.  No evidence outwardly of infection to any surgical site.    No results found for this or any previous visit (from the past 48 hour(s)). Ct Abdomen Pelvis W Contrast  Result Date: 07/25/2017 CLINICAL DATA:  7 weeks s/p  exploratory laparotomy with abdominal washout, creation of diverting ileostomy, and placement of 19 F Blake pelvic drains x 2 for colorectal anastomotic disruption 9 Days following laparoscopic sigmoid colectomy with primary EEA anastomosis, attributable at least in part to since-discontinued medication for Rheumatoid Arthritis and complicated by pertinent comorbidities including HTN, HLD, stroke (2013), hyperthyroidism, osteoarthritis, nephrolithiasis, and a history significant for skin melanoma.Eval. Fluid collection on last scan EXAM: CT ABDOMEN AND PELVIS WITH CONTRAST TECHNIQUE: Multidetector CT imaging of the abdomen and pelvis was performed using the standard protocol following  bolus administration of intravenous contrast. CONTRAST:  139mL ISOVUE-300 IOPAMIDOL (ISOVUE-300) INJECTION 61% COMPARISON:  CT, 07/13/2017 FINDINGS: Lower chest: Clear lung bases.  Heart normal size. Hepatobiliary: 18 mm cyst in segment 2, posteriorly. Subcapsular fluid collection over the anterolateral right liver. Small fluid collections lie along the posterior inferior margin of the liver. No other liver lesions. Status post cholecystectomy. No bile duct dilation. Pancreas: Unremarkable. No pancreatic ductal dilatation or surrounding inflammatory changes. Spleen: Fluid collection lies along the superolateral aspect of the spleen. Spleen normal in size. No splenic mass or focal lesion. Adrenals/Urinary Tract: No adrenal masses. Stable midpole left renal cyst. No other renal masses, no stones and no hydronephrosis. Normal ureters. Bladder is unremarkable. Stomach/Bowel: There are multiple peritoneal fluid collections. The fluid collection that lies adjacent to the spleen measures approximately 9.5 x 2.4 x 6.7 cm. The subcapsular fluid collection over the anterolateral right liver measures approximately 5.3 x 1.8 x 3.8 cm. Small fluid collections extend from the posterior inferior margin of the liver, mildly decreased from the prior study. The more inferior aspect of this fluid collection currently measures 1.6 cm in greatest dimension, previously 2.3 cm. Fluid collections are noted extending inferiorly from the lateral margin of the spleen along the left pericolic gutter, also decreased in size the prior study, fell maximum of 1 cm in greatest transverse dimension, previously 1.7 cm. There are no new intra-abdominal fluid collections. Stomach is unremarkable. There is a right mid abdomen ileostomy, stable from the prior CT. The hazy opacity in the associated mesenteric is similar to the prior CT. There is no small bowel dilation to suggest obstruction. There is no wall thickening. Colon is normal in caliber. No  colonic inflammatory changes. A bowel anastomosis staple line lies along the lower sigmoid colon, stable. Vascular/Lymphatic: There are no pathologically enlarged lymph nodes. Aortic atherosclerotic changes are stable. Reproductive: Status post hysterectomy. No adnexal masses. Other: There is a fluid collection along the anterior abdominal wall incision, extending to the midline fascia. It measures 6.8 x 3.6 x 5.3 cm, previously 8.6 x 3.6 x 6.8 cm. Musculoskeletal: No acute skeletal abnormality. IMPRESSION: 1. The fluid collections previously described have either decreased in size or are stable. The anterior abdominal wall incisional fluid collection has mildly decreased in size, as have the 2 small fluid collections that extend from the posterior inferior margin of the liver and inferolateral margin of the spleen. The subcapsular fluid collection along the anterolateral right liver and the fluid collection adjacent to the spleen are both without significant change. 2. No new abdominal fluid collections. 3. No evidence of bowel inflammation or obstruction. Electronically Signed   By: Lajean Manes M.D.   On: 07/25/2017 16:15    Assessment/Plan:  1. Aftercare following surgery 51 year old female status post loop ileostomy creation and drainage of pelvic abscess secondary to anastomotic disruption.  CT scan looks much better.  However patient has had consistent nausea since the oral contrast was drink.  We will provide her with a prescription for Zofran today.  Counseled her that should she go another 24 hours without being able to take anything by mouth she needs to return to clinic to be brought into the hospital for hydration.  Patient voiced understanding.  Otherwise we will obtain a barium enema at the 77-month postoperative standpoint and see her in clinic afterwards to discuss its results and loop ileostomy takedown.     Clayburn Pert, MD FACS General Surgeon  07/26/2017,11:26 AM

## 2017-07-26 NOTE — Addendum Note (Signed)
Addended by: Wayna Chalet on: 07/26/2017 12:26 PM   Modules accepted: Orders

## 2017-07-26 NOTE — Patient Instructions (Addendum)
Please finish your antibiotics.  Please start taking your Ondansetron today to help you with the nausea. If this doesn't help by tomorrow, please give [place] a call and let us know so one of our physicians sees you and then possibly admit you.   We have scheduled you for a Barium Enema today. Your appointment has been scheduled for  at . You will need to arrive at  to register for this test.  Bring a list of medications with you to your appointment.  You will need to be on clear liquid diet for 24 hours prior to your appointment and may not have anything to eat or drink after midnight the day of your testing.  If you need to reschedule your Scan, you may do so by calling [phone].

## 2017-07-26 NOTE — ED Provider Notes (Signed)
[facility] Emergency Department Provider Note       Time seen: ----------------------------------------- 9:23 PM on 07/26/2017 -----------------------------------------   I have reviewed the triage vital signs and the nursing notes.  HISTORY   Chief Complaint Nausea    HPI Rebecca Obrien is a 51 y.o. female with a history of diverticulitis of large intestine and ileostomy who presents to the ED for nausea and poor appetite with recent ileostomy.  She denies fevers, chills, chest pain, shortness of breath, but has had persistent nausea and has not been eating or drinking very much.  She was seen by general surgery today and sent to the ER for IV fluids.  Past Medical History:  Diagnosis Date  . Arthritis    RA  . Cancer ([place])    skin melanoma  . Collagen vascular disease (HCC)    RA  . Difficult airway   . 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 Diverticulitis of sigmoid colon   . History of kidney stones   . Hyperlipidemia   . Hypertension   . Hyperthyroidism   . Hypokalemia   . Obesity   . Rheumatoid arthritis(714.0)   . Stroke [facility]) 2013   no residual    Patient Active Problem List   Diagnosis Date Noted  . Pressure injury of skin 07/02/2017  . Dehydration   . Intraperitoneal abscess ([place])   . Acute kidney injury ([place]) 07/01/2017  . Large intestine anastomotic leak 06/09/2017  . Difficult intubation   . Diverticulitis of intestine with abscess 05/03/2017  . Tobacco abuse 05/02/2017  . Hypokalemia   . Hyperthyroidism   . Difficult airway   . Diverticulitis of large intestine with abscess   . Diverticulitis 04/27/2017  . Hypocalcemia 03/02/2017  . 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 S/P total thyroidectomy 01/28/2017  . 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 Abnormal TSH 08/04/2016  . Encounter for antineoplastic chemotherapy and immunotherapy 07/13/2016  . Encounter for  long-term (current) use of high-risk medication 06/23/2016  . 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 Malignant melanoma of lower leg, left ([place]) 12/03/2015  . History of stroke 12/12/2013  . Rheumatoid arthritis involving multiple sites with positive rheumatoid factor ([place]) 12/12/2013  . CVA (cerebral infarction) 07/23/2012  . Hypertension 07/23/2012    Past Surgical History:  Procedure Laterality Date  . ABDOMINAL HYSTERECTOMY    . CARPAL TUNNEL RELEASE  Bilateral  . CHOLECYSTECTOMY    . CYSTOSCOPY WITH STENT PLACEMENT Bilateral 05/31/2017   Procedure: CYSTOSCOPY WITH STENT PLACEMENT- LIGHTED STENTS;  Surgeon: Hollice Espy, MD;  Location: [facility];  Service: Urology;  Laterality: Bilateral;  . ILEOSTOMY N/A 06/08/2017   Procedure: ILEOSTOMY;  Surgeon: Vickie Epley, MD;  Location: [place] [facility];  Service: General;  Laterality: N/A;  . LAPAROSCOPIC SIGMOID COLECTOMY N/A 05/31/2017   Procedure: LAPAROSCOPIC SIGMOID COLECTOMY;  Surgeon: Clayburn Pert, MD;  Location: [facility];  Service: General;  Laterality: N/A;  . LAPAROTOMY N/A 06/08/2017   Procedure: EXPLORATORY LAPAROTOMY;  Surgeon: Vickie Epley, MD;  Location: [facility];  Service: General;  Laterality: N/A;  . TEE WITHOUT CARDIOVERSION  07/26/2012   Procedure: TRANSESOPHAGEAL ECHOCARDIOGRAM (TEE);  Surgeon: Thayer Headings, MD;  Location: [place];  Service: Cardiovascular;  Laterality: N/A;  . TONSILLECTOMY    . TOTAL THYROIDECTOMY      Allergies Tape and Latex  Social History Social History   Tobacco Use  . Smoking status:  Current Every Day Smoker    Packs/day: 0.25    Years: 23.00    Pack years: 5.75    Types: Cigarettes    Last attempt to quit: 07/23/2012    Years since quitting: 5.0  . Smokeless tobacco: Never Used  Substance Use Topics  . Alcohol use: No  . Drug use: No    Review of Systems Constitutional: Negative for fever. Cardiovascular: Negative for chest  pain. Respiratory: Negative for shortness of breath. Gastrointestinal: Negative for abdominal pain, positive for nausea and poor appetite Genitourinary: Negative for dysuria. Musculoskeletal: Negative for back pain. Skin: Negative for rash. Neurological: Negative for headaches, focal weakness or numbness.  All systems negative/normal/unremarkable except as stated in the HPI  ____________________________________________   PHYSICAL EXAM:  VITAL SIGNS: ED Triage Vitals  Enc Vitals Group     BP 07/26/17 1846 107/78     Pulse Rate 07/26/17 1846 (!) 112     Resp 07/26/17 1846 16     Temp 07/26/17 1844 97.8 F (36.6 C)     Temp Source 07/26/17 1844 Oral     SpO2 07/26/17 1846 100 %     Weight 07/26/17 1846 140 lb (63.5 kg)     Height 07/26/17 1846 5\' 3"  (1.6 m)     Head Circumference --      Peak Flow --      Pain Score 07/26/17 1846 0     Pain Loc --      Pain Edu? --      Excl. in [place]? --    Constitutional: Alert and oriented. Well appearing and in no distress. Eyes: Conjunctivae are normal. Normal extraocular movements. Cardiovascular: Normal rate, regular rhythm. No murmurs, rubs, or gallops. Respiratory: Normal respiratory effort without tachypnea nor retractions. Breath sounds are clear and equal bilaterally. No wheezes/rales/rhonchi. Gastrointestinal: Ileostomy with normal-appearing output, nontender abdomen. Musculoskeletal: Nontender with normal range of motion in extremities. No lower extremity tenderness nor edema. Neurologic:  Normal speech and language. No gross focal neurologic deficits are appreciated.  Skin:  Skin is warm, dry and intact. No rash noted. Psychiatric: Mood and affect are normal. Speech and behavior are normal.  ____________________________________________  ED COURSE:  Pertinent labs & imaging results that were available during my care of the patient were reviewed by me and considered in my medical decision making (see chart for details). Patient  presents for nausea and poor appetite, we will assess with labs and give IV fluids.   Procedures ____________________________________________   LABS (pertinent positives/negatives)  Labs Reviewed  COMPREHENSIVE METABOLIC PANEL - Abnormal; Notable for the following components:      Result Value   Sodium 124 (*)    Potassium 5.4 (*)    Chloride 92 (*)    CO2 19 (*)    Glucose, Bld 108 (*)    BUN 30 (*)    Creatinine, Ser 1.10 (*)    Total Protein 8.5 (*)    Alkaline Phosphatase 142 (*)    GFR calc non Af Amer 56 (*)    All other components within normal limits  CBC - Abnormal; Notable for the following components:   RDW 17.1 (*)    Platelets 690 (*)    All other components within normal limits  URINALYSIS, COMPLETE (UACMP) WITH MICROSCOPIC - Abnormal; Notable for the following components:   Color, Urine AMBER (*)    APPearance CLOUDY (*)    Hgb urine dipstick SMALL (*)    Protein, ur 30 (*)  Leukocytes, UA SMALL (*)    Squamous Epithelial / LPF 6-30 (*)    All other components within normal limits  LIPASE, BLOOD    RADIOLOGY  CT of the abdomen and pelvis from yesterday IMPRESSION: 1. The fluid collections previously described have either decreased in size or are stable. The anterior abdominal wall incisional fluid collection has mildly decreased in size, as have the 2 small fluid collections that extend from the posterior inferior margin of the liver and inferolateral margin of the spleen. The subcapsular fluid collection along the anterolateral right liver and the fluid collection adjacent to the spleen are both without significant change. 2. No new abdominal fluid collections. 3. No evidence of bowel inflammation or obstruction.   ____________________________________________  DIFFERENTIAL DIAGNOSIS   Dehydration, electrolyte abnormality, renal failure, diverticulitis, diverticular abscess  FINAL ASSESSMENT AND PLAN  Dehydration, hyponatremia   Plan:  Patient had presented for dehydration. Patient's labs did reveal electrolyte abnormalities and dehydration. Patient's imaging from yesterday revealed improvements in her fluid collections.  Overall she is stated she felt better and she was given 2 L of fluid here.  She is stable for outpatient follow-up with general surgery.   Earleen Newport, MD   Note: This note was generated in part or whole with voice recognition software. Voice recognition is usually quite accurate but there are transcription errors that can and very often do occur. I apologize for any typographical errors that were not detected and corrected.     Earleen Newport, MD 07/26/17 2330

## 2017-07-28 DIAGNOSIS — Z432 Encounter for attention to ileostomy: Secondary | ICD-10-CM | POA: Diagnosis not present

## 2017-07-28 DIAGNOSIS — Z4803 Encounter for change or removal of drains: Secondary | ICD-10-CM | POA: Diagnosis not present

## 2017-07-28 DIAGNOSIS — T8132XD Disruption of internal operation (surgical) wound, not elsewhere classified, subsequent encounter: Secondary | ICD-10-CM | POA: Diagnosis not present

## 2017-07-28 DIAGNOSIS — M545 Low back pain: Secondary | ICD-10-CM | POA: Diagnosis not present

## 2017-07-28 DIAGNOSIS — Z79891 Long term (current) use of opiate analgesic: Secondary | ICD-10-CM | POA: Diagnosis not present

## 2017-07-28 DIAGNOSIS — F17210 Nicotine dependence, cigarettes, uncomplicated: Secondary | ICD-10-CM | POA: Diagnosis not present

## 2017-07-28 DIAGNOSIS — Z8673 Personal history of transient ischemic attack (TIA), and cerebral infarction without residual deficits: Secondary | ICD-10-CM | POA: Diagnosis not present

## 2017-07-28 DIAGNOSIS — M0579 Rheumatoid arthritis with rheumatoid factor of multiple sites without organ or systems involvement: Secondary | ICD-10-CM | POA: Diagnosis not present

## 2017-07-31 DIAGNOSIS — Z4803 Encounter for change or removal of drains: Secondary | ICD-10-CM | POA: Diagnosis not present

## 2017-07-31 DIAGNOSIS — T8132XD Disruption of internal operation (surgical) wound, not elsewhere classified, subsequent encounter: Secondary | ICD-10-CM | POA: Diagnosis not present

## 2017-07-31 DIAGNOSIS — Z79891 Long term (current) use of opiate analgesic: Secondary | ICD-10-CM | POA: Diagnosis not present

## 2017-07-31 DIAGNOSIS — M0579 Rheumatoid arthritis with rheumatoid factor of multiple sites without organ or systems involvement: Secondary | ICD-10-CM | POA: Diagnosis not present

## 2017-07-31 DIAGNOSIS — M545 Low back pain: Secondary | ICD-10-CM | POA: Diagnosis not present

## 2017-07-31 DIAGNOSIS — F17210 Nicotine dependence, cigarettes, uncomplicated: Secondary | ICD-10-CM | POA: Diagnosis not present

## 2017-07-31 DIAGNOSIS — Z8673 Personal history of transient ischemic attack (TIA), and cerebral infarction without residual deficits: Secondary | ICD-10-CM | POA: Diagnosis not present

## 2017-07-31 DIAGNOSIS — Z432 Encounter for attention to ileostomy: Secondary | ICD-10-CM | POA: Diagnosis not present

## 2017-08-03 DIAGNOSIS — Z79891 Long term (current) use of opiate analgesic: Secondary | ICD-10-CM | POA: Diagnosis not present

## 2017-08-03 DIAGNOSIS — Z4803 Encounter for change or removal of drains: Secondary | ICD-10-CM | POA: Diagnosis not present

## 2017-08-03 DIAGNOSIS — M0579 Rheumatoid arthritis with rheumatoid factor of multiple sites without organ or systems involvement: Secondary | ICD-10-CM | POA: Diagnosis not present

## 2017-08-03 DIAGNOSIS — F17210 Nicotine dependence, cigarettes, uncomplicated: Secondary | ICD-10-CM | POA: Diagnosis not present

## 2017-08-03 DIAGNOSIS — M545 Low back pain: Secondary | ICD-10-CM | POA: Diagnosis not present

## 2017-08-03 DIAGNOSIS — Z8673 Personal history of transient ischemic attack (TIA), and cerebral infarction without residual deficits: Secondary | ICD-10-CM | POA: Diagnosis not present

## 2017-08-03 DIAGNOSIS — Z432 Encounter for attention to ileostomy: Secondary | ICD-10-CM | POA: Diagnosis not present

## 2017-08-03 DIAGNOSIS — T8132XD Disruption of internal operation (surgical) wound, not elsewhere classified, subsequent encounter: Secondary | ICD-10-CM | POA: Diagnosis not present

## 2017-08-08 ENCOUNTER — Telehealth: Payer: Self-pay

## 2017-08-08 NOTE — Telephone Encounter (Signed)
I sent Ms. Rebecca Obrien a letter letting her know when her barium enema is by mail.

## 2017-08-16 DIAGNOSIS — Z932 Ileostomy status: Secondary | ICD-10-CM | POA: Diagnosis not present

## 2017-08-16 DIAGNOSIS — K5732 Diverticulitis of large intestine without perforation or abscess without bleeding: Secondary | ICD-10-CM | POA: Diagnosis not present

## 2017-08-17 ENCOUNTER — Encounter: Payer: Self-pay | Admitting: General Surgery

## 2017-08-31 DIAGNOSIS — Z932 Ileostomy status: Secondary | ICD-10-CM | POA: Diagnosis not present

## 2017-08-31 DIAGNOSIS — K5732 Diverticulitis of large intestine without perforation or abscess without bleeding: Secondary | ICD-10-CM | POA: Diagnosis not present

## 2017-09-01 ENCOUNTER — Telehealth: Payer: Self-pay | Admitting: General Practice

## 2017-09-01 NOTE — Telephone Encounter (Signed)
Patients questions answered regarding prep for Barium Enema.

## 2017-09-01 NOTE — Telephone Encounter (Signed)
Patient is calling asking for one of the nurses to give her call regarding her prep kit. Also has questions about being possibly seen sooner. Please call patient and advise.

## 2017-09-12 ENCOUNTER — Ambulatory Visit (INDEPENDENT_AMBULATORY_CARE_PROVIDER_SITE_OTHER): Payer: 59 | Admitting: General Surgery

## 2017-09-12 ENCOUNTER — Encounter: Payer: Self-pay | Admitting: General Surgery

## 2017-09-12 ENCOUNTER — Telehealth: Payer: Self-pay | Admitting: General Surgery

## 2017-09-12 ENCOUNTER — Ambulatory Visit
Admission: RE | Admit: 2017-09-12 | Discharge: 2017-09-12 | Disposition: A | Discharge status: Home / Self Care | Payer: 59 | Source: Ambulatory Visit | Attending: General Surgery | Admitting: General Surgery

## 2017-09-12 ENCOUNTER — Telehealth: Payer: Self-pay

## 2017-09-12 VITALS — BP 131/86 | HR 83 | Temp 98.3°F | Wt 139.0 lb

## 2017-09-12 DIAGNOSIS — M069 Rheumatoid arthritis, unspecified: Secondary | ICD-10-CM | POA: Diagnosis not present

## 2017-09-12 DIAGNOSIS — K5720 Diverticulitis of large intestine with perforation and abscess without bleeding: Secondary | ICD-10-CM | POA: Insufficient documentation

## 2017-09-12 DIAGNOSIS — Z932 Ileostomy status: Secondary | ICD-10-CM | POA: Insufficient documentation

## 2017-09-12 DIAGNOSIS — K5792 Diverticulitis of intestine, part unspecified, without perforation or abscess without bleeding: Secondary | ICD-10-CM | POA: Diagnosis not present

## 2017-09-12 DIAGNOSIS — Z85820 Personal history of malignant melanoma of skin: Secondary | ICD-10-CM | POA: Diagnosis not present

## 2017-09-12 DIAGNOSIS — Z87442 Personal history of urinary calculi: Secondary | ICD-10-CM | POA: Diagnosis not present

## 2017-09-12 DIAGNOSIS — F17210 Nicotine dependence, cigarettes, uncomplicated: Secondary | ICD-10-CM | POA: Diagnosis not present

## 2017-09-12 DIAGNOSIS — Z432 Encounter for attention to ileostomy: Secondary | ICD-10-CM | POA: Diagnosis not present

## 2017-09-12 NOTE — Telephone Encounter (Signed)
I have sent Dr Adonis Huguenin a message with patient's questions. I will contact patient once Dr Adonis Huguenin responds.

## 2017-09-12 NOTE — H&P (View-Only) (Signed)
Outpatient Surgical Follow Up  09/12/2017  Rebecca Obrien is an 52 y.o. female.   Chief Complaint  Patient presents with  . Routine Post Op    Discuss surgery date    HPI: 52 year old female returns to clinic for follow-up after having a barium enema this morning.  She is 3 months status post loop ileostomy creation for pelvic anastomotic leak.  Patient reports she is eating well and having good ostomy function.  She denies any pain, fevers, chills, nausea, vomiting, chest pain, shortness of breath.  Reports that the barium enema was uncomfortable but has completely resolved.  She strongly desires to have the ileostomy reversed as soon as possible  Past Medical History:  Diagnosis Date  . Arthritis    RA  . Cancer ([place])    skin melanoma  . Collagen vascular disease (HCC)    RA  . Difficult airway   . 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 Diverticulitis of sigmoid colon   . History of kidney stones   . Hyperlipidemia   . Hypertension   . Hyperthyroidism   . Hypokalemia   . Obesity   . Rheumatoid arthritis(714.0)   . Stroke [facility]) 2013   no residual    Past Surgical History:  Procedure Laterality Date  . ABDOMINAL HYSTERECTOMY    . CARPAL TUNNEL RELEASE  Bilateral  . CHOLECYSTECTOMY    . CYSTOSCOPY WITH STENT PLACEMENT Bilateral 05/31/2017   Procedure: CYSTOSCOPY WITH STENT PLACEMENT- LIGHTED STENTS;  Surgeon: Hollice Espy, MD;  Location: [facility];  Service: Urology;  Laterality: Bilateral;  . ILEOSTOMY N/A 06/08/2017   Procedure: ILEOSTOMY;  Surgeon: Vickie Epley, MD;  Location: [facility];  Service: General;  Laterality: N/A;  . LAPAROSCOPIC SIGMOID COLECTOMY N/A 05/31/2017   Procedure: LAPAROSCOPIC SIGMOID COLECTOMY;  Surgeon: Clayburn Pert, MD;  Location: [facility];  Service: General;  Laterality: N/A;  . LAPAROTOMY N/A 06/08/2017   Procedure: EXPLORATORY LAPAROTOMY;  Surgeon: Vickie Epley, MD;  Location: [facility];   Service: General;  Laterality: N/A;  . TEE WITHOUT CARDIOVERSION  07/26/2012   Procedure: TRANSESOPHAGEAL ECHOCARDIOGRAM (TEE);  Surgeon: Thayer Headings, MD;  Location: [place];  Service: Cardiovascular;  Laterality: N/A;  . TONSILLECTOMY    . TOTAL THYROIDECTOMY      Family History  Problem Relation Age of Onset  . Nephrolithiasis Mother   . Hypertension Mother   . Hypertension Father   . Heart failure Father   . Cardiomyopathy Brother     Social History:  reports that she has been smoking cigarettes.  She has a 5.75 pack-year smoking history. she has never used smokeless tobacco. She reports that she does not drink alcohol or use drugs.  Allergies:  Allergies  Allergen Reactions  . Tape Dermatitis    Skin breakdown/dermatitis with prolonged use   . Latex Dermatitis    With prolonged use  NOT LATEX ALLERGIC.  Patient has problem with adhesive, not latex    Medications reviewed.    ROS Multipoint review of systems was completed, all pertinent positives and negatives are documented within the HPI and the remainder are negative   BP 131/86   Pulse 83   Temp 98.3 F (36.8 C) (Oral)   Wt 63 kg (139 lb)   BMI 24.62 kg/m   Physical Exam  General: No acute distress Neck: Supple and nontender Chest: Clear to auscultation Heart: Regular rate and rhythm Abdomen: Soft, nontender, nondistended.  Loop ileostomy present in the right upper quadrant that is pink, patent, productive of gas and stool. Extremities: Moves all extremities well.   No results found for this or any previous visit (from the past 48 hour(s)). Dg Colon W/cm - Wo/w Kub  Result Date: 09/12/2017 CLINICAL DATA:  History of ileostomy due to large bowel diverticulitis with abscess formation EXAM: BE WITH CONTRAST - WITHOUT AND WITH KUB CONTRAST:  Thin barium FLUOROSCOPY TIME:  Fluoroscopy Time:  1 minutes, 24 seconds Radiation Exposure Index (if provided by the fluoroscopic device): 1281 micro Gy per  meters square Number of Acquired Spot Images: 14 COMPARISON:  CT scan of the abdomen and pelvis of July 25, 2017 FINDINGS: The anticipated procedure was discussed with the patient. She voiced her willingness to proceed. The scout image revealed no acute abnormality. The barium catheter was placed in the rectum and the balloon inflated under fluoroscopic guidance. Barium was then instilled into the rectum. It passed easily retrograde into the rectosigmoid and then more proximally. Within the rectosigmoid small diverticula were observed but there was no evidence of a fistulous tract or high-grade stricture. The barium proceeded retrograde and scattered diverticular were noted in the descending, transverse, and ascending colon. A small amount of contrast reached the ileostomy bag. IMPRESSION: No evidence of obstruction, stricture, or fistulous tract. No evidence of acute diverticulitis. Scattered diverticula throughout the colon greatest in the rectosigmoid. Electronically Signed   By: David  Martinique M.D.   On: 09/12/2017 08:54    Assessment/Plan:  1. Ileostomy in place [facility]) 52 year old female with a loop ileostomy in place.  Discussed the results of her barium enema in detail.  Then discussed the procedure of a loop ileostomy takedown risk, benefits, alternatives.  Patient voiced understanding and strongly desires to proceed as soon as possible.  We will obtain preoperative imaging and labs and tentatively schedule for Thursday, January 10.  A total of 25 minutes was used on this encounter with greater than 50% of it used for counseling or coordination of care.   Clayburn Pert, MD FACS General Surgeon  09/12/2017,4:56 PM

## 2017-09-12 NOTE — Progress Notes (Signed)
Outpatient Surgical Follow Up  09/12/2017  Rebecca Obrien is an 52 y.o. female.   Chief Complaint  Patient presents with  . Routine Post Op    Discuss surgery date    HPI: 52 year old female returns to clinic for follow-up after having a barium enema this morning.  She is 3 months status post loop ileostomy creation for pelvic anastomotic leak.  Patient reports she is eating well and having good ostomy function.  She denies any pain, fevers, chills, nausea, vomiting, chest pain, shortness of breath.  Reports that the barium enema was uncomfortable but has completely resolved.  She strongly desires to have the ileostomy reversed as soon as possible  Past Medical History:  Diagnosis Date  . Arthritis    RA  . Cancer ([place])    skin melanoma  . Collagen vascular disease (HCC)    RA  . Difficult airway   . 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 Diverticulitis of sigmoid colon   . History of kidney stones   . Hyperlipidemia   . Hypertension   . Hyperthyroidism   . Hypokalemia   . Obesity   . Rheumatoid arthritis(714.0)   . Stroke [facility]) 2013   no residual    Past Surgical History:  Procedure Laterality Date  . ABDOMINAL HYSTERECTOMY    . CARPAL TUNNEL RELEASE  Bilateral  . CHOLECYSTECTOMY    . CYSTOSCOPY WITH STENT PLACEMENT Bilateral 05/31/2017   Procedure: CYSTOSCOPY WITH STENT PLACEMENT- LIGHTED STENTS;  Surgeon: Hollice Espy, MD;  Location: [facility];  Service: Urology;  Laterality: Bilateral;  . ILEOSTOMY N/A 06/08/2017   Procedure: ILEOSTOMY;  Surgeon: Vickie Epley, MD;  Location: [place] [facility];  Service: General;  Laterality: N/A;  . LAPAROSCOPIC SIGMOID COLECTOMY N/A 05/31/2017   Procedure: LAPAROSCOPIC SIGMOID COLECTOMY;  Surgeon: Clayburn Pert, MD;  Location: [facility];  Service: General;  Laterality: N/A;  . LAPAROTOMY N/A 06/08/2017   Procedure: EXPLORATORY LAPAROTOMY;  Surgeon: Vickie Epley, MD;  Location: [facility];   Service: General;  Laterality: N/A;  . TEE WITHOUT CARDIOVERSION  07/26/2012   Procedure: TRANSESOPHAGEAL ECHOCARDIOGRAM (TEE);  Surgeon: Thayer Headings, MD;  Location: [place];  Service: Cardiovascular;  Laterality: N/A;  . TONSILLECTOMY    . TOTAL THYROIDECTOMY      Family History  Problem Relation Age of Onset  . Nephrolithiasis Mother   . Hypertension Mother   . Hypertension Father   . Heart failure Father   . Cardiomyopathy Brother     Social History:  reports that she has been smoking cigarettes.  She has a 5.75 pack-year smoking history. she has never used smokeless tobacco. She reports that she does not drink alcohol or use drugs.  Allergies:  Allergies  Allergen Reactions  . Tape Dermatitis    Skin breakdown/dermatitis with prolonged use   . Latex Dermatitis    With prolonged use  NOT LATEX ALLERGIC.  Patient has problem with adhesive, not latex    Medications reviewed.    ROS Multipoint review of systems was completed, all pertinent positives and negatives are documented within the HPI and the remainder are negative   BP 131/86   Pulse 83   Temp 98.3 F (36.8 C) (Oral)   Wt 63 kg (139 lb)   BMI 24.62 kg/m   Physical Exam  General: No acute distress Neck: Supple and nontender Chest: Clear to auscultation Heart: Regular rate and rhythm Abdomen: Soft, nontender, nondistended.  Loop ileostomy present in the right upper quadrant that is pink, patent, productive of gas and stool. Extremities: Moves all extremities well.   No results found for this or any previous visit (from the past 48 hour(s)). Dg Colon W/cm - Wo/w Kub  Result Date: 09/12/2017 CLINICAL DATA:  History of ileostomy due to large bowel diverticulitis with abscess formation EXAM: BE WITH CONTRAST - WITHOUT AND WITH KUB CONTRAST:  Thin barium FLUOROSCOPY TIME:  Fluoroscopy Time:  1 minutes, 24 seconds Radiation Exposure Index (if provided by the fluoroscopic device): 1281 micro Gy per  meters square Number of Acquired Spot Images: 14 COMPARISON:  CT scan of the abdomen and pelvis of July 25, 2017 FINDINGS: The anticipated procedure was discussed with the patient. She voiced her willingness to proceed. The scout image revealed no acute abnormality. The barium catheter was placed in the rectum and the balloon inflated under fluoroscopic guidance. Barium was then instilled into the rectum. It passed easily retrograde into the rectosigmoid and then more proximally. Within the rectosigmoid small diverticula were observed but there was no evidence of a fistulous tract or high-grade stricture. The barium proceeded retrograde and scattered diverticular were noted in the descending, transverse, and ascending colon. A small amount of contrast reached the ileostomy bag. IMPRESSION: No evidence of obstruction, stricture, or fistulous tract. No evidence of acute diverticulitis. Scattered diverticula throughout the colon greatest in the rectosigmoid. Electronically Signed   By: David  Martinique M.D.   On: 09/12/2017 08:54    Assessment/Plan:  1. Ileostomy in place [facility]) 52 year old female with a loop ileostomy in place.  Discussed the results of her barium enema in detail.  Then discussed the procedure of a loop ileostomy takedown risk, benefits, alternatives.  Patient voiced understanding and strongly desires to proceed as soon as possible.  We will obtain preoperative imaging and labs and tentatively schedule for Thursday, January 10.  A total of 25 minutes was used on this encounter with greater than 50% of it used for counseling or coordination of care.   Clayburn Pert, MD FACS General Surgeon  09/12/2017,4:56 PM

## 2017-09-12 NOTE — Telephone Encounter (Signed)
Spoke with Angie from xray. She wanted to know if the barium enema scheduled for the patient is through both the rectum and ileostomy.  Per Dr.Woodham through rectum only.  Called Angie and let her know the above.

## 2017-09-12 NOTE — Patient Instructions (Signed)
You have chosen to have your Ileostomy Reversal on 09/15/2017 by Dr. Adonis Huguenin at [facility]. At this time, you are not required to do any prep.

## 2017-09-12 NOTE — Telephone Encounter (Signed)
Patient just had barium enema done this morning. Needs to know if Rebecca Obrien can see her this week instead of next week so she can schedule surgery sooner. Please advise

## 2017-09-13 ENCOUNTER — Telehealth: Payer: Self-pay | Admitting: General Surgery

## 2017-09-13 NOTE — Telephone Encounter (Signed)
Pt advised of pre op date/time and sx date. Sx: 09/15/17 with Dr Jolene Provost ileostomy takedown.  Pre op: 09/14/17 @ 9:30am--office interview.   Patient made aware to call [phone], between 1-3:00pm the day before surgery, to find out what time to arrive.

## 2017-09-14 ENCOUNTER — Encounter
Admission: RE | Admit: 2017-09-14 | Discharge: 2017-09-14 | Disposition: A | Discharge status: Home / Self Care | Payer: 59 | Source: Ambulatory Visit | Attending: General Surgery | Admitting: General Surgery

## 2017-09-14 ENCOUNTER — Other Ambulatory Visit: Payer: Self-pay

## 2017-09-14 HISTORY — DX: Other complications of anesthesia, initial encounter: T88.59XA

## 2017-09-14 HISTORY — DX: Anxiety disorder, unspecified: F41.9

## 2017-09-14 HISTORY — DX: Depression, unspecified: F32.A

## 2017-09-14 HISTORY — DX: Major depressive disorder, single episode, unspecified: F32.9

## 2017-09-14 HISTORY — DX: Adverse effect of unspecified anesthetic, initial encounter: T41.45XA

## 2017-09-14 LAB — CBC WITH DIFFERENTIAL/PLATELET
Basophils Absolute: 0 10*3/uL (ref 0–0.1)
Basophils Relative: 1 %
EOS PCT: 0 %
Eosinophils Absolute: 0 10*3/uL (ref 0–0.7)
HCT: 37.3 % (ref 35.0–47.0)
Hemoglobin: 12.6 g/dL (ref 12.0–16.0)
LYMPHS ABS: 1.6 10*3/uL (ref 1.0–3.6)
Lymphocytes Relative: 20 %
MCH: 29.5 pg (ref 26.0–34.0)
MCHC: 33.9 g/dL (ref 32.0–36.0)
MCV: 86.9 fL (ref 80.0–100.0)
MONO ABS: 0.3 10*3/uL (ref 0.2–0.9)
Monocytes Relative: 4 %
Neutro Abs: 6.1 10*3/uL (ref 1.4–6.5)
Neutrophils Relative %: 75 %
PLATELETS: 546 10*3/uL — AB (ref 150–440)
RBC: 4.29 MIL/uL (ref 3.80–5.20)
RDW: 16.6 % — AB (ref 11.5–14.5)
WBC: 8.1 10*3/uL (ref 3.6–11.0)

## 2017-09-14 LAB — BASIC METABOLIC PANEL
Anion gap: 12 (ref 5–15)
BUN: 32 mg/dL — AB (ref 6–20)
CHLORIDE: 96 mmol/L — AB (ref 101–111)
CO2: 22 mmol/L (ref 22–32)
Calcium: 9.6 mg/dL (ref 8.9–10.3)
Creatinine, Ser: 1.29 mg/dL — ABNORMAL HIGH (ref 0.44–1.00)
GFR calc Af Amer: 54 mL/min — ABNORMAL LOW (ref >60)
GFR calc non Af Amer: 46 mL/min — ABNORMAL LOW (ref >60)
GLUCOSE: 97 mg/dL (ref 65–99)
POTASSIUM: 4 mmol/L (ref 3.5–5.1)
Sodium: 130 mmol/L — ABNORMAL LOW (ref 135–145)

## 2017-09-14 MED ORDER — DEXTROSE 5 % IV SOLN
2.0000 g | INTRAVENOUS | Status: AC
  Administered 2017-09-15: 2 g via INTRAVENOUS
  Filled 2017-09-14: qty 2

## 2017-09-14 NOTE — Pre-Procedure Instructions (Signed)
Called Dr. Reginal Lutes office regarding recent  EKG and pre-op order for EKG.  Angie said to cancel the EKG and CXR order from Dr. Adonis Huguenin.

## 2017-09-14 NOTE — Patient Instructions (Signed)
  Your procedure is scheduled on: Thursday Jan. 10, 2019. Report to Same Day Surgery. To find out your arrival time please call [phone] between 1PM - 3PM today.  Remember: Instructions that are not followed completely may result in serious medical risk, up to and including death, or upon the discretion of your surgeon and anesthesiologist your surgery may need to be rescheduled.    _x___ 1. Do not eat food after midnight night prior to surgery.     No gum chewing or hard candies, snacks or breakfast.         ____ 2. No Alcohol for 24 hours before or after surgery.   ____ 3. Bring all medications with you on the day of surgery if instructed.    __x__ 4. Notify your doctor if there is any change in your medical condition     (cold, fever, infections).    __x___ 5.   Do Not Smoke or use e-cigarettes For 24 Hours Prior to Your   Surgery.  Do not use any chewable tobacco products for at least 6   hours prior to  surgery.                  Do not wear jewelry, make-up, hairpins, clips or nail polish.  Do not wear lotions, powders, or perfumes.   Do not shave 48 hours prior to surgery. Men may shave face and neck.  Do not bring valuables to the hospital.    [facility] is not responsible for any belongings or valuables.               Contacts, dentures or bridgework may not be worn into surgery.  Leave your suitcase in the car. After surgery it may be brought to your room.  For patients admitted to the hospital, discharge time is determined by your  treatment team.   Patients discharged the day of surgery will not be allowed to drive home.    Please read over the following fact sheets that you were given:   [facility] Preparing for Surgery             _x___ Take these medicines the morning of surgery with A SIP OF WATER:    1. Thyroid medicine/Synthroid  2. Anxiety medicine/Lorazepam  3. Tylenol optional     ____ Fleet Enema (as directed)   __x__ Use CHG Soap  as directed on instruction sheet  ____ Use inhalers on the day of surgery and bring to hospital day of surgery  ____ Stop metformin 2 days prior to surgery    ____ Take 1/2 of usual insulin dose the night before surgery and none on the morning of          surgery.   ____ Stop Eliquis/Coumadin/Plavix/aspirin on does not apply  __x__ Stop Anti-inflammatories such as Advil, Aleve, Ibuprofen, Motrin, Naproxen,  Naprosyn, Goodies powders or aspirin products. OK to take Tylenol or Percocet.   ____ Stop supplements until after surgery.    ____ Bring C-Pap to the hospital.

## 2017-09-15 ENCOUNTER — Inpatient Hospital Stay: Payer: 59 | Admitting: Registered Nurse

## 2017-09-15 ENCOUNTER — Encounter
Admission: RE | Disposition: A | Discharge status: Home / Self Care | Payer: Self-pay | Source: Ambulatory Visit | Attending: General Surgery

## 2017-09-15 ENCOUNTER — Inpatient Hospital Stay
Admission: RE | Admit: 2017-09-15 | Discharge: 2017-09-17 | DRG: 331 | Disposition: A | Discharge status: Home / Self Care | Payer: 59 | Source: Ambulatory Visit | Attending: General Surgery | Admitting: General Surgery

## 2017-09-15 DIAGNOSIS — Z9889 Other specified postprocedural states: Secondary | ICD-10-CM | POA: Diagnosis not present

## 2017-09-15 DIAGNOSIS — Z932 Ileostomy status: Secondary | ICD-10-CM | POA: Diagnosis not present

## 2017-09-15 DIAGNOSIS — I10 Essential (primary) hypertension: Secondary | ICD-10-CM | POA: Diagnosis not present

## 2017-09-15 DIAGNOSIS — Z87442 Personal history of urinary calculi: Secondary | ICD-10-CM

## 2017-09-15 DIAGNOSIS — F418 Other specified anxiety disorders: Secondary | ICD-10-CM | POA: Diagnosis not present

## 2017-09-15 DIAGNOSIS — M069 Rheumatoid arthritis, unspecified: Secondary | ICD-10-CM | POA: Diagnosis present

## 2017-09-15 DIAGNOSIS — Z432 Encounter for attention to ileostomy: Secondary | ICD-10-CM | POA: Diagnosis present

## 2017-09-15 DIAGNOSIS — F17210 Nicotine dependence, cigarettes, uncomplicated: Secondary | ICD-10-CM | POA: Diagnosis present

## 2017-09-15 DIAGNOSIS — Z85820 Personal history of malignant melanoma of skin: Secondary | ICD-10-CM | POA: Diagnosis not present

## 2017-09-15 DIAGNOSIS — E785 Hyperlipidemia, unspecified: Secondary | ICD-10-CM | POA: Diagnosis not present

## 2017-09-15 HISTORY — DX: Other specified postprocedural states: Z98.890

## 2017-09-15 HISTORY — PX: ILEOSTOMY CLOSURE: SHX1784

## 2017-09-15 SURGERY — CLOSURE, ILEOSTOMY
Anesthesia: General | Site: Abdomen | Wound class: Contaminated

## 2017-09-15 MED ORDER — DIPHENHYDRAMINE HCL 25 MG PO CAPS: 25.0000 mg | ORAL_CAPSULE | Freq: Four times a day (QID) | ORAL | Status: DC | PRN

## 2017-09-15 MED ORDER — LIDOCAINE HCL (CARDIAC) 20 MG/ML IV SOLN
INTRAVENOUS | Status: DC | PRN
  Administered 2017-09-15: 60 mg via INTRAVENOUS

## 2017-09-15 MED ORDER — LACTATED RINGERS IV SOLN
INTRAVENOUS | Status: DC
  Administered 2017-09-15 – 2017-09-16 (×3): via INTRAVENOUS

## 2017-09-15 MED ORDER — OXYCODONE-ACETAMINOPHEN 5-325 MG PO TABS
1.0000 | ORAL_TABLET | ORAL | Status: DC | PRN
  Administered 2017-09-15: 1 via ORAL
  Administered 2017-09-15 – 2017-09-16 (×2): 2 via ORAL
  Administered 2017-09-16: 1 via ORAL
  Administered 2017-09-16 – 2017-09-17 (×2): 2 via ORAL
  Filled 2017-09-15 (×2): qty 2
  Filled 2017-09-15: qty 1
  Filled 2017-09-15: qty 2
  Filled 2017-09-15: qty 1
  Filled 2017-09-15: qty 2

## 2017-09-15 MED ORDER — ONDANSETRON HCL 4 MG/2ML IJ SOLN
INTRAMUSCULAR | Status: DC | PRN
  Administered 2017-09-15: 4 mg via INTRAVENOUS

## 2017-09-15 MED ORDER — FAMOTIDINE 20 MG PO TABS
20.0000 mg | ORAL_TABLET | Freq: Once | ORAL | Status: AC
  Administered 2017-09-15: 20 mg via ORAL

## 2017-09-15 MED ORDER — LIDOCAINE HCL (PF) 2 % IJ SOLN
INTRAMUSCULAR | Status: AC
  Filled 2017-09-15: qty 10

## 2017-09-15 MED ORDER — SUGAMMADEX SODIUM 200 MG/2ML IV SOLN
INTRAVENOUS | Status: AC
  Filled 2017-09-15: qty 2

## 2017-09-15 MED ORDER — ROCURONIUM BROMIDE 50 MG/5ML IV SOLN
INTRAVENOUS | Status: AC
  Filled 2017-09-15: qty 1

## 2017-09-15 MED ORDER — FENTANYL CITRATE (PF) 100 MCG/2ML IJ SOLN
INTRAMUSCULAR | Status: AC
  Administered 2017-09-15: 25 ug via INTRAVENOUS
  Filled 2017-09-15: qty 2

## 2017-09-15 MED ORDER — CHLORHEXIDINE GLUCONATE CLOTH 2 % EX PADS: 6.0000 | MEDICATED_PAD | Freq: Once | CUTANEOUS | Status: DC

## 2017-09-15 MED ORDER — BUPIVACAINE HCL (PF) 0.5 % IJ SOLN
INTRAMUSCULAR | Status: DC | PRN
  Administered 2017-09-15 (×2): 5 mL

## 2017-09-15 MED ORDER — DEXAMETHASONE SODIUM PHOSPHATE 10 MG/ML IJ SOLN
INTRAMUSCULAR | Status: DC | PRN
  Administered 2017-09-15: 5 mg via INTRAVENOUS

## 2017-09-15 MED ORDER — DIPHENHYDRAMINE HCL 50 MG/ML IJ SOLN: 25.0000 mg | Freq: Four times a day (QID) | INTRAMUSCULAR | Status: DC | PRN

## 2017-09-15 MED ORDER — HYDRALAZINE HCL 20 MG/ML IJ SOLN: 10.0000 mg | INTRAMUSCULAR | Status: DC | PRN

## 2017-09-15 MED ORDER — ONDANSETRON HCL 4 MG/2ML IJ SOLN
4.0000 mg | Freq: Four times a day (QID) | INTRAMUSCULAR | Status: DC | PRN
  Filled 2017-09-15: qty 2

## 2017-09-15 MED ORDER — FENTANYL CITRATE (PF) 100 MCG/2ML IJ SOLN
INTRAMUSCULAR | Status: AC
  Administered 2017-09-15: 50 ug via INTRAVENOUS
  Filled 2017-09-15: qty 2

## 2017-09-15 MED ORDER — FENTANYL CITRATE (PF) 100 MCG/2ML IJ SOLN
25.0000 ug | INTRAMUSCULAR | Status: DC | PRN
  Administered 2017-09-15: 50 ug via INTRAVENOUS
  Administered 2017-09-15 (×4): 25 ug via INTRAVENOUS

## 2017-09-15 MED ORDER — ONDANSETRON 4 MG PO TBDP: 4.0000 mg | ORAL_TABLET | Freq: Four times a day (QID) | ORAL | Status: DC | PRN

## 2017-09-15 MED ORDER — SUCCINYLCHOLINE CHLORIDE 20 MG/ML IJ SOLN
INTRAMUSCULAR | Status: DC | PRN
  Administered 2017-09-15: 60 mg via INTRAVENOUS

## 2017-09-15 MED ORDER — MIDAZOLAM HCL 2 MG/2ML IJ SOLN
INTRAMUSCULAR | Status: DC | PRN
  Administered 2017-09-15: 2 mg via INTRAVENOUS

## 2017-09-15 MED ORDER — KETOROLAC TROMETHAMINE 30 MG/ML IJ SOLN
30.0000 mg | Freq: Four times a day (QID) | INTRAMUSCULAR | Status: DC | PRN
  Administered 2017-09-15 – 2017-09-16 (×3): 30 mg via INTRAVENOUS
  Filled 2017-09-15 (×3): qty 1

## 2017-09-15 MED ORDER — LIDOCAINE HCL (PF) 1 % IJ SOLN
INTRAMUSCULAR | Status: AC
  Filled 2017-09-15: qty 30

## 2017-09-15 MED ORDER — ONDANSETRON HCL 4 MG/2ML IJ SOLN
INTRAMUSCULAR | Status: AC
  Filled 2017-09-15: qty 2

## 2017-09-15 MED ORDER — MEPERIDINE HCL 50 MG/ML IJ SOLN: 6.2500 mg | INTRAMUSCULAR | Status: DC | PRN

## 2017-09-15 MED ORDER — METHOCARBAMOL 500 MG PO TABS
500.0000 mg | ORAL_TABLET | Freq: Four times a day (QID) | ORAL | Status: DC | PRN
  Filled 2017-09-15: qty 1

## 2017-09-15 MED ORDER — LACTATED RINGERS IV SOLN
INTRAVENOUS | Status: DC
  Administered 2017-09-15: 09:00:00 via INTRAVENOUS

## 2017-09-15 MED ORDER — PANTOPRAZOLE SODIUM 40 MG IV SOLR
40.0000 mg | Freq: Every day | INTRAVENOUS | Status: DC
  Administered 2017-09-15 – 2017-09-16 (×2): 40 mg via INTRAVENOUS
  Filled 2017-09-15 (×2): qty 40

## 2017-09-15 MED ORDER — ENOXAPARIN SODIUM 40 MG/0.4ML ~~LOC~~ SOLN
40.0000 mg | SUBCUTANEOUS | Status: DC
  Administered 2017-09-16: 40 mg via SUBCUTANEOUS
  Filled 2017-09-15: qty 0.4

## 2017-09-15 MED ORDER — FENTANYL CITRATE (PF) 100 MCG/2ML IJ SOLN
INTRAMUSCULAR | Status: DC | PRN
  Administered 2017-09-15: 50 ug via INTRAVENOUS
  Administered 2017-09-15: 100 ug via INTRAVENOUS
  Administered 2017-09-15: 50 ug via INTRAVENOUS

## 2017-09-15 MED ORDER — MIDAZOLAM HCL 2 MG/2ML IJ SOLN
INTRAMUSCULAR | Status: AC
  Filled 2017-09-15: qty 2

## 2017-09-15 MED ORDER — DEXAMETHASONE SODIUM PHOSPHATE 10 MG/ML IJ SOLN
INTRAMUSCULAR | Status: AC
Start: 2017-09-15 — End: 2017-09-15
  Filled 2017-09-15: qty 1

## 2017-09-15 MED ORDER — OXYCODONE HCL 5 MG PO TABS: 5.0000 mg | ORAL_TABLET | Freq: Once | ORAL | Status: DC | PRN

## 2017-09-15 MED ORDER — FENTANYL CITRATE (PF) 100 MCG/2ML IJ SOLN
INTRAMUSCULAR | Status: AC
  Filled 2017-09-15: qty 2

## 2017-09-15 MED ORDER — PROPOFOL 10 MG/ML IV BOLUS
INTRAVENOUS | Status: DC | PRN
  Administered 2017-09-15: 130 mg via INTRAVENOUS

## 2017-09-15 MED ORDER — ACETAMINOPHEN 10 MG/ML IV SOLN
INTRAVENOUS | Status: DC | PRN
  Administered 2017-09-15: 1000 mg via INTRAVENOUS

## 2017-09-15 MED ORDER — OXYCODONE HCL 5 MG/5ML PO SOLN: 5.0000 mg | Freq: Once | ORAL | Status: DC | PRN

## 2017-09-15 MED ORDER — KETOROLAC TROMETHAMINE 30 MG/ML IJ SOLN
INTRAMUSCULAR | Status: DC | PRN
  Administered 2017-09-15: 30 mg via INTRAVENOUS

## 2017-09-15 MED ORDER — FAMOTIDINE 20 MG PO TABS
ORAL_TABLET | ORAL | Status: AC
  Administered 2017-09-15: 20 mg via ORAL
  Filled 2017-09-15: qty 1

## 2017-09-15 MED ORDER — FENTANYL CITRATE (PF) 100 MCG/2ML IJ SOLN
50.0000 ug | Freq: Once | INTRAMUSCULAR | Status: AC
  Administered 2017-09-15: 50 ug via INTRAVENOUS

## 2017-09-15 MED ORDER — PROPOFOL 10 MG/ML IV BOLUS
INTRAVENOUS | Status: AC
  Filled 2017-09-15: qty 20

## 2017-09-15 MED ORDER — LIDOCAINE HCL (PF) 1 % IJ SOLN
INTRAMUSCULAR | Status: DC | PRN
  Administered 2017-09-15 (×2): 5 mL

## 2017-09-15 MED ORDER — MORPHINE SULFATE (PF) 4 MG/ML IV SOLN
4.0000 mg | INTRAVENOUS | Status: DC | PRN
  Administered 2017-09-15 – 2017-09-16 (×5): 4 mg via INTRAVENOUS
  Filled 2017-09-15 (×5): qty 1

## 2017-09-15 MED ORDER — BUPIVACAINE HCL (PF) 0.5 % IJ SOLN
INTRAMUSCULAR | Status: AC
  Filled 2017-09-15: qty 30

## 2017-09-15 MED ORDER — ROCURONIUM BROMIDE 100 MG/10ML IV SOLN
INTRAVENOUS | Status: DC | PRN
  Administered 2017-09-15: 40 mg via INTRAVENOUS

## 2017-09-15 MED ORDER — SUGAMMADEX SODIUM 200 MG/2ML IV SOLN
INTRAVENOUS | Status: DC | PRN
  Administered 2017-09-15: 150 mg via INTRAVENOUS

## 2017-09-15 MED ORDER — LORAZEPAM 0.5 MG PO TABS: 0.5000 mg | ORAL_TABLET | Freq: Two times a day (BID) | ORAL | Status: DC | PRN

## 2017-09-15 MED ORDER — PROMETHAZINE HCL 25 MG/ML IJ SOLN: 6.2500 mg | INTRAMUSCULAR | Status: DC | PRN

## 2017-09-15 SURGICAL SUPPLY — 30 items
CANISTER SUCT 1200ML W/VALVE (MISCELLANEOUS) ×3 IMPLANT
CHLORAPREP W/TINT 26ML (MISCELLANEOUS) ×3 IMPLANT
DRAIN PENROSE 1/4X12 LTX (DRAIN) ×3 IMPLANT
DRAPE LAPAROTOMY 100X77 ABD (DRAPES) ×3 IMPLANT
DRSG OPSITE POSTOP 4X6 (GAUZE/BANDAGES/DRESSINGS) ×3 IMPLANT
ELECT BLADE 6.5 EXT (BLADE) IMPLANT
ELECT CAUTERY BLADE 6.4 (BLADE) ×3 IMPLANT
ELECT REM PT RETURN 9FT ADLT (ELECTROSURGICAL) ×3
ELECTRODE REM PT RTRN 9FT ADLT (ELECTROSURGICAL) ×1 IMPLANT
GAUZE SPONGE 4X4 12PLY STRL (GAUZE/BANDAGES/DRESSINGS) ×3 IMPLANT
GLOVE BIO SURGEON STRL SZ7.5 (GLOVE) ×6 IMPLANT
GLOVE INDICATOR 8.0 STRL GRN (GLOVE) ×3 IMPLANT
GOWN STRL REUS W/ TWL LRG LVL3 (GOWN DISPOSABLE) ×1 IMPLANT
GOWN STRL REUS W/ TWL XL LVL3 (GOWN DISPOSABLE) ×1 IMPLANT
GOWN STRL REUS W/TWL LRG LVL3 (GOWN DISPOSABLE) ×2
GOWN STRL REUS W/TWL XL LVL3 (GOWN DISPOSABLE) ×2
HOLDER FOLEY CATH W/STRAP (MISCELLANEOUS) IMPLANT
LABEL OR SOLS (LABEL) ×3 IMPLANT
NEEDLE HYPO 22GX1.5 SAFETY (NEEDLE) ×3 IMPLANT
NS IRRIG 1000ML POUR BTL (IV SOLUTION) ×3 IMPLANT
PACK BASIN MAJOR ARMC (MISCELLANEOUS) ×3 IMPLANT
PAD ABD DERMACEA PRESS 5X9 (GAUZE/BANDAGES/DRESSINGS) ×6 IMPLANT
RELOAD PROXIMATE 75MM BLUE (ENDOMECHANICALS) ×3 IMPLANT
STAPLER PROXIMATE 75MM BLUE (STAPLE) ×3 IMPLANT
STAPLER SKIN PROX 35W (STAPLE) ×3 IMPLANT
SUT PDS #1 CTX NDL (SUTURE) ×6 IMPLANT
SUT SILK 2 0 SH (SUTURE) ×3 IMPLANT
SUT SILK 3-0 (SUTURE) ×3 IMPLANT
SYR 10ML LL (SYRINGE) ×3 IMPLANT
TRAY FOLEY W/METER SILVER 16FR (SET/KITS/TRAYS/PACK) IMPLANT

## 2017-09-15 NOTE — Op Note (Signed)
   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 with endotracheal tube  Assistant: As above  Procedure Details  The patient was seen again in the Holding Room. The benefits, complications, treatment options, and expected outcomes were discussed with the patient. The risks of bleeding, infection, recurrence of symptoms, failure to resolve symptoms,  bowel injury, any of which could require further surgery were reviewed with the patient.   The patient was taken to Operating Room, identified as Rebecca Obrien and the procedure verified.  A Time Out was held and the above information confirmed.  Prior to the induction of general anesthesia, antibiotic prophylaxis was administered. VTE prophylaxis was in place. General endotracheal anesthesia was then administered and tolerated well. After the induction, the ileostomy was sewn shut with a running 2-0 silk suture, the ileostomy site was then prepped with Betadine while the remainder of the abdomen was prepped with Chloraprep and draped in the sterile fashion. The patient was positioned in the supine position.  The procedure began with localization of the ostomy site with a 1% lidocaine and 0.5% Marcaine plain solution followed by an elliptical incision around the ostomy site.  Using electrocautery was taken down to the level of the fascia.  It was freed from the fascia circumferentially taking care to protect the bowel.  The mesentery was then dissected free.  The most terminal aspect of the loop ileostomy was then sharply excised with Metzenbaum scissors.  Any areas of bleeding was made hemostatic with direct electrocautery.  Then using a 75 mm blue load stapler at the bowel was reanastomosed in an  antimesenteric fashion.  The anastomosis visualized intact without any evidence of bleeding.  It was patulous on palpation.  The resulting enterotomy was then closed with an additional fire of the aforementioned stapler.  Mesentery was then sewn over the staple line after the staple line was ensured to be hemostatic.  Small bowel was then returned to the abdomen.  The fascia was closed with interrupted figure-of-eight #1 PDS suture.  The entire area was then closely irrigated.  The fascial closure and deeper tissues was again localized with the aforementioned local anesthetic.  Penrose drain was placed into the defect and secured with surgical staples.  The skin was reapproximated with staples as well.  The patient tolerated the procedure well.  There were no immediate complications.  She was awoken from general anesthesia and transferred to the PACU in good condition.  All counts were correct at the end of the procedure.  Findings: Loop ileostomy   Estimated Blood Loss: 20 mL         Drains: Penrose drain         Specimens: None          Complications: None                  Condition: Good   Clayburn Pert, MD, FACS

## 2017-09-15 NOTE — Transfer of Care (Signed)
Immediate Anesthesia Transfer of Care Note  Patient: Rebecca Obrien  Procedure(s) Performed: LOOP ILEOSTOMY TAKEDOWN (N/A Abdomen)  Patient Location: PACU  Anesthesia Type:General  Level of Consciousness: awake, alert  and oriented  Airway & Oxygen Therapy: Patient Spontanous Breathing and Patient connected to face mask oxygen  Post-op Assessment: Report given to RN and Post -op Vital signs reviewed and stable  Post vital signs: Reviewed and stable  Last Vitals:  Vitals:   09/15/17 0825 09/15/17 1130  BP: 116/90 140/90  Pulse: 96 98  Resp: 17 18  Temp: (!) 35.9 C (!) 36.3 C  SpO2: 100% 100%    Last Pain:  Vitals:   09/15/17 1130  TempSrc: Temporal  PainSc:          Complications: No apparent anesthesia complications

## 2017-09-15 NOTE — Anesthesia Postprocedure Evaluation (Signed)
Anesthesia Post Note  Patient: Rebecca Obrien  Procedure(s) Performed: LOOP ILEOSTOMY TAKEDOWN (N/A Abdomen)  Patient location during evaluation: PACU Anesthesia Type: General Level of consciousness: awake and alert and oriented Pain management: pain level controlled Vital Signs Assessment: post-procedure vital signs reviewed and stable Respiratory status: spontaneous breathing, nonlabored ventilation and respiratory function stable Cardiovascular status: blood pressure returned to baseline and stable Postop Assessment: no signs of nausea or vomiting Anesthetic complications: no     Last Vitals:  Vitals:   09/15/17 1214 09/15/17 1222  BP: 130/86   Pulse: 91 91  Resp: 12 11  Temp:    SpO2: 97% 97%    Last Pain:  Vitals:   09/15/17 1222  TempSrc:   PainSc: Asleep                 Lynnmarie Lovett

## 2017-09-15 NOTE — Anesthesia Preprocedure Evaluation (Signed)
Anesthesia Evaluation  Patient identified by MRN, date of birth, ID band Patient awake    Reviewed: Allergy & Precautions, NPO status , Patient's Chart, lab work & pertinent test results  History of Anesthesia Complications (+) DIFFICULT AIRWAY and history of anesthetic complications  Airway Mallampati: III  TM Distance: <3 FB Neck ROM: Full    Dental no notable dental hx.    Pulmonary neg sleep apnea, neg COPD, Current Smoker,    breath sounds clear to auscultation- rhonchi (-) wheezing      Cardiovascular Exercise Tolerance: Good hypertension, (-) CAD, (-) Past MI, (-) Cardiac Stents and (-) CABG  Rhythm:Regular Rate:Normal - Systolic murmurs and - Diastolic murmurs    Neuro/Psych PSYCHIATRIC DISORDERS Anxiety Depression CVA, No Residual Symptoms    GI/Hepatic negative GI ROS, Neg liver ROS,   Endo/Other  neg diabetesHypothyroidism   Renal/GU Renal InsufficiencyRenal disease     Musculoskeletal  (+) Arthritis , Rheumatoid disorders,    Abdominal (+) - obese,   Peds  Hematology negative hematology ROS (+)   Anesthesia Other Findings Past Medical History: No date: Anxiety No date: Arthritis     Comment:  RA No date: Cancer ([place])     Comment:  skin melanoma No date: Collagen vascular disease ([facility])     Comment:  RA No date: Complication of anesthesia     Comment:  difficult intubation No date: Depression No date: Difficult airway No date: Difficult intubation     Comment:  Very anterior airway. Small TMD, small mouth opening No date: Diverticulitis of sigmoid colon 1994: History of kidney stones     Comment:  x 1 No date: Hyperlipidemia No date: Hypertension     Comment:  history of No date: Hyperthyroidism No date: Hypokalemia No date: Obesity No date: Rheumatoid arthritis(714.0) 2013: Stroke ([place])     Comment:  no residual   Reproductive/Obstetrics                              Anesthesia Physical Anesthesia Plan  ASA: III  Anesthesia Plan: General   Post-op Pain Management:    Induction: Intravenous  PONV Risk Score and Plan: 1 and Ondansetron and Dexamethasone  Airway Management Planned: Oral ETT and Video Laryngoscope Planned  Additional Equipment:   Intra-op Plan:   Post-operative Plan: Extubation in OR  Informed Consent: I have reviewed the patients History and Physical, chart, labs and discussed the procedure including the risks, benefits and alternatives for the proposed anesthesia with the patient or authorized representative who has indicated his/her understanding and acceptance.   Dental advisory given  Plan Discussed with: CRNA and Anesthesiologist  Anesthesia Plan Comments:         Anesthesia Quick Evaluation

## 2017-09-15 NOTE — Anesthesia Post-op Follow-up Note (Signed)
Anesthesia QCDR form completed.        

## 2017-09-15 NOTE — Anesthesia Procedure Notes (Signed)
Procedure Name: Intubation Date/Time: 09/15/2017 10:01 AM Performed by: Hedda Slade, CRNA Pre-anesthesia Checklist: Patient identified, Patient being monitored, Timeout performed, Emergency Drugs available and Suction available Patient Re-evaluated:Patient Re-evaluated prior to induction Oxygen Delivery Method: Circle system utilized Preoxygenation: Pre-oxygenation with 100% oxygen Induction Type: IV induction Ventilation: Mask ventilation without difficulty Laryngoscope Size: 3 and McGraph Grade View: Grade I Tube type: Oral Tube size: 7.0 mm Number of attempts: 1 Airway Equipment and Method: Stylet Placement Confirmation: ETT inserted through vocal cords under direct vision,  positive ETCO2 and breath sounds checked- equal and bilateral Secured at: 21 cm Tube secured with: Tape Dental Injury: Teeth and Oropharynx as per pre-operative assessment  Difficulty Due To: Difficulty was anticipated, Difficult Airway- due to anterior larynx and Difficult Airway- due to limited oral opening Future Recommendations: Recommend- induction with short-acting agent, and alternative techniques readily available Comments: Atraumatic airway insertion first attempt.

## 2017-09-15 NOTE — Brief Op Note (Signed)
09/15/2017  11:25 AM  PATIENT:  Rebecca Obrien  52 y.o. female  PRE-OPERATIVE DIAGNOSIS:  ileostomy in place  POST-OPERATIVE DIAGNOSIS:  ileostomy in place  PROCEDURE:  Procedure(s): LOOP ILEOSTOMY TAKEDOWN (N/A)  SURGEON:  Surgeon(s) and Role:    * Clayburn Pert, MD - Primary  PHYSICIAN ASSISTANT:   ASSISTANTS: PA student  ANESTHESIA:   general  EBL:  20 mL   BLOOD ADMINISTERED:none  DRAINS: Penrose drain in the Previous ostomy location   LOCAL MEDICATIONS USED:  MARCAINE   , XYLOCAINE  and Amount: 20 ml  SPECIMEN:  No Specimen  DISPOSITION OF SPECIMEN:  N/A  COUNTS:  YES  TOURNIQUET:  * No tourniquets in log *  DICTATION: .Dragon Dictation  PLAN OF CARE: Admit to inpatient   PATIENT DISPOSITION:  PACU - hemodynamically stable.   Delay start of Pharmacological VTE agent (>24hrs) due to surgical blood loss or risk of bleeding: no

## 2017-09-15 NOTE — Interval H&P Note (Signed)
History and Physical Interval Note:  09/15/2017 8:22 AM  Rebecca Obrien  has presented today for surgery, with the diagnosis of ileostomy in place  The various methods of treatment have been discussed with the patient and family. After consideration of risks, benefits and other options for treatment, the patient has consented to  Procedure(s): LOOP ILEOSTOMY TAKEDOWN (N/A) as a surgical intervention .  The patient's history has been reviewed, patient examined, no change in status, stable for surgery.  I have reviewed the patient's chart and labs.  Questions were answered to the patient's satisfaction.     Clayburn Pert

## 2017-09-16 ENCOUNTER — Encounter: Payer: Self-pay | Admitting: General Surgery

## 2017-09-16 LAB — CBC
HCT: 33.2 % — ABNORMAL LOW (ref 35.0–47.0)
HEMOGLOBIN: 10.9 g/dL — AB (ref 12.0–16.0)
MCH: 28.6 pg (ref 26.0–34.0)
MCHC: 32.9 g/dL (ref 32.0–36.0)
MCV: 87.1 fL (ref 80.0–100.0)
Platelets: 456 10*3/uL — ABNORMAL HIGH (ref 150–440)
RBC: 3.82 MIL/uL (ref 3.80–5.20)
RDW: 16.3 % — ABNORMAL HIGH (ref 11.5–14.5)
WBC: 9.4 10*3/uL (ref 3.6–11.0)

## 2017-09-16 LAB — BASIC METABOLIC PANEL
Anion gap: 10 (ref 5–15)
BUN: 31 mg/dL — ABNORMAL HIGH (ref 6–20)
CALCIUM: 8.8 mg/dL — AB (ref 8.9–10.3)
CHLORIDE: 100 mmol/L — AB (ref 101–111)
CO2: 22 mmol/L (ref 22–32)
CREATININE: 1.35 mg/dL — AB (ref 0.44–1.00)
GFR calc Af Amer: 51 mL/min — ABNORMAL LOW (ref >60)
GFR calc non Af Amer: 44 mL/min — ABNORMAL LOW (ref >60)
Glucose, Bld: 95 mg/dL (ref 65–99)
Potassium: 4.6 mmol/L (ref 3.5–5.1)
SODIUM: 132 mmol/L — AB (ref 135–145)

## 2017-09-16 NOTE — Progress Notes (Signed)
Nutrition Brief Note  Patient identified on the Malnutrition Screening Tool (MST) Report  52 year old female status post loop ileostomy takedown.  RD familiar with this pt from previous admits. Pt with h/o ileostomy and now s/p takedown. Pt with initial weight loss of ~30lbs after her first surgery but her recent wt loss has only been about 9lbs(6%) in 2 months which is not significant. Pt eating food brought from outside the hospital this morning. Pt declines supplements at this time. Pt to possibly discharge tomorrow.    Wt Readings from Last 15 Encounters:  09/15/17 138 lb (62.6 kg)  09/14/17 138 lb (62.6 kg)  09/12/17 139 lb (63 kg)  07/26/17 140 lb (63.5 kg)  07/26/17 140 lb (63.5 kg)  07/20/17 143 lb 6.4 oz (65 kg)  07/14/17 147 lb (66.7 kg)  07/06/17 160 lb 4.8 oz (72.7 kg)  07/01/17 176 lb (79.8 kg)  06/14/17 176 lb 14.4 oz (80.2 kg)  05/31/17 168 lb (76.2 kg)  05/24/17 168 lb (76.2 kg)  05/18/17 170 lb 3.2 oz (77.2 kg)  05/13/17 171 lb 6.4 oz (77.7 kg)  05/03/17 167 lb 6.4 oz (75.9 kg)    Body mass index is 23.69 kg/m. Patient meets criteria for normal weight based on current BMI.   Current diet order is soft, patient is consuming approximately 100% of meals at this time. Labs and medications reviewed.   No nutrition interventions warranted at this time. If nutrition issues arise, please consult RD.   Koleen Distance MS, RD, LDN Pager #- [phone] After Hours Pager: [phone]

## 2017-09-16 NOTE — Progress Notes (Signed)
1 Day Post-Op   Subjective: Patient reports that her pain is much improved overnight.  She is actually had 2 bowel movements and has been passing flatus.  She has been tolerating her clear liquid diet.  Vital signs in last 24 hours: Temp:  [97.4 F (36.3 C)-98.1 F (36.7 C)] 97.5 F (36.4 C) (01/11 0521) Pulse Rate:  [55-104] 55 (01/11 0521) Resp:  [11-18] 18 (01/11 0521) BP: (108-140)/(59-90) 108/59 (01/11 0521) SpO2:  [96 %-100 %] 100 % (01/11 0521) Last BM Date: 09/15/17  Intake/Output from previous day: 01/10 0701 - 01/11 0700 In: 2713.5 [I.V.:2713.5] Out: 220 [Urine:200; Blood:20]  GI: Abdomen is soft, appropriately tender to palpation at the incision sites, nondistended.  Lab Results:  CBC Recent Labs    09/14/17 1012 09/16/17 0347  WBC 8.1 9.4  HGB 12.6 10.9*  HCT 37.3 33.2*  PLT 546* 456*   CMP     Component Value Date/Time   NA 132 (L) 09/16/2017 0347   NA 141 07/23/2012 1146   K 4.6 09/16/2017 0347   K 3.3 (L) 07/23/2012 1146   CL 100 (L) 09/16/2017 0347   CL 106 07/23/2012 1146   CO2 22 09/16/2017 0347   CO2 28 07/23/2012 1146   GLUCOSE 95 09/16/2017 0347   GLUCOSE 93 07/23/2012 1146   BUN 31 (H) 09/16/2017 0347   BUN 13 07/23/2012 1146   CREATININE 1.35 (H) 09/16/2017 0347   CREATININE 0.60 07/23/2012 1146   CALCIUM 8.8 (L) 09/16/2017 0347   CALCIUM 9.2 07/23/2012 1146   PROT 8.5 (H) 07/26/2017 1846   PROT 7.8 07/23/2012 1146   ALBUMIN 3.8 07/26/2017 1846   ALBUMIN 4.1 07/23/2012 1146   AST 22 07/26/2017 1846   AST 26 07/23/2012 1146   ALT 16 07/26/2017 1846   ALT 44 07/23/2012 1146   ALKPHOS 142 (H) 07/26/2017 1846   ALKPHOS 98 07/23/2012 1146   BILITOT 0.6 07/26/2017 1846   BILITOT 0.6 07/23/2012 1146   GFRNONAA 44 (L) 09/16/2017 0347   GFRNONAA >60 07/23/2012 1146   GFRAA 51 (L) 09/16/2017 0347   GFRAA >60 07/23/2012 1146   PT/INR No results for input(s): LABPROT, INR in the last 72 hours.  Studies/Results: No results  found.  Assessment/Plan: 52 year old female status post loop ileostomy takedown.  Tolerating clears and already having bowel function.  Plan to slowly advance her diet.  Encourage ambulation and incentive spirometer usage.   Clayburn Pert, MD [facility]  Day [facility] [phone] Night [facility] [phone]  09/16/2017

## 2017-09-17 LAB — CBC
HCT: 32.2 % — ABNORMAL LOW (ref 35.0–47.0)
HEMOGLOBIN: 10.8 g/dL — AB (ref 12.0–16.0)
MCH: 29.1 pg (ref 26.0–34.0)
MCHC: 33.6 g/dL (ref 32.0–36.0)
MCV: 86.8 fL (ref 80.0–100.0)
PLATELETS: 402 10*3/uL (ref 150–440)
RBC: 3.71 MIL/uL — ABNORMAL LOW (ref 3.80–5.20)
RDW: 16.5 % — AB (ref 11.5–14.5)
WBC: 5.3 10*3/uL (ref 3.6–11.0)

## 2017-09-17 LAB — BASIC METABOLIC PANEL
Anion gap: 6 (ref 5–15)
BUN: 23 mg/dL — AB (ref 6–20)
CALCIUM: 8.6 mg/dL — AB (ref 8.9–10.3)
CHLORIDE: 106 mmol/L (ref 101–111)
CO2: 24 mmol/L (ref 22–32)
CREATININE: 1 mg/dL (ref 0.44–1.00)
GFR calc Af Amer: >60 mL/min (ref >60)
Glucose, Bld: 87 mg/dL (ref 65–99)
Potassium: 4.1 mmol/L (ref 3.5–5.1)
SODIUM: 136 mmol/L (ref 135–145)

## 2017-09-17 MED ORDER — OXYCODONE-ACETAMINOPHEN 5-325 MG PO TABS: 1.0000 | ORAL_TABLET | ORAL | 0 refills | Status: DC | PRN

## 2017-09-17 MED ORDER — METHOCARBAMOL 500 MG PO TABS: 500.0000 mg | ORAL_TABLET | Freq: Four times a day (QID) | ORAL | 0 refills | Status: DC | PRN

## 2017-09-17 NOTE — Discharge Summary (Signed)
Patient ID: Rebecca Obrien MRN: [id number] DOB/AGE: [birth year] 54 y.o.  Admit date: 09/15/2017 Discharge date: 09/17/2017  Discharge Diagnoses:  History of ileostomy  Procedures Performed: Loop ileostomy takedown  Discharged Condition: good  Hospital Course: Patient taken to OR for a scheduled takedown of her loop ileostomy. She tolerated the procedure well. On the day of discharge she was tolerating a regular diet, her pain was controlled with oral medications and her abdomen was benign. Her staples and penrose drain were in place without evidence of infection.  Discharge Orders: Discharge Instructions    Call MD for:  difficulty breathing, headache or visual disturbances   Complete by:  As directed    Call MD for:  persistant nausea and vomiting   Complete by:  As directed    Call MD for:  redness, tenderness, or signs of infection (pain, swelling, redness, odor or green/yellow discharge around incision site)   Complete by:  As directed    Call MD for:  severe uncontrolled pain   Complete by:  As directed    Call MD for:  temperature >100.4   Complete by:  As directed    Diet - low sodium heart healthy   Complete by:  As directed    Increase activity slowly   Complete by:  As directed       Disposition: 01-Home or Self Care  Discharge Medications: Allergies as of 09/17/2017      Reactions   Tape Dermatitis, Other (See Comments)   Skin breakdown/dermatitis with prolonged use       Medication List    TAKE these medications   levothyroxine 112 MCG tablet Commonly known as:  SYNTHROID, LEVOTHROID Take 112 mcg by mouth daily before breakfast.   LORazepam 0.5 MG tablet Commonly known as:  ATIVAN Take 1 tablet (0.5 mg total) by mouth 2 (two) times daily as needed for anxiety. What changed:    when to take this  additional instructions   methocarbamol 500 MG tablet Commonly known as:  ROBAXIN Take 1 tablet (500 mg total) by mouth every 6 (six) hours as needed  for muscle spasms.   naproxen sodium 220 MG tablet Commonly known as:  ALEVE Take 440 mg by mouth daily as needed (pain).   oxyCODONE-acetaminophen 5-325 MG tablet Commonly known as:  PERCOCET/ROXICET Take 1-2 tablets by mouth every 4 (four) hours as needed for severe pain. What changed:  Another medication with the same name was added. Make sure you understand how and when to take each.   oxyCODONE-acetaminophen 5-325 MG tablet Commonly known as:  PERCOCET/ROXICET Take 1-2 tablets by mouth every 4 (four) hours as needed for moderate pain. What changed:  You were already taking a medication with the same name, and this prescription was added. Make sure you understand how and when to take each.        Follwup: Follow-up Information    Clayburn Pert, MD. Go in 5 day(s).   Specialty:  General Surgery Why:  Report to clinic at 2:30 for a 2:45pm appointment on Thursday 1/17 with Dr. Mariann Barter information: [address] [address] [phone]           Signed: Clayburn Pert 09/17/2017, 8:03 AM

## 2017-09-17 NOTE — Progress Notes (Signed)
Rebecca Obrien to be D/C'd Home per MD order.  Discussed prescriptions and follow up appointments with the patient. Prescriptions given to patient, medication list explained in detail. Pt verbalized understanding.  Allergies as of 09/17/2017      Reactions   Tape Dermatitis, Other (See Comments)   Skin breakdown/dermatitis with prolonged use       Medication List    TAKE these medications   levothyroxine 112 MCG tablet Commonly known as:  SYNTHROID, LEVOTHROID Take 112 mcg by mouth daily before breakfast.   LORazepam 0.5 MG tablet Commonly known as:  ATIVAN Take 1 tablet (0.5 mg total) by mouth 2 (two) times daily as needed for anxiety. What changed:    when to take this  additional instructions   methocarbamol 500 MG tablet Commonly known as:  ROBAXIN Take 1 tablet (500 mg total) by mouth every 6 (six) hours as needed for muscle spasms.   naproxen sodium 220 MG tablet Commonly known as:  ALEVE Take 440 mg by mouth daily as needed (pain).   oxyCODONE-acetaminophen 5-325 MG tablet Commonly known as:  PERCOCET/ROXICET Take 1-2 tablets by mouth every 4 (four) hours as needed for severe pain. What changed:  Another medication with the same name was added. Make sure you understand how and when to take each.   oxyCODONE-acetaminophen 5-325 MG tablet Commonly known as:  PERCOCET/ROXICET Take 1-2 tablets by mouth every 4 (four) hours as needed for moderate pain. What changed:  You were already taking a medication with the same name, and this prescription was added. Make sure you understand how and when to take each.       Vitals:   09/16/17 2101 09/17/17 0542  BP: 130/69 108/67  Pulse: 80 72  Resp:    Temp: 98.2 F (36.8 C) 98 F (36.7 C)  SpO2: 100% 97%    Skin clean, dry and intact without evidence of skin break down, no evidence of skin tears noted. IV catheter discontinued intact. Site without signs and symptoms of complications. Dressing and pressure applied. Pt  denies pain at this time. No complaints noted.  An After Visit Summary was printed and given to the patient. Patient escorted via [place], and D/C home via private auto.  Carlos American Rebecca Obrien 2

## 2017-09-17 NOTE — Discharge Instructions (Signed)
Incision Care, Adult An incision is a surgical cut that is made through your skin. Most incisions are closed after surgery. Your incision may be closed with stitches (sutures), staples, skin glue, or adhesive strips. You may need to return to your health care provider to have sutures or staples removed. This may occur several days to several weeks after your surgery. The incision needs to be cared for properly to prevent infection. How to care for your incision Incision care   Follow instructions from your health care provider about how to take care of your incision. Make sure you: ? Wash your hands with soap and water before you change the bandage (dressing). If soap and water are not available, use hand sanitizer. ? Change your dressing as told by your health care provider. Change dressing daily after showers. ? Leave sutures, skin glue, or adhesive strips in place. These skin closures may need to stay in place for 2 weeks or longer. If adhesive strip edges start to loosen and curl up, you may trim the loose edges. Do not remove adhesive strips completely unless your health care provider tells you to do that.  Check your incision area every day for signs of infection. Check for: ? More redness, swelling, or pain. ? More fluid or blood. ? Warmth. ? Pus or a bad smell.  Ask your health care provider how to clean the incision. This may include: ? Using mild soap and water. ? Using a clean towel to pat the incision dry after cleaning it. ? Applying a cream or ointment. Do this only as told by your health care provider. ? Covering the incision with a clean dressing.  Ask your health care provider when you can leave the incision uncovered.  Do not take baths, swim, or use a hot tub until your health care provider approves. Ask your health care provider if you can take showers. You may only be allowed to take sponge baths for bathing. OK to shower evening of discharge. Medicines  If you were  prescribed an antibiotic medicine, cream, or ointment, take or apply the antibiotic as told by your health care provider. Do not stop taking or applying the antibiotic even if your condition improves.  Take over-the-counter and prescription medicines only as told by your health care provider. General instructions  Limit movement around your incision to improve healing. ? Avoid straining, lifting, or exercise for the first month, or for as long as told by your health care provider. ? Follow instructions from your health care provider about returning to your normal activities. ? Ask your health care provider what activities are safe.  Protect your incision from the sun when you are outside for the first 6 months, or for as long as told by your health care provider. Apply sunscreen around the scar or cover it up.  Keep all follow-up visits as told by your health care provider. This is important. Contact a health care provider if:  Your have more redness, swelling, or pain around the incision.  You have more fluid or blood coming from the incision.  Your incision feels warm to the touch.  You have pus or a bad smell coming from the incision.  You have a fever or shaking chills.  You are nauseous or you vomit.  You are dizzy.  Your sutures or staples come undone. Get help right away if:  You have a red streak coming from your incision.  Your incision bleeds through the dressing and the  bleeding does not stop with gentle pressure.  The edges of your incision open up and separate.  You have severe pain.  You have a rash.  You are confused.  You faint.  You have trouble breathing and a fast heartbeat. This information is not intended to replace advice given to you by your health care provider. Make sure you discuss any questions you have with your health care provider. Document Released: 03/12/2005 Document Revised: 04/30/2016 Document Reviewed: 03/10/2016 Elsevier  Interactive Patient Education  [employer].

## 2017-09-19 ENCOUNTER — Encounter: Payer: Self-pay | Admitting: *Deleted

## 2017-09-19 ENCOUNTER — Other Ambulatory Visit: Payer: Self-pay | Admitting: *Deleted

## 2017-09-19 NOTE — Patient Outreach (Signed)
[facility]) Care Management  09/19/2017  Rebecca Obrien 1966-06-10 [id number]   Subjective: Telephone call to patient's home / mobile number, spoke with patient, and HIPAA verified.  Discussed [facility] Care Management UMR Transition of care follow up, patient voiced understanding, and is in agreement to follow up.   Patient states she remembers speaking with this RNCM in the past, is celebrating a successful colostomy take down, thing are going well, and has a follow up appointment with surgeon on 09/22/17.  Patient states she is able to manage self care and has assistance as needed.  Patient voices understanding of medical diagnosis, surgery, and treatment plan. States she is accessing the following [facility] benefits: outpatient pharmacy, hospital indemnity (verbally given contact number for [employer] [phone], contact number for [facility] [employer] [phone] for itemized bill request), and  has family medical leave act (FMLA) in place. Patient states she does not have any education material, transition of care, care coordination, disease management, disease monitoring, transportation, community resource, or pharmacy needs at this time.  States she is very appreciative of the follow up and is in agreement to receive [place] Management information.     Objective: Per KPN (Knowledge Performance Now, point of care tool) and chart review, patient hospitalized 09/15/17 -07/18/18 for Loop ileostomy takedown.   Patient hospitalized 07/01/17 -07/06/17 for dehydration.Patient has ED visit on 06/22/17 for Postoperative pain and ostomy leaking. Patient hospitalized 06/08/17 - 06/20/17 for Large intestine anastomotic leak. Status post Exploratory laparotomy, washout of stool/abscess, Placement of pelvic 46F Blake drains x 2, Creation of diverting loop ileostomy on 06/09/17. Patient hospitalized 05/2517 -05/2817 for persistent diverticulitis. Status post Laparoscopic sigmoid colectomy with  immediate anastomosis, mobilization of splenic flexure on 05/31/17. Patient hospitalized 05/03/17 - 05/07/17 for Diverticulitis of intestine with abscess. Patient hospitalized 04/27/17 -04/30/17 for Diverticulitis with microperforation. Patient also has a history of Rheumatoid arthritis, hypertension, hyperlipidemia, skin melanomaand stroke.  [facility] Care Management closed patient's case on 07/11/17 due to unable to reach.     Assessment:Received UMR Transition of care referral on 09/14/17.   Transition of care follow up completed, no care management needs, and will proceed with case closure.      Plan:RNCM will send patient successful outreach letter, [facility] pamphlet, and magnet. RNCM will send case closure due to follow up completed / no care management needs request to [facility].      Rebecca Obrien Rebecca Obrien Friendly, BSN, [place] Management [facility] Telephonic CM Phone: [phone] Fax: [phone]

## 2017-09-21 ENCOUNTER — Other Ambulatory Visit: Payer: Self-pay

## 2017-09-21 ENCOUNTER — Ambulatory Visit: Payer: Self-pay | Admitting: General Surgery

## 2017-09-22 ENCOUNTER — Ambulatory Visit (INDEPENDENT_AMBULATORY_CARE_PROVIDER_SITE_OTHER): Payer: 59 | Admitting: General Surgery

## 2017-09-22 ENCOUNTER — Encounter: Payer: Self-pay | Admitting: General Surgery

## 2017-09-22 VITALS — BP 126/83 | HR 92 | Temp 98.0°F | Ht 64.0 in | Wt 148.0 lb

## 2017-09-22 DIAGNOSIS — Z4889 Encounter for other specified surgical aftercare: Secondary | ICD-10-CM

## 2017-09-22 MED ORDER — OXYCODONE-ACETAMINOPHEN 5-325 MG PO TABS: 1.0000 | ORAL_TABLET | ORAL | 0 refills | Status: DC | PRN

## 2017-09-22 NOTE — Progress Notes (Signed)
Outpatient Surgical Follow Up  09/22/2017  Rebecca Obrien is an 52 y.o. female.   Chief Complaint  Patient presents with  . Routine Post Op    Loop ileostomy takedown- 1/10 Dr Adonis Huguenin    HPI: 52 year old female returns to clinic now 1 week status post loop ileostomy takedown.  She reports she is eating well and gaining weight.  She is having multiple loose bowel movements every day.  Primarily after she eats.  She denies any fevers, chills, nausea, vomiting, chest pain, shortness of breath.  Past Medical History:  Diagnosis Date  . Anxiety   . Arthritis    RA  . Cancer ([place])    skin melanoma  . Collagen vascular disease (HCC)    RA  . Complication of anesthesia    difficult intubation  . Depression   . Difficult airway   . 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 Diverticulitis of sigmoid colon   . History of kidney stones 1994   x 1  . Hyperlipidemia   . Hypertension    history of  . Hyperthyroidism   . Hypokalemia   . Obesity   . Rheumatoid arthritis(714.0)   . Stroke [facility]) 2013   no residual    Past Surgical History:  Procedure Laterality Date  . ABDOMINAL HYSTERECTOMY    . CARPAL TUNNEL RELEASE  Bilateral  . CHOLECYSTECTOMY    . CYSTOSCOPY WITH STENT PLACEMENT Bilateral 05/31/2017   Procedure: CYSTOSCOPY WITH STENT PLACEMENT- LIGHTED STENTS;  Surgeon: Hollice Espy, MD;  Location: [facility] ORS;  Service: Urology;  Laterality: Bilateral;  . EXCISION MELANOMA WITH SENTINEL LYMPH NODE BIOPSY Left 12/2016   Left calf with lymph nodes removed in left groin.  . ILEOSTOMY N/A 06/08/2017   Procedure: ILEOSTOMY;  Surgeon: Vickie Epley, MD;  Location: [place] [facility]S;  Service: General;  Laterality: N/A;  . ILEOSTOMY CLOSURE N/A 09/15/2017   Procedure: LOOP ILEOSTOMY TAKEDOWN;  Surgeon: Clayburn Pert, MD;  Location: [facility] ORS;  Service: General;  Laterality: N/A;  . LAPAROSCOPIC SIGMOID COLECTOMY N/A 05/31/2017   Procedure:  LAPAROSCOPIC SIGMOID COLECTOMY;  Surgeon: Clayburn Pert, MD;  Location: [facility] ORS;  Service: General;  Laterality: N/A;  . LAPAROTOMY N/A 06/08/2017   Procedure: EXPLORATORY LAPAROTOMY;  Surgeon: Vickie Epley, MD;  Location: [facility] ORS;  Service: General;  Laterality: N/A;  . TEE WITHOUT CARDIOVERSION  07/26/2012   Procedure: TRANSESOPHAGEAL ECHOCARDIOGRAM (TEE);  Surgeon: Thayer Headings, MD;  Location: [place];  Service: Cardiovascular;  Laterality: N/A;  . TONSILLECTOMY    . TOTAL THYROIDECTOMY      Family History  Problem Relation Age of Onset  . Nephrolithiasis Mother   . Hypertension Mother   . Hypertension Father   . Heart failure Father   . Cardiomyopathy Brother     Social History:  reports that she has been smoking cigarettes.  She has a 2.30 pack-year smoking history. she has never used smokeless tobacco. She reports that she does not drink alcohol or use drugs.  Allergies:  Allergies  Allergen Reactions  . Tape Dermatitis and Other (See Comments)    Skin breakdown/dermatitis with prolonged use     Medications reviewed.    ROS A multipoint review of systems was completed, all pertinent positives and negatives are documented within the HPI and the remainder are negative   Ht 5\' 4"  (1.626 m)   BMI 23.69 kg/m   Physical Exam General: No acute distress chest: Clear  to auscultation excellent heart: Regular rate and rhythm Abdomen: Soft, nondistended, appropriately tender to palpation at the incision sites.  Right hemiabdomen incision site with staples and Penrose drain still in place.  No evidence of spreading erythema or purulence.    No results found for this or any previous visit (from the past 48 hour(s)). No results found.  Assessment/Plan:  1. Aftercare following surgery 52 year old female 1 week status post loop ileostomy takedown.  Penrose drain and half of her staples removed today without any difficulty.  Staples were replaced with  Steri-Strips.  Discussed the signs and symptoms of infection and to return to clinic immediately should they occur.  Otherwise she will follow-up in clinic next week for an additional wound check and to have the remainder of her staples removed.  I will see her back in clinic the week after that.  We will provide her with a small prescription for additional pain medications this visit.     Clayburn Pert, MD FACS General Surgeon  09/22/2017,4:05 PM

## 2017-09-22 NOTE — Patient Instructions (Signed)
We have sent you home with a refill of your pain medication. We have removed your drain as well as half of your staples this area will continue to drain. Change the dressing once daily.   We will see you back in office as listed below:

## 2017-09-26 ENCOUNTER — Telehealth: Payer: Self-pay

## 2017-09-26 NOTE — Telephone Encounter (Signed)
Return to work letter faxed to [employer] at this time per patient request.

## 2017-09-29 ENCOUNTER — Ambulatory Visit (INDEPENDENT_AMBULATORY_CARE_PROVIDER_SITE_OTHER): Payer: 59 | Admitting: Surgery

## 2017-09-29 ENCOUNTER — Encounter: Payer: Self-pay | Admitting: Surgery

## 2017-09-29 VITALS — BP 150/89 | HR 99 | Temp 98.2°F | Ht 64.0 in | Wt 152.4 lb

## 2017-09-29 DIAGNOSIS — K5720 Diverticulitis of large intestine with perforation and abscess without bleeding: Secondary | ICD-10-CM

## 2017-09-29 DIAGNOSIS — K9189 Other postprocedural complications and disorders of digestive system: Secondary | ICD-10-CM

## 2017-09-29 NOTE — Progress Notes (Signed)
Surgical Clinic Progress/Follow-up Note   HPI:  52 y.o. Female presents to clinic for post-op follow-up wound evaluation s/p reversal of diverting ileostomy. Patient reports she has been feeling overall well with improved appetite and tolerating diet with weight gain of 10 lbs and gradually normalizing still-loose BM's >once daily, denies abdominal pain, N/V, fever/chills, CP, or SOB.  Review of Systems:  Constitutional: denies any other weight loss, fever, chills, or sweats  Eyes: denies any other vision changes, history of eye injury  ENT: denies sore throat, hearing problems  Respiratory: denies shortness of breath, wheezing  Cardiovascular: denies chest pain, palpitations  Gastrointestinal: abdominal pain, N/V, and bowel function as per HPI Musculoskeletal: denies any other joint pains or cramps  Skin: Denies any other rashes or skin discolorations  Neurological: denies any other headache, dizziness, weakness  Psychiatric: denies any other depression, anxiety  All other review of systems: otherwise negative   Vital Signs:  BP (!) 150/89   Pulse 99   Temp 98.2 F (36.8 C) (Oral)   Ht 5\' 4"  (1.626 m)   Wt 152 lb 6.4 oz (69.1 kg)   BMI 26.16 kg/m   Physical Exam:  Constitutional:  -- Normal body habitus  -- Awake, alert, and oriented x3  Eyes:  -- Pupils equally round and reactive to light  -- No scleral icterus  Ear, nose, throat:  -- No jugular venous distension  -- No nasal drainage, bleeding Pulmonary:  -- No crackles -- Equal breath sounds bilaterally -- Breathing non-labored at rest Cardiovascular:  -- S1, S2 present  -- No pericardial rubs  Gastrointestinal:  -- Soft, nontender, non-distended, no guarding/rebound -- Slight separation of medial post-surgical wound remote from remaining staples without any erythema or drainage; segment of wound with remaining staples well-approximated -- No abdominal masses appreciated, pulsatile or otherwise   Musculoskeletal / Integumentary:  -- Wounds or skin discoloration: None appreciated except as described above (GI)  -- Extremities: B/L UE and LE FROM, hands and feet warm, no edema  Neurologic:  -- Motor function: intact and symmetric  -- Sensation: intact and symmetric   Assessment:  52 y.o. yo Female with a problem list including...  Patient Active Problem List   Diagnosis Date Noted  . 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 Ileostomy in place [facility]) 09/12/2017  . Pressure injury of skin 07/02/2017  . Acute kidney injury ([place]) 07/01/2017  . Large intestine anastomotic leak 06/09/2017  . Difficult intubation   . Diverticulitis of intestine with abscess 05/03/2017  . Tobacco abuse 05/02/2017  . Hyperthyroidism   . Difficult airway   . Diverticulitis of large intestine with abscess   . Hypocalcemia 03/02/2017  . 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 S/P total thyroidectomy 01/28/2017  . 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 Abnormal TSH 08/04/2016  . Encounter for antineoplastic chemotherapy and immunotherapy 07/13/2016  . Encounter for long-term (current) use of high-risk medication 06/23/2016  . 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 Malignant melanoma of lower leg, left ([place]) 12/03/2015  . History of stroke 12/12/2013  . Rheumatoid arthritis involving multiple sites with positive rheumatoid factor ([place]) 12/12/2013  . CVA (cerebral infarction) 07/23/2012  . Hypertension 07/23/2012    presents to clinic for post-op follow-up wound evaluation, doing overall well s/p reversal of diverting ileostomy for post-surgical colonic anastomotic leak.  Plan:   - maintain hydration, high-fiber heart-healthy diet  - okay to shower with water and soapy water, pat incision dry  - no heavy lifting >40  lbs x 2 - 4 more weeks or as per operating surgeon  - return to clinic once more next week for follow-up with operating surgeon, Dr. Adonis Huguenin  - instructed to call  office if any questions or concerns  All of the above recommendations were discussed with the patient and patient's family, and all of patient's and family's questions were answered to their expressed satisfaction.  -- Marilynne Drivers Rosana Hoes, MD, [place]: [facility] - Partnering for exceptional care. Office: [phone]

## 2017-09-29 NOTE — Patient Instructions (Signed)
Please see your follow up appointment listed below.  °

## 2017-10-06 ENCOUNTER — Encounter: Payer: Self-pay | Admitting: General Surgery

## 2017-10-06 ENCOUNTER — Telehealth: Payer: Self-pay

## 2017-10-06 ENCOUNTER — Ambulatory Visit (INDEPENDENT_AMBULATORY_CARE_PROVIDER_SITE_OTHER): Payer: 59 | Admitting: General Surgery

## 2017-10-06 VITALS — BP 163/99 | HR 97 | Temp 98.3°F | Ht 64.0 in | Wt 152.2 lb

## 2017-10-06 DIAGNOSIS — Z4889 Encounter for other specified surgical aftercare: Secondary | ICD-10-CM

## 2017-10-06 NOTE — Patient Instructions (Signed)
Please call our office if you have questions or concerns.   

## 2017-10-06 NOTE — Telephone Encounter (Signed)
Faxed letter to return to work to [employer] at this time. [phone].

## 2017-10-06 NOTE — Progress Notes (Signed)
Outpatient Surgical Follow Up  10/06/2017  Rebecca Obrien is an 52 y.o. female.   Chief Complaint  Patient presents with  . Routine Post Op    Loop Ileostomy takedown 09/15/17 Dr.Anastacia Reinecke    HPI: 52 year old female returns to clinic for follow-up 3 weeks status post loop ileostomy takedown.  Patient reports doing very well.  She is eating well and having bowel function.  She denies any abdominal pain.  The prior ostomy site has healed well and the majority of the Steri-Strips have already fallen off.  She denies any fevers, chills, nausea, vomiting, chest pain, shortness of breath, diarrhea, constipation.  She states that she is felt from a GI standpoint in a very long time.  Past Medical History:  Diagnosis Date  . Anxiety   . Arthritis    RA  . Cancer ([place])    skin melanoma  . Collagen vascular disease (HCC)    RA  . Complication of anesthesia    difficult intubation  . Depression   . Difficult airway   . 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 Diverticulitis of large intestine with abscess   . Diverticulitis of sigmoid colon   . 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 History of kidney stones 1994   x 1  . Hyperlipidemia   . Hypertension    history of  . Hyperthyroidism   . Hypokalemia   . Large intestine anastomotic leak 06/09/2017  . Obesity   . Rheumatoid arthritis(714.0)   . Stroke [facility]) 2013   no residual    Past Surgical History:  Procedure Laterality Date  . ABDOMINAL HYSTERECTOMY    . CARPAL TUNNEL RELEASE  Bilateral  . CHOLECYSTECTOMY    . CYSTOSCOPY WITH STENT PLACEMENT Bilateral 05/31/2017   Procedure: CYSTOSCOPY WITH STENT PLACEMENT- LIGHTED STENTS;  Surgeon: Hollice Espy, MD;  Location: [facility] ORS;  Service: Urology;  Laterality: Bilateral;  . EXCISION MELANOMA WITH SENTINEL LYMPH NODE BIOPSY Left 12/2016   Left calf with lymph nodes removed in left groin.  . ILEOSTOMY N/A 06/08/2017   Procedure: ILEOSTOMY;  Surgeon:  Vickie Epley, MD;  Location: [place] [facility];  Service: General;  Laterality: N/A;  . ILEOSTOMY CLOSURE N/A 09/15/2017   Procedure: LOOP ILEOSTOMY TAKEDOWN;  Surgeon: Clayburn Pert, MD;  Location: [facility] ORS;  Service: General;  Laterality: N/A;  . LAPAROSCOPIC SIGMOID COLECTOMY N/A 05/31/2017   Procedure: LAPAROSCOPIC SIGMOID COLECTOMY;  Surgeon: Clayburn Pert, MD;  Location: [facility] ORS;  Service: General;  Laterality: N/A;  . LAPAROTOMY N/A 06/08/2017   Procedure: EXPLORATORY LAPAROTOMY;  Surgeon: Vickie Epley, MD;  Location: [facility] ORS;  Service: General;  Laterality: N/A;  . TEE WITHOUT CARDIOVERSION  07/26/2012   Procedure: TRANSESOPHAGEAL ECHOCARDIOGRAM (TEE);  Surgeon: Thayer Headings, MD;  Location: [place];  Service: Cardiovascular;  Laterality: N/A;  . TONSILLECTOMY    . TOTAL THYROIDECTOMY      Family History  Problem Relation Age of Onset  . Nephrolithiasis Mother   . Hypertension Mother   . Hypertension Father   . Heart failure Father   . Cardiomyopathy Brother     Social History:  reports that she has been smoking cigarettes.  She has a 2.30 pack-year smoking history. she has never used smokeless tobacco. She reports that she does not drink alcohol or use drugs.  Allergies:  Allergies  Allergen Reactions  . Tape Dermatitis and Other (See Comments)    Skin breakdown/dermatitis with prolonged use  Medications reviewed.    ROS A multipoint review of systems was completed, all pertinent positives and negatives are documented within the HPI and the remainder are negative   BP (!) 163/99   Pulse 97   Temp 98.3 F (36.8 C) (Oral)   Ht 5\' 4"  (1.626 m)   Wt 69 kg (152 lb 3.2 oz)   BMI 26.13 kg/m   Physical Exam General: No acute distress Chest: Clear to auscultation Heart: Rate and rhythm Abdomen: Soft, nontender, nondistended.  Well-healed prior incision sites without any evidence of erythema or drainage.    No results found for this or any  previous visit (from the past 48 hour(s)). No results found.  Assessment/Plan:  1. Aftercare following surgery 52 year old female status post loop ileostomy takedown.  Doing very well.  Provided with standard postoperative precautions.  She will follow-up in clinic on an as-needed basis.     Clayburn Pert, MD FACS General Surgeon  10/06/2017,10:28 AM

## 2017-10-17 DIAGNOSIS — C439 Malignant melanoma of skin, unspecified: Secondary | ICD-10-CM | POA: Diagnosis not present

## 2017-10-17 DIAGNOSIS — C4372 Malignant melanoma of left lower limb, including hip: Secondary | ICD-10-CM | POA: Diagnosis not present

## 2017-10-17 DIAGNOSIS — Z23 Encounter for immunization: Secondary | ICD-10-CM | POA: Diagnosis not present

## 2017-10-17 DIAGNOSIS — C44799 Other specified malignant neoplasm of skin of left lower limb, including hip: Secondary | ICD-10-CM | POA: Diagnosis not present

## 2017-10-17 DIAGNOSIS — Z9289 Personal history of other medical treatment: Secondary | ICD-10-CM | POA: Diagnosis not present

## 2017-10-17 DIAGNOSIS — M0579 Rheumatoid arthritis with rheumatoid factor of multiple sites without organ or systems involvement: Secondary | ICD-10-CM | POA: Diagnosis not present

## 2017-10-17 DIAGNOSIS — C779 Secondary and unspecified malignant neoplasm of lymph node, unspecified: Secondary | ICD-10-CM | POA: Diagnosis not present

## 2017-10-17 DIAGNOSIS — R918 Other nonspecific abnormal finding of lung field: Secondary | ICD-10-CM | POA: Diagnosis not present

## 2017-10-17 DIAGNOSIS — Z79899 Other long term (current) drug therapy: Secondary | ICD-10-CM | POA: Diagnosis not present

## 2017-10-17 DIAGNOSIS — Z5111 Encounter for antineoplastic chemotherapy: Secondary | ICD-10-CM | POA: Diagnosis not present

## 2017-10-17 DIAGNOSIS — Z5112 Encounter for antineoplastic immunotherapy: Secondary | ICD-10-CM | POA: Diagnosis not present

## 2017-12-26 DIAGNOSIS — C4372 Malignant melanoma of left lower limb, including hip: Secondary | ICD-10-CM | POA: Diagnosis not present

## 2017-12-26 DIAGNOSIS — Z79899 Other long term (current) drug therapy: Secondary | ICD-10-CM | POA: Diagnosis not present

## 2017-12-26 DIAGNOSIS — M0579 Rheumatoid arthritis with rheumatoid factor of multiple sites without organ or systems involvement: Secondary | ICD-10-CM | POA: Diagnosis not present

## 2018-01-16 DIAGNOSIS — Z9289 Personal history of other medical treatment: Secondary | ICD-10-CM | POA: Diagnosis not present

## 2018-01-16 DIAGNOSIS — E039 Hypothyroidism, unspecified: Secondary | ICD-10-CM | POA: Diagnosis not present

## 2018-01-16 DIAGNOSIS — C439 Malignant melanoma of skin, unspecified: Secondary | ICD-10-CM | POA: Diagnosis not present

## 2018-01-16 DIAGNOSIS — M0579 Rheumatoid arthritis with rheumatoid factor of multiple sites without organ or systems involvement: Secondary | ICD-10-CM | POA: Diagnosis not present

## 2018-01-16 DIAGNOSIS — C779 Secondary and unspecified malignant neoplasm of lymph node, unspecified: Secondary | ICD-10-CM | POA: Diagnosis not present

## 2018-01-16 DIAGNOSIS — C4372 Malignant melanoma of left lower limb, including hip: Secondary | ICD-10-CM | POA: Diagnosis not present

## 2018-01-28 ENCOUNTER — Emergency Department: Payer: 59

## 2018-01-28 ENCOUNTER — Encounter: Payer: Self-pay | Admitting: Emergency Medicine

## 2018-01-28 ENCOUNTER — Other Ambulatory Visit: Payer: Self-pay

## 2018-01-28 ENCOUNTER — Emergency Department
Admission: EM | Admit: 2018-01-28 | Discharge: 2018-01-28 | Disposition: A | Discharge status: Home / Self Care | Payer: 59 | Attending: Emergency Medicine | Admitting: Emergency Medicine

## 2018-01-28 DIAGNOSIS — F17210 Nicotine dependence, cigarettes, uncomplicated: Secondary | ICD-10-CM | POA: Diagnosis not present

## 2018-01-28 DIAGNOSIS — Z79899 Other long term (current) drug therapy: Secondary | ICD-10-CM | POA: Insufficient documentation

## 2018-01-28 DIAGNOSIS — R609 Edema, unspecified: Secondary | ICD-10-CM | POA: Insufficient documentation

## 2018-01-28 DIAGNOSIS — M069 Rheumatoid arthritis, unspecified: Secondary | ICD-10-CM | POA: Diagnosis not present

## 2018-01-28 DIAGNOSIS — R600 Localized edema: Secondary | ICD-10-CM | POA: Diagnosis not present

## 2018-01-28 DIAGNOSIS — M7989 Other specified soft tissue disorders: Secondary | ICD-10-CM | POA: Diagnosis not present

## 2018-01-28 DIAGNOSIS — R2242 Localized swelling, mass and lump, left lower limb: Secondary | ICD-10-CM | POA: Diagnosis present

## 2018-01-28 DIAGNOSIS — Z85820 Personal history of malignant melanoma of skin: Secondary | ICD-10-CM | POA: Insufficient documentation

## 2018-01-28 DIAGNOSIS — I10 Essential (primary) hypertension: Secondary | ICD-10-CM | POA: Diagnosis not present

## 2018-01-28 DIAGNOSIS — Z8673 Personal history of transient ischemic attack (TIA), and cerebral infarction without residual deficits: Secondary | ICD-10-CM | POA: Diagnosis not present

## 2018-01-28 LAB — BASIC METABOLIC PANEL
ANION GAP: 9 (ref 5–15)
BUN: 24 mg/dL — ABNORMAL HIGH (ref 6–20)
CALCIUM: 8.7 mg/dL — AB (ref 8.9–10.3)
CO2: 24 mmol/L (ref 22–32)
Chloride: 107 mmol/L (ref 101–111)
Creatinine, Ser: 0.87 mg/dL (ref 0.44–1.00)
GFR calc Af Amer: >60 mL/min (ref >60)
GLUCOSE: 94 mg/dL (ref 65–99)
Potassium: 3.6 mmol/L (ref 3.5–5.1)
SODIUM: 140 mmol/L (ref 135–145)

## 2018-01-28 LAB — CBC WITH DIFFERENTIAL/PLATELET
BASOS ABS: 0 10*3/uL (ref 0–0.1)
Basophils Relative: 0 %
EOS ABS: 0 10*3/uL (ref 0–0.7)
EOS PCT: 0 %
HCT: 37.2 % (ref 35.0–47.0)
Hemoglobin: 12.3 g/dL (ref 12.0–16.0)
LYMPHS PCT: 13 %
Lymphs Abs: 1.1 10*3/uL (ref 1.0–3.6)
MCH: 27.7 pg (ref 26.0–34.0)
MCHC: 33 g/dL (ref 32.0–36.0)
MCV: 84 fL (ref 80.0–100.0)
Monocytes Absolute: 0.4 10*3/uL (ref 0.2–0.9)
Monocytes Relative: 4 %
Neutro Abs: 6.8 10*3/uL — ABNORMAL HIGH (ref 1.4–6.5)
Neutrophils Relative %: 83 %
PLATELETS: 330 10*3/uL (ref 150–440)
RBC: 4.43 MIL/uL (ref 3.80–5.20)
RDW: 15.5 % — AB (ref 11.5–14.5)
WBC: 8.3 10*3/uL (ref 3.6–11.0)

## 2018-01-28 NOTE — Discharge Instructions (Addendum)
These were a compression stockings at all time.  There is no evidence of a DVT.  Elevate your legs tonight.  Please follow closely with primary care doctor, and if you have increased swelling, chest pain, shortness of breath, or other new or worrisome concerns return to the emergency department.  Follow closely with primary care doctor as well as cardiology as a precaution.

## 2018-01-28 NOTE — ED Notes (Signed)
Patient is complaining of weeping edema from her left leg.  Patient states this began after she got off an 8 hour shift of work this morning.  Patient has history of leg edema post lymph node removal due to cancer.  Patient is in no obvious distress at this time.

## 2018-01-28 NOTE — ED Provider Notes (Signed)
[facility] Emergency Department Provider Note  ____________________________________________   I have reviewed the triage vital signs and the nursing notes. Where available I have reviewed prior notes and, if possible and indicated, outside hospital notes.    HISTORY  Chief Complaint Leg Swelling    HPI Rebecca Obrien is a 52 y.o. female with a history of melanoma, who had a dissection and removal of lymph nodes in her left legs remotely.  Subsequent this she has had bilateral leg swelling but mostly in her left leg.  She works walking and standing up and is supposed to be wearing compression stockings but does not.  She is to also be on HCTZ but they took her off of that medication because it kept dropping her potassium.  Patient states she is been walking for the last for 5 days and not wearing her compression stockings now both legs are little bit swollen.  She has no chest pain or shortness of breath.  The left leg is always more swollen than the right leg because of left leg is warm weather to the lymph nodes out.  She has no known history of CHF and she has no exertional symptoms.  She denies chest pain.  She states that she called her physician because she saw a very scant amount of "weeping" from the left lower extremity which is not happened to her before she became concerned.  She denies any fever or redness, no trauma to the area, just a little bit of clear discharge has been noted.  That began yesterday.  Nothing makes better nothing makes it worse no other alleviating or aggravating factors, no prior treatment.     Past Medical History:  Diagnosis Date  . Anxiety   . Arthritis    RA  . Cancer ([place])    skin melanoma  . Collagen vascular disease (HCC)    RA  . Complication of anesthesia    difficult intubation  . Depression   . Difficult airway   . 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 Diverticulitis of  large intestine with abscess   . Diverticulitis of sigmoid colon   . 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 History of kidney stones 1994   x 1  . Hyperlipidemia   . Hypertension    history of  . Hyperthyroidism   . Hypokalemia   . Large intestine anastomotic leak 06/09/2017  . Obesity   . Rheumatoid arthritis(714.0)   . Stroke [facility]) 2013   no residual    Patient Active Problem List   Diagnosis Date Noted  . Pressure injury of skin 07/02/2017  . Acute kidney injury ([place]) 07/01/2017  . Difficult intubation   . Tobacco abuse 05/02/2017  . Hyperthyroidism   . Difficult airway   . Hypocalcemia 03/02/2017  . 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 S/P total thyroidectomy 01/28/2017  . 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 Abnormal TSH 08/04/2016  . Encounter for antineoplastic chemotherapy and immunotherapy 07/13/2016  . Encounter for long-term (current) use of high-risk medication 06/23/2016  . 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 Malignant melanoma of lower leg, left ([place]) 12/03/2015  . History of stroke 12/12/2013  . Rheumatoid arthritis involving multiple sites with positive rheumatoid factor ([place]) 12/12/2013  . CVA (cerebral infarction) 07/23/2012  . Hypertension 07/23/2012    Past Surgical History:  Procedure Laterality Date  . ABDOMINAL HYSTERECTOMY    . CARPAL TUNNEL RELEASE  Bilateral  . CHOLECYSTECTOMY    . CYSTOSCOPY WITH STENT PLACEMENT Bilateral 05/31/2017   Procedure: CYSTOSCOPY WITH STENT PLACEMENT- LIGHTED STENTS;  Surgeon: Hollice Espy, MD;  Location: [facility] OR[facility];  Service: Urology;  Laterality: Bilateral;  . EXCISION MELANOMA WITH SENTINEL LYMPH NODE BIOPSY Left 12/2016   Left calf with lymph nodes removed in left groin.  . ILEOSTOMY N/A 06/08/2017   Procedure: ILEOSTOMY;  Surgeon: Vickie Epley, MD;  Location: [facility];  Service: General;  Laterality: N/A;  . ILEOSTOMY CLOSURE N/A 09/15/2017   Procedure: LOOP ILEOSTOMY TAKEDOWN;   Surgeon: Clayburn Pert, MD;  Location: [facility];  Service: General;  Laterality: N/A;  . LAPAROSCOPIC SIGMOID COLECTOMY N/A 05/31/2017   Procedure: LAPAROSCOPIC SIGMOID COLECTOMY;  Surgeon: Clayburn Pert, MD;  Location: [facility];  Service: General;  Laterality: N/A;  . LAPAROTOMY N/A 06/08/2017   Procedure: EXPLORATORY LAPAROTOMY;  Surgeon: Vickie Epley, MD;  Location: [facility];  Service: General;  Laterality: N/A;  . TEE WITHOUT CARDIOVERSION  07/26/2012   Procedure: TRANSESOPHAGEAL ECHOCARDIOGRAM (TEE);  Surgeon: Thayer Headings, MD;  Location: [facility];  Service: Cardiovascular;  Laterality: N/A;  . TONSILLECTOMY    . TOTAL THYROIDECTOMY      Prior to Admission medications   Medication Sig Start Date End Date Taking? Authorizing Provider  levothyroxine (SYNTHROID, LEVOTHROID) 112 MCG tablet Take 112 mcg by mouth daily before breakfast.    [provider]  LORazepam (ATIVAN) 0.5 MG tablet Take 1 tablet (0.5 mg total) by mouth 2 (two) times daily as needed for anxiety. Patient taking differently: Take 0.5 mg by mouth daily. May take an additional 0.5 mg dose as needed for anxiety 07/06/17   Bettey Costa, MD    Allergies Tape  Family History  Problem Relation Age of Onset  . Nephrolithiasis Mother   . Hypertension Mother   . Hypertension Father   . Heart failure Father   . Cardiomyopathy Brother     Social History Social History   Tobacco Use  . Smoking status: Current Every Day Smoker    Packs/day: 0.10    Years: 23.00    Pack years: 2.30    Types: Cigarettes    Last attempt to quit: 07/23/2012    Years since quitting: 5.5  . Smokeless tobacco: Never Used  Substance Use Topics  . Alcohol use: No  . Drug use: No    Review of Systems Constitutional: No fever/chills Eyes: No visual changes. ENT: No sore throat. No stiff neck no neck pain Cardiovascular: Denies chest pain. Respiratory: Denies shortness of breath. Gastrointestinal:   no vomiting.   No diarrhea.  No constipation. Genitourinary: Negative for dysuria. Musculoskeletal: + lower extremity swelling Skin: Negative for rash. Neurological: Negative for severe headaches, focal weakness or numbness.   ____________________________________________   PHYSICAL EXAM:  VITAL SIGNS: ED Triage Vitals  Enc Vitals Group     BP 01/28/18 1749 (!) 173/110     Pulse Rate 01/28/18 1749 95     Resp 01/28/18 1749 18     Temp 01/28/18 1749 99.1 F (37.3 C)     Temp Source 01/28/18 1749 Oral     SpO2 01/28/18 1749 97 %     Weight 01/28/18 1750 168 lb (76.2 kg)     Height 01/28/18 1750 5\' 4"  (1.626 m)     Head Circumference --      Peak Flow --      Pain Score 01/28/18 1757 7     Pain Loc --  Pain Edu? --      Excl. in [place]? --     Constitutional: Alert and oriented. Well appearing and in no acute distress. Eyes: Conjunctivae are normal Head: Atraumatic HEENT: No congestion/rhinnorhea. Mucous membranes are moist.  Oropharynx non-erythematous Neck:   Nontender with no meningismus, no masses, no stridor Cardiovascular: Normal rate, regular rhythm. Grossly normal heart sounds.  Good peripheral circulation. Respiratory: Normal respiratory effort.  No retractions. Lungs CTAB. Abdominal: Soft and nontender. No distention. No guarding no rebound Back:  There is no focal tenderness or step off.  there is no midline tenderness there are no lesions noted. there is no CVA tenderness Musculoskeletal: No lower extremity tenderness, no upper extremity tenderness. No joint effusions, no DVT signs strong distal pulses which are symmetric bilaterally, good cap refill, compartments are soft, there is bilateral edema, on the right it is scant may be 1+, left is about 2+.  There is some clear weeping about 1/8 of a cc, noted in the anterior shin.  Only from one spot.  No cellulitic changes or abscess noted.  Not warm or hot to touch. Neurologic:  Normal speech and language. No gross focal neurologic  deficits are appreciated.  Skin:  Skin is warm, dry and intact. No rash noted. Psychiatric: Mood and affect are normal. Speech and behavior are normal.  ____________________________________________   LABS (all labs ordered are listed, but only abnormal results are displayed)  Labs Reviewed  CBC WITH DIFFERENTIAL/PLATELET - Abnormal; Notable for the following components:      Result Value   RDW 15.5 (*)    Neutro Abs 6.8 (*)    All other components within normal limits  BASIC METABOLIC PANEL - Abnormal; Notable for the following components:   BUN 24 (*)    Calcium 8.7 (*)    All other components within normal limits    Pertinent labs  results that were available during my care of the patient were reviewed by me and considered in my medical decision making (see chart for details). ____________________________________________  EKG  I personally interpreted any EKGs ordered by me or triage  ____________________________________________  RADIOLOGY  Pertinent labs & imaging results that were available during my care of the patient were reviewed by me and considered in my medical decision making (see chart for details). If possible, patient and/or family made aware of any abnormal findings.  No results found. ____________________________________________    PROCEDURES  Procedure(s) performed: None  Procedures  Critical Care performed: None  ____________________________________________   INITIAL IMPRESSION / ASSESSMENT AND PLAN / ED COURSE  Pertinent labs & imaging results that were available during my care of the patient were reviewed by me and considered in my medical decision making (see chart for details).  Patient here with chronic lower extremity swelling after a removal of her lymph nodes presents with venous stasis extrusion of fluid.  Patient in no acute distress.  No evidence of decapitated CHF or PE, low suspicion for DVT but we did send her for a Doppler of the  lower extremity.  She has no known history of CHF, given that there is some bilateral slight swelling, on the right included, she likely benefit with a follow-up with cardiologist to have an echo but I do not think that has to happen as an inpatient.  I have strongly advised that she should wear compression stockings as she is supposed to after her lymph node removal and she states that she will start doing so.  ____________________________________________   FINAL CLINICAL IMPRESSION(S) / ED DIAGNOSES  Final diagnoses:  None      This chart was dictated using voice recognition software.  Despite best efforts to proofread,  errors can occur which can change meaning.      Schuyler Amor, MD 01/28/18 Drema [place]on

## 2018-01-28 NOTE — ED Triage Notes (Signed)
Pt states that she her left leg is swelling and weeping. Pt states that her legs swell when she has been working but she has never had weeping before. Pt states that she has had lymph nodes in the left leg removed in the past. No redness noted. Pt is in NAD .

## 2018-02-06 DIAGNOSIS — F54 Psychological and behavioral factors associated with disorders or diseases classified elsewhere: Secondary | ICD-10-CM | POA: Diagnosis not present

## 2018-02-06 DIAGNOSIS — F17200 Nicotine dependence, unspecified, uncomplicated: Secondary | ICD-10-CM | POA: Diagnosis not present

## 2018-02-06 DIAGNOSIS — Z1389 Encounter for screening for other disorder: Secondary | ICD-10-CM | POA: Diagnosis not present

## 2018-02-06 DIAGNOSIS — C439 Malignant melanoma of skin, unspecified: Secondary | ICD-10-CM | POA: Diagnosis not present

## 2018-02-06 DIAGNOSIS — I10 Essential (primary) hypertension: Secondary | ICD-10-CM | POA: Diagnosis not present

## 2018-02-06 DIAGNOSIS — Z1159 Encounter for screening for other viral diseases: Secondary | ICD-10-CM | POA: Diagnosis not present

## 2018-02-09 ENCOUNTER — Inpatient Hospital Stay
Admission: EM | Admit: 2018-02-09 | Discharge: 2018-02-10 | DRG: 286 | Disposition: A | Discharge status: Home / Self Care | Payer: 59 | Attending: Internal Medicine | Admitting: Internal Medicine

## 2018-02-09 ENCOUNTER — Inpatient Hospital Stay
Admit: 2018-02-09 | Discharge: 2018-02-09 | Disposition: A | Discharge status: Home / Self Care | Payer: 59 | Attending: Cardiovascular Disease | Admitting: Cardiovascular Disease

## 2018-02-09 ENCOUNTER — Emergency Department: Payer: 59

## 2018-02-09 ENCOUNTER — Encounter: Payer: Self-pay | Admitting: Emergency Medicine

## 2018-02-09 ENCOUNTER — Other Ambulatory Visit: Payer: Self-pay

## 2018-02-09 DIAGNOSIS — E785 Hyperlipidemia, unspecified: Secondary | ICD-10-CM | POA: Diagnosis present

## 2018-02-09 DIAGNOSIS — I200 Unstable angina: Secondary | ICD-10-CM | POA: Diagnosis present

## 2018-02-09 DIAGNOSIS — E0590 Thyrotoxicosis, unspecified without thyrotoxic crisis or storm: Secondary | ICD-10-CM | POA: Diagnosis present

## 2018-02-09 DIAGNOSIS — Z791 Long term (current) use of non-steroidal anti-inflammatories (NSAID): Secondary | ICD-10-CM | POA: Diagnosis not present

## 2018-02-09 DIAGNOSIS — E669 Obesity, unspecified: Secondary | ICD-10-CM | POA: Diagnosis present

## 2018-02-09 DIAGNOSIS — Z85820 Personal history of malignant melanoma of skin: Secondary | ICD-10-CM | POA: Diagnosis not present

## 2018-02-09 DIAGNOSIS — Z91048 Other nonmedicinal substance allergy status: Secondary | ICD-10-CM

## 2018-02-09 DIAGNOSIS — I429 Cardiomyopathy, unspecified: Secondary | ICD-10-CM | POA: Diagnosis present

## 2018-02-09 DIAGNOSIS — Z7952 Long term (current) use of systemic steroids: Secondary | ICD-10-CM

## 2018-02-09 DIAGNOSIS — R748 Abnormal levels of other serum enzymes: Secondary | ICD-10-CM | POA: Diagnosis not present

## 2018-02-09 DIAGNOSIS — Z79890 Hormone replacement therapy (postmenopausal): Secondary | ICD-10-CM

## 2018-02-09 DIAGNOSIS — R0789 Other chest pain: Secondary | ICD-10-CM | POA: Diagnosis not present

## 2018-02-09 DIAGNOSIS — I110 Hypertensive heart disease with heart failure: Secondary | ICD-10-CM | POA: Diagnosis present

## 2018-02-09 DIAGNOSIS — M7989 Other specified soft tissue disorders: Secondary | ICD-10-CM

## 2018-02-09 DIAGNOSIS — I214 Non-ST elevation (NSTEMI) myocardial infarction: Secondary | ICD-10-CM | POA: Diagnosis not present

## 2018-02-09 DIAGNOSIS — R778 Other specified abnormalities of plasma proteins: Secondary | ICD-10-CM

## 2018-02-09 DIAGNOSIS — M069 Rheumatoid arthritis, unspecified: Secondary | ICD-10-CM | POA: Diagnosis present

## 2018-02-09 DIAGNOSIS — R079 Chest pain, unspecified: Secondary | ICD-10-CM

## 2018-02-09 DIAGNOSIS — Z90710 Acquired absence of both cervix and uterus: Secondary | ICD-10-CM | POA: Diagnosis not present

## 2018-02-09 DIAGNOSIS — Z9049 Acquired absence of other specified parts of digestive tract: Secondary | ICD-10-CM

## 2018-02-09 DIAGNOSIS — Z8249 Family history of ischemic heart disease and other diseases of the circulatory system: Secondary | ICD-10-CM | POA: Diagnosis not present

## 2018-02-09 DIAGNOSIS — I248 Other forms of acute ischemic heart disease: Secondary | ICD-10-CM | POA: Diagnosis present

## 2018-02-09 DIAGNOSIS — R0602 Shortness of breath: Secondary | ICD-10-CM

## 2018-02-09 DIAGNOSIS — I509 Heart failure, unspecified: Secondary | ICD-10-CM | POA: Diagnosis not present

## 2018-02-09 DIAGNOSIS — Z8673 Personal history of transient ischemic attack (TIA), and cerebral infarction without residual deficits: Secondary | ICD-10-CM | POA: Diagnosis not present

## 2018-02-09 DIAGNOSIS — F329 Major depressive disorder, single episode, unspecified: Secondary | ICD-10-CM | POA: Diagnosis present

## 2018-02-09 DIAGNOSIS — F419 Anxiety disorder, unspecified: Secondary | ICD-10-CM | POA: Diagnosis present

## 2018-02-09 DIAGNOSIS — M1990 Unspecified osteoarthritis, unspecified site: Secondary | ICD-10-CM | POA: Diagnosis present

## 2018-02-09 DIAGNOSIS — F17210 Nicotine dependence, cigarettes, uncomplicated: Secondary | ICD-10-CM | POA: Diagnosis present

## 2018-02-09 DIAGNOSIS — Z79899 Other long term (current) drug therapy: Secondary | ICD-10-CM | POA: Diagnosis not present

## 2018-02-09 DIAGNOSIS — I5022 Chronic systolic (congestive) heart failure: Secondary | ICD-10-CM

## 2018-02-09 DIAGNOSIS — I5021 Acute systolic (congestive) heart failure: Secondary | ICD-10-CM | POA: Diagnosis present

## 2018-02-09 DIAGNOSIS — I340 Nonrheumatic mitral (valve) insufficiency: Secondary | ICD-10-CM | POA: Diagnosis present

## 2018-02-09 DIAGNOSIS — I10 Essential (primary) hypertension: Secondary | ICD-10-CM | POA: Diagnosis not present

## 2018-02-09 DIAGNOSIS — Z6829 Body mass index (BMI) 29.0-29.9, adult: Secondary | ICD-10-CM

## 2018-02-09 DIAGNOSIS — R0989 Other specified symptoms and signs involving the circulatory and respiratory systems: Secondary | ICD-10-CM

## 2018-02-09 DIAGNOSIS — Z87442 Personal history of urinary calculi: Secondary | ICD-10-CM

## 2018-02-09 DIAGNOSIS — R600 Localized edema: Secondary | ICD-10-CM | POA: Diagnosis not present

## 2018-02-09 DIAGNOSIS — R7989 Other specified abnormal findings of blood chemistry: Secondary | ICD-10-CM

## 2018-02-09 DIAGNOSIS — R609 Edema, unspecified: Secondary | ICD-10-CM

## 2018-02-09 HISTORY — DX: Heart failure, unspecified: I50.9

## 2018-02-09 LAB — ECHOCARDIOGRAM COMPLETE
HEIGHTINCHES: 65 in
Weight: 2812.8 oz

## 2018-02-09 LAB — BASIC METABOLIC PANEL
Anion gap: 8 (ref 5–15)
BUN: 24 mg/dL — ABNORMAL HIGH (ref 6–20)
CHLORIDE: 110 mmol/L (ref 101–111)
CO2: 24 mmol/L (ref 22–32)
Calcium: 8.2 mg/dL — ABNORMAL LOW (ref 8.9–10.3)
Creatinine, Ser: 0.84 mg/dL (ref 0.44–1.00)
GFR calc non Af Amer: >60 mL/min (ref >60)
Glucose, Bld: 108 mg/dL — ABNORMAL HIGH (ref 65–99)
POTASSIUM: 3.3 mmol/L — AB (ref 3.5–5.1)
SODIUM: 142 mmol/L (ref 135–145)

## 2018-02-09 LAB — PROTIME-INR
INR: 1.05
Prothrombin Time: 13.6 seconds (ref 11.4–15.2)

## 2018-02-09 LAB — CBC
HEMATOCRIT: 38.5 % (ref 35.0–47.0)
Hemoglobin: 12.4 g/dL (ref 12.0–16.0)
MCH: 26.9 pg (ref 26.0–34.0)
MCHC: 32.2 g/dL (ref 32.0–36.0)
MCV: 83.7 fL (ref 80.0–100.0)
Platelets: 251 10*3/uL (ref 150–440)
RBC: 4.6 MIL/uL (ref 3.80–5.20)
RDW: 16.3 % — ABNORMAL HIGH (ref 11.5–14.5)
WBC: 7.1 10*3/uL (ref 3.6–11.0)

## 2018-02-09 LAB — TROPONIN I
TROPONIN I: 0.05 ng/mL — AB (ref <0.03)
Troponin I: 0.05 ng/mL (ref <0.03)
Troponin I: 0.05 ng/mL (ref <0.03)

## 2018-02-09 LAB — MAGNESIUM: Magnesium: 2 mg/dL (ref 1.7–2.4)

## 2018-02-09 LAB — APTT: aPTT: 29 seconds (ref 24–36)

## 2018-02-09 LAB — BRAIN NATRIURETIC PEPTIDE: B Natriuretic Peptide: 795 pg/mL — ABNORMAL HIGH (ref 0.0–100.0)

## 2018-02-09 LAB — HEPARIN LEVEL (UNFRACTIONATED): Heparin Unfractionated: 0.25 IU/mL — ABNORMAL LOW (ref 0.30–0.70)

## 2018-02-09 MED ORDER — CLOPIDOGREL BISULFATE 75 MG PO TABS: 75.0000 mg | ORAL_TABLET | Freq: Every day | ORAL | Status: DC

## 2018-02-09 MED ORDER — METOPROLOL SUCCINATE ER 25 MG PO TB24
12.5000 mg | ORAL_TABLET | Freq: Every day | ORAL | Status: DC
  Administered 2018-02-09: 12.5 mg via ORAL
  Filled 2018-02-09: qty 1

## 2018-02-09 MED ORDER — HEPARIN BOLUS VIA INFUSION
4000.0000 [IU] | Freq: Once | INTRAVENOUS | Status: AC
  Administered 2018-02-09: 4000 [IU] via INTRAVENOUS
  Filled 2018-02-09: qty 4000

## 2018-02-09 MED ORDER — ENOXAPARIN SODIUM 40 MG/0.4ML ~~LOC~~ SOLN: 40.0000 mg | SUBCUTANEOUS | Status: DC

## 2018-02-09 MED ORDER — POTASSIUM CHLORIDE 20 MEQ PO PACK
40.0000 meq | PACK | Freq: Once | ORAL | Status: AC
  Administered 2018-02-09: 40 meq via ORAL
  Filled 2018-02-09: qty 2

## 2018-02-09 MED ORDER — POLYETHYLENE GLYCOL 3350 17 G PO PACK: 17.0000 g | PACK | Freq: Every day | ORAL | Status: DC | PRN

## 2018-02-09 MED ORDER — ASPIRIN EC 81 MG PO TBEC
81.0000 mg | DELAYED_RELEASE_TABLET | Freq: Every day | ORAL | Status: DC
  Filled 2018-02-09: qty 1

## 2018-02-09 MED ORDER — HEPARIN (PORCINE) IN NACL 100-0.45 UNIT/ML-% IJ SOLN
1050.0000 [IU]/h | INTRAMUSCULAR | Status: DC
  Administered 2018-02-09 – 2018-02-10 (×2): 1050 [IU]/h via INTRAVENOUS
  Filled 2018-02-09: qty 250

## 2018-02-09 MED ORDER — ALPRAZOLAM 0.5 MG PO TABS
0.2500 mg | ORAL_TABLET | Freq: Once | ORAL | Status: AC
  Administered 2018-02-09: 0.25 mg via ORAL
  Filled 2018-02-09: qty 1

## 2018-02-09 MED ORDER — HEPARIN BOLUS VIA INFUSION
1100.0000 [IU] | Freq: Once | INTRAVENOUS | Status: AC
  Administered 2018-02-09: 1100 [IU] via INTRAVENOUS
  Filled 2018-02-09: qty 1100

## 2018-02-09 MED ORDER — FUROSEMIDE 10 MG/ML IJ SOLN
20.0000 mg | Freq: Once | INTRAMUSCULAR | Status: AC
  Administered 2018-02-09: 20 mg via INTRAVENOUS
  Filled 2018-02-09: qty 4

## 2018-02-09 MED ORDER — SERTRALINE HCL 50 MG PO TABS
25.0000 mg | ORAL_TABLET | Freq: Every day | ORAL | Status: DC
  Administered 2018-02-09: 25 mg via ORAL
  Filled 2018-02-09: qty 1

## 2018-02-09 MED ORDER — CLOPIDOGREL BISULFATE 75 MG PO TABS
300.0000 mg | ORAL_TABLET | Freq: Once | ORAL | Status: AC
  Administered 2018-02-09: 300 mg via ORAL
  Filled 2018-02-09: qty 4

## 2018-02-09 MED ORDER — LORAZEPAM 0.5 MG PO TABS
0.5000 mg | ORAL_TABLET | Freq: Four times a day (QID) | ORAL | Status: DC | PRN
Start: 2018-02-09 — End: 2018-02-10
  Administered 2018-02-09 – 2018-02-10 (×2): 0.5 mg via ORAL
  Filled 2018-02-09 (×2): qty 1

## 2018-02-09 MED ORDER — ISOSORBIDE MONONITRATE ER 30 MG PO TB24
30.0000 mg | ORAL_TABLET | Freq: Every day | ORAL | Status: DC
  Administered 2018-02-09: 30 mg via ORAL
  Filled 2018-02-09: qty 1

## 2018-02-09 MED ORDER — LORAZEPAM 0.5 MG PO TABS
0.5000 mg | ORAL_TABLET | Freq: Two times a day (BID) | ORAL | Status: DC | PRN
  Administered 2018-02-09: 0.5 mg via ORAL
  Filled 2018-02-09 (×2): qty 1

## 2018-02-09 MED ORDER — SERTRALINE HCL 50 MG PO TABS: 50.0000 mg | ORAL_TABLET | Freq: Every day | ORAL | Status: DC

## 2018-02-09 MED ORDER — ACETAMINOPHEN 650 MG RE SUPP: 650.0000 mg | Freq: Four times a day (QID) | RECTAL | Status: DC | PRN

## 2018-02-09 MED ORDER — PREDNISONE 10 MG PO TABS
5.0000 mg | ORAL_TABLET | Freq: Every day | ORAL | Status: DC
  Administered 2018-02-10: 5 mg via ORAL
  Filled 2018-02-09: qty 1

## 2018-02-09 MED ORDER — FUROSEMIDE 10 MG/ML IJ SOLN
20.0000 mg | Freq: Three times a day (TID) | INTRAMUSCULAR | Status: DC
  Administered 2018-02-09 (×2): 20 mg via INTRAVENOUS
  Filled 2018-02-09 (×2): qty 2

## 2018-02-09 MED ORDER — LISINOPRIL 5 MG PO TABS
2.5000 mg | ORAL_TABLET | Freq: Every day | ORAL | Status: DC
  Administered 2018-02-09: 2.5 mg via ORAL
  Filled 2018-02-09: qty 1

## 2018-02-09 MED ORDER — ALBUTEROL SULFATE (2.5 MG/3ML) 0.083% IN NEBU
2.5000 mg | INHALATION_SOLUTION | RESPIRATORY_TRACT | Status: DC | PRN
  Administered 2018-02-09: 2.5 mg via RESPIRATORY_TRACT
  Filled 2018-02-09: qty 3

## 2018-02-09 MED ORDER — LISINOPRIL 20 MG PO TABS
20.0000 mg | ORAL_TABLET | Freq: Every day | ORAL | Status: DC
  Administered 2018-02-09: 20 mg via ORAL
  Filled 2018-02-09: qty 1

## 2018-02-09 MED ORDER — ACETAMINOPHEN 325 MG PO TABS
650.0000 mg | ORAL_TABLET | Freq: Four times a day (QID) | ORAL | Status: DC | PRN
Start: 2018-02-09 — End: 2018-02-10

## 2018-02-09 MED ORDER — ASPIRIN 81 MG PO CHEW
324.0000 mg | CHEWABLE_TABLET | Freq: Once | ORAL | Status: AC
  Administered 2018-02-09: 324 mg via ORAL
  Filled 2018-02-09: qty 4

## 2018-02-09 MED ORDER — LEVOTHYROXINE SODIUM 112 MCG PO TABS
112.0000 ug | ORAL_TABLET | Freq: Every day | ORAL | Status: DC
  Administered 2018-02-10: 112 ug via ORAL
  Filled 2018-02-09: qty 1

## 2018-02-09 MED ORDER — LEFLUNOMIDE 20 MG PO TABS
20.0000 mg | ORAL_TABLET | Freq: Every day | ORAL | Status: DC
  Filled 2018-02-09: qty 1

## 2018-02-09 MED ORDER — ONDANSETRON HCL 4 MG/2ML IJ SOLN: 4.0000 mg | Freq: Four times a day (QID) | INTRAMUSCULAR | Status: DC | PRN

## 2018-02-09 MED ORDER — NITROGLYCERIN 2 % TD OINT
1.0000 [in_us] | TOPICAL_OINTMENT | Freq: Once | TRANSDERMAL | Status: AC
Start: 2018-02-09 — End: 2018-02-09
  Administered 2018-02-09: 1 [in_us] via TOPICAL
  Filled 2018-02-09: qty 1

## 2018-02-09 MED ORDER — HEPARIN (PORCINE) IN NACL 100-0.45 UNIT/ML-% IJ SOLN
900.0000 [IU]/h | INTRAMUSCULAR | Status: DC
Start: 2018-02-09 — End: 2018-02-09
  Administered 2018-02-09: 900 [IU]/h via INTRAVENOUS
  Filled 2018-02-09: qty 250

## 2018-02-09 MED ORDER — ONDANSETRON HCL 4 MG PO TABS: 4.0000 mg | ORAL_TABLET | Freq: Four times a day (QID) | ORAL | Status: DC | PRN

## 2018-02-09 NOTE — H&P (Signed)
[place] [facility] [place] NAME: Rebecca Obrien    MR#:  [id number]  DATE OF BIRTH:  1966/01/22  DATE OF ADMISSION:  02/09/2018  PRIMARY CARE PHYSICIAN: Marguerita Merles, MD   REQUESTING/REFERRING PHYSICIAN: Dr sung  CHIEF COMPLAINT:   Shortness of breath and chest tightness HISTORY OF PRESENT ILLNESS:  Rebecca Obrien  is a 52 y.o. female with a known history of CHf, anxiety, RA, tobacco abuse HTN and HL comes to the emergency room with increasing shortness of breath and chest pressure. Patient was found to be in congestive heart failure. Her troponin was .05. She does not have a history of MI in the past. Patient was given Lasix.  PAST MEDICAL HISTORY:   Past Medical History:  Diagnosis Date  . Anxiety   . Arthritis    RA  . Cancer ([place])    skin melanoma  . CHF (congestive heart failure) ([place]) 02/09/2018  . Collagen vascular disease (HCC)    RA  . Complication of anesthesia    difficult intubation  . Depression   . Difficult airway   . 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 Diverticulitis of large intestine with abscess   . Diverticulitis of sigmoid colon   . 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 History of kidney stones 1994   x 1  . Hyperlipidemia   . Hypertension    history of  . Hyperthyroidism   . Hypokalemia   . Large intestine anastomotic leak 06/09/2017  . Obesity   . Rheumatoid arthritis(714.0)   . Stroke [facility]) 2013   no residual    PAST SURGICAL HISTOIRY:   Past Surgical History:  Procedure Laterality Date  . ABDOMINAL HYSTERECTOMY    . CARPAL TUNNEL RELEASE  Bilateral  . CHOLECYSTECTOMY    . CYSTOSCOPY WITH STENT PLACEMENT Bilateral 05/31/2017   Procedure: CYSTOSCOPY WITH STENT PLACEMENT- LIGHTED STENTS;  Surgeon: Hollice Espy, MD;  Location: [facility];  Service: Urology;  Laterality: Bilateral;  . EXCISION MELANOMA WITH SENTINEL LYMPH NODE BIOPSY Left 12/2016   Left calf  with lymph nodes removed in left groin.  . ILEOSTOMY N/A 06/08/2017   Procedure: ILEOSTOMY;  Surgeon: Vickie Epley, MD;  Location: [facility];  Service: General;  Laterality: N/A;  . ILEOSTOMY CLOSURE N/A 09/15/2017   Procedure: LOOP ILEOSTOMY TAKEDOWN;  Surgeon: Clayburn Pert, MD;  Location: [facility];  Service: General;  Laterality: N/A;  . LAPAROSCOPIC SIGMOID COLECTOMY N/A 05/31/2017   Procedure: LAPAROSCOPIC SIGMOID COLECTOMY;  Surgeon: Clayburn Pert, MD;  Location: [facility];  Service: General;  Laterality: N/A;  . LAPAROTOMY N/A 06/08/2017   Procedure: EXPLORATORY LAPAROTOMY;  Surgeon: Vickie Epley, MD;  Location: [facility];  Service: General;  Laterality: N/A;  . TEE WITHOUT CARDIOVERSION  07/26/2012   Procedure: TRANSESOPHAGEAL ECHOCARDIOGRAM (TEE);  Surgeon: Thayer Headings, MD;  Location: [place];  Service: Cardiovascular;  Laterality: N/A;  . TONSILLECTOMY    . TOTAL THYROIDECTOMY      SOCIAL HISTORY:   Social History   Tobacco Use  . Smoking status: Current Every Day Smoker    Packs/day: 0.10    Years: 23.00    Pack years: 2.30    Types: Cigarettes    Last attempt to quit: 07/23/2012    Years since quitting: 5.5  . Smokeless tobacco: Never Used  Substance Use Topics  . Alcohol use: No    FAMILY HISTORY:   Family  History  Problem Relation Age of Onset  . Nephrolithiasis Mother   . Hypertension Mother   . Hypertension Father   . Heart failure Father   . Cardiomyopathy Brother     DRUG ALLERGIES:   Allergies  Allergen Reactions  . Tape Dermatitis and Other (See Comments)    Skin breakdown/dermatitis with prolonged use     REVIEW OF SYSTEMS:  Review of Systems  Constitutional: Negative for chills, fever and weight loss.  HENT: Negative for ear discharge, ear pain and nosebleeds.   Eyes: Negative for blurred vision, pain and discharge.  Respiratory: Positive for shortness of breath. Negative for sputum production, wheezing and stridor.    Cardiovascular: Negative for chest pain, palpitations, orthopnea and PND.  Gastrointestinal: Negative for abdominal pain, diarrhea, nausea and vomiting.  Genitourinary: Negative for frequency and urgency.  Musculoskeletal: Negative for back pain and joint pain.  Neurological: Negative for sensory change, speech change, focal weakness and weakness.  Psychiatric/Behavioral: Negative for depression and hallucinations. The patient is not nervous/anxious.      MEDICATIONS AT HOME:   Prior to Admission medications   Medication Sig Start Date End Date Taking? Authorizing Provider  leflunomide (ARAVA) 20 MG tablet Take 20 mg by mouth daily. 11/09/17  Yes [provider]  levothyroxine (SYNTHROID, LEVOTHROID) 112 MCG tablet Take 112 mcg by mouth daily before breakfast.   Yes [provider]  lisinopril (PRINIVIL,ZESTRIL) 20 MG tablet Take 20 mg by mouth daily. 12/16/17  Yes [provider]  LORazepam (ATIVAN) 0.5 MG tablet Take 1 tablet (0.5 mg total) by mouth 2 (two) times daily as needed for anxiety. Patient taking differently: Take 0.5 mg by mouth daily. May take an additional 0.5 mg dose as needed for anxiety 07/06/17  Yes Mody, Sital, MD  meloxicam (MOBIC) 15 MG tablet Take 15 mg by mouth daily.   Yes [provider]  predniSONE (DELTASONE) 5 MG tablet Take 5 mg by mouth daily.   Yes [provider]  sertraline (ZOLOFT) 25 MG tablet Take 1 tablet by mouth once daily for one week, then 2 tablets daily 02/06/18  Yes [provider]      VITAL SIGNS:  Blood pressure (!) 150/93, pulse 86, temperature (!) 97.5 F (36.4 C), temperature source Oral, resp. rate 20, height 5\' 5"  (1.651 m), weight 79.7 kg (175 lb 12.8 oz), SpO2 100 %.  PHYSICAL EXAMINATION:  GENERAL:  52 y.o.-year-old patient lying in the bed with no acute distress.  EYES: Pupils equal, round, reactive to light and accommodation. No scleral icterus. Extraocular muscles intact.   HEENT: Head atraumatic, normocephalic. Oropharynx and nasopharynx clear.  NECK:  Supple, no jugular venous distention. No thyroid enlargement, no tenderness.  LUNGS: Normal breath sounds bilaterally, no wheezing, rales,rhonchi or crepitation. No use of accessory muscles of respiration.  CARDIOVASCULAR: S1, S2 normal. No murmurs, rubs, or gallops.  ABDOMEN: Soft, nontender, nondistended. Bowel sounds present. No organomegaly or mass.  EXTREMITIES: No pedal edema, cyanosis, or clubbing.  NEUROLOGIC: Cranial nerves II through XII are intact. Muscle strength 5/5 in all extremities. Sensation intact. Gait not checked.  PSYCHIATRIC: The patient is alert and oriented x 3.  SKIN: No obvious rash, lesion, or ulcer.   LABORATORY PANEL:   CBC Recent Labs  Lab 02/09/18 0600  WBC 7.1  HGB 12.4  HCT 38.5  PLT 251   ------------------------------------------------------------------------------------------------------------------  Chemistries  Recent Labs  Lab 02/09/18 0600  NA 142  K 3.3*  CL 110  CO2 24  GLUCOSE 108*  BUN 24*  CREATININE 0.84  CALCIUM 8.2*  MG 2.0   ------------------------------------------------------------------------------------------------------------------  Cardiac Enzymes Recent Labs  Lab 02/09/18 1209  TROPONINI 0.05*   ------------------------------------------------------------------------------------------------------------------  RADIOLOGY:  Dg Chest 2 View  Result Date: 02/09/2018 CLINICAL DATA:  Acute onset of shortness of breath and bilateral leg swelling. Upper chest heaviness. EXAM: CHEST - 2 VIEW COMPARISON:  Chest radiograph performed 07/23/2012 FINDINGS: The lungs are well-aerated. Mild vascular congestion is noted. Increased interstitial markings may reflect mild interstitial edema. A small right pleural effusion is noted. There is no evidence of pneumothorax. The heart is borderline normal in size. No acute osseous abnormalities are seen.  IMPRESSION: Mild vascular congestion noted. Increased interstitial markings may reflect mild interstitial edema. Small right pleural effusion noted. Electronically Signed   By: Garald Balding M.D.   On: 02/09/2018 06:12   US Venous Img Lower Bilateral  Result Date: 02/09/2018 CLINICAL DATA:  Bilateral lower extremity swelling left greater than right, shortness of Breath EXAM: BILATERAL LOWER EXTREMITY VENOUS DOPPLER ULTRASOUND TECHNIQUE: Gray-scale sonography with graded compression, as well as color Doppler and duplex ultrasound were performed to evaluate the lower extremity deep venous systems from the level of the common femoral vein and including the common femoral, femoral, profunda femoral, popliteal and calf veins including the posterior tibial, peroneal and gastrocnemius veins when visible. The superficial great saphenous vein was also interrogated. Spectral Doppler was utilized to evaluate flow at rest and with distal augmentation maneuvers in the common femoral, femoral and popliteal veins. COMPARISON:  01/28/2018 FINDINGS: RIGHT LOWER EXTREMITY Common Femoral Vein: No evidence of thrombus. Normal compressibility, respiratory phasicity and response to augmentation. Saphenofemoral Junction: No evidence of thrombus. Normal compressibility and flow on color Doppler imaging. Profunda Femoral Vein: No evidence of thrombus. Normal compressibility and flow on color Doppler imaging. Femoral Vein: No evidence of thrombus. Normal compressibility, respiratory phasicity and response to augmentation. Popliteal Vein: No evidence of thrombus. Normal compressibility, respiratory phasicity and response to augmentation. Calf Veins: No evidence of thrombus. Normal compressibility and flow on color Doppler imaging. Superficial Great Saphenous Vein: No evidence of thrombus. Normal compressibility. Venous Reflux:  None. Other Findings:  None. LEFT LOWER EXTREMITY Common Femoral Vein: No evidence of thrombus. Normal  compressibility, respiratory phasicity and response to augmentation. Saphenofemoral Junction: No evidence of thrombus. Normal compressibility and flow on color Doppler imaging. Profunda Femoral Vein: No evidence of thrombus. Normal compressibility and flow on color Doppler imaging. Femoral Vein: No evidence of thrombus. Normal compressibility, respiratory phasicity and response to augmentation. Popliteal Vein: No evidence of thrombus. Normal compressibility, respiratory phasicity and response to augmentation. Calf Veins: No evidence of thrombus. Normal compressibility and flow on color Doppler imaging. Superficial Great Saphenous Vein: No evidence of thrombus. Normal compressibility. Venous Reflux:  None. Other Findings:  None. IMPRESSION: No evidence of deep venous thrombosis bilaterally. Electronically Signed   By: Inez Catalina M.D.   On: 02/09/2018 08:33    EKG:  normal sinus rhythm. RAD  IMPRESSION AND PLAN:  Rebecca Obrien is a 52 y.o. female who returns to the ED from home with a chief complaint of bilateral leg swelling.  Patient has a history of melanoma status post lymph node resection of her left lower extremity.  Has chronic swelling to that extremity  *Acute systolic congestive heart failure new onset  -patient presented with increasing shortness of breath with elevated BNP chest x-ray consistent with pulmonary vascular congestion -Lasix, beta blockers and ACE inhibitor -was started on  IV heparin drip by Dr. Yancey Flemings cardiology. Will DC it.  *Elevated troponin in the setting of CHF appears demand ischemia. -The heparin drip discontinued -plans for left heart catheterization noted. -Echo shows possibility of severe MR.  *Anxiety continue Ativan PRN  *Hypertension continue home meds  *History of melanoma     All the records are reviewed and case discussed with ED provider. Management plans discussed with the patient, family and they are in agreement.  CODE STATUS: full  TOTAL  TIME TAKING CARE OF THIS PATIENT:40* minutes.    Fritzi Mandes M.D on 02/09/2018 at 4:12 PM  Between 7am to 6pm - Pager - [phone]  After 6pm go to [email or web address] - password [email or web address] [facility]  [employer]  Office  [phone]  CC: Primary care physician; Marguerita Merles, MD

## 2018-02-09 NOTE — Consult Note (Signed)
ANTICOAGULATION CONSULT NOTE - Initial Consult  Pharmacy Consult for Heparin Drip  Indication: chest pain/ACS  Allergies  Allergen Reactions  . Tape Dermatitis and Other (See Comments)    Skin breakdown/dermatitis with prolonged use     Patient Measurements: Height: 5\' 5"  (165.1 cm) Weight: 175 lb 12.8 oz (79.7 kg) IBW/kg (Calculated) : 57 Heparin Dosing Weight: 74kg  Vital Signs: Temp: 97.7 F (36.5 C) (06/06 1641) Temp Source: Oral (06/06 1641) BP: 155/101 (06/06 1641) Pulse Rate: 75 (06/06 1641)  Labs: Recent Labs    02/09/18 0600 02/09/18 1209 02/09/18 1611  HGB 12.4  --   --   HCT 38.5  --   --   PLT 251  --   --   APTT 29  --   --   LABPROT 13.6  --   --   INR 1.05  --   --   HEPARINUNFRC  --   --  0.25*  CREATININE 0.84  --   --   TROPONINI 0.05* 0.05*  --     Estimated Creatinine Clearance: 80.8 mL/min (by C-G formula based on SCr of 0.84 mg/dL).   Medical History: Past Medical History:  Diagnosis Date  . Anxiety   . Arthritis    RA  . Cancer ([place])    skin melanoma  . CHF (congestive heart failure) ([place]) 02/09/2018  . Collagen vascular disease (HCC)    RA  . Complication of anesthesia    difficult intubation  . Depression   . Difficult airway   . 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 Diverticulitis of large intestine with abscess   . Diverticulitis of sigmoid colon   . 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 History of kidney stones 1994   x 1  . Hyperlipidemia   . Hypertension    history of  . Hyperthyroidism   . Hypokalemia   . Large intestine anastomotic leak 06/09/2017  . Obesity   . Rheumatoid arthritis(714.0)   . Stroke [facility]) 2013   no residual    Assessment: Pharmacy consulted for heparin dosing and monitoring in 68 female for ACS/NSTEMI.   Goal of Therapy:  Heparin level 0.3-0.7 units/ml Monitor platelets by anticoagulation protocol: Yes   Plan:  Baseline labs ordered.  Heparin dosing weight  is 74kg.  Give 1100 units bolus x 1 increase heparin infusion to 1050 units/hr Check anti-Xa level in 6 hours and daily while on heparin Continue to monitor H&H and platelets  Dallie Piles, PharmD Clinical Pharmacist 02/09/2018 5:28 PM

## 2018-02-09 NOTE — ED Triage Notes (Addendum)
Patient ambulatory to triage with steady gait, without difficulty or distress noted; pt reports awoke this am with [facility] and swelling to legs; also reports upper chest heaviness

## 2018-02-09 NOTE — Progress Notes (Signed)
Spoke to [place] and due to Severe MR, may need MVR, and do cath am.

## 2018-02-09 NOTE — Consult Note (Signed)
ANTICOAGULATION CONSULT NOTE - Initial Consult  Pharmacy Consult for Heparin Drip  Indication: chest pain/ACS  Allergies  Allergen Reactions  . Tape Dermatitis and Other (See Comments)    Skin breakdown/dermatitis with prolonged use     Patient Measurements: Height: 5\' 5"  (165.1 cm) Weight: 176 lb (79.8 kg) IBW/kg (Calculated) : 57 Heparin Dosing Weight: 74kg  Vital Signs: Temp: 97.5 F (36.4 C) (06/06 0857) Temp Source: Oral (06/06 0857) BP: 161/106 (06/06 0857) Pulse Rate: 100 (06/06 0857)  Labs: Recent Labs    02/09/18 0600  HGB 12.4  HCT 38.5  PLT 251  CREATININE 0.84  TROPONINI 0.05*    Estimated Creatinine Clearance: 80.8 mL/min (by C-G formula based on SCr of 0.84 mg/dL).   Medical History: Past Medical History:  Diagnosis Date  . Anxiety   . Arthritis    RA  . Cancer ([place])    skin melanoma  . CHF (congestive heart failure) ([place]) 02/09/2018  . Collagen vascular disease (HCC)    RA  . Complication of anesthesia    difficult intubation  . Depression   . Difficult airway   . 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 Diverticulitis of large intestine with abscess   . Diverticulitis of sigmoid colon   . 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 History of kidney stones 1994   x 1  . Hyperlipidemia   . Hypertension    history of  . Hyperthyroidism   . Hypokalemia   . Large intestine anastomotic leak 06/09/2017  . Obesity   . Rheumatoid arthritis(714.0)   . Stroke [facility]) 2013   no residual    Assessment: Pharmacy consulted for heparin dosing and monitoring in 33 female for ACS/NSTEMI.   Goal of Therapy:  Heparin level 0.3-0.7 units/ml Monitor platelets by anticoagulation protocol: Yes   Plan:  Baseline labs ordered.  Heparin dosing weight is 74kg.  Give 4000 units bolus x 1 Start heparin infusion at 900 units/hr Check anti-Xa level in 6 hours and daily while on heparin Continue to monitor H&H and platelets  Pernell Dupre, PharmD, BCPS Clinical Pharmacist 02/09/2018 9:16 AM

## 2018-02-09 NOTE — Consult Note (Signed)
PHARMACY CONSULT NOTE - INITIAL   Pharmacy Consult for Electrolyte Monitoring and Replacement   Patient Measurements: Height: 5\' 5"  (165.1 cm) Weight: 176 lb (79.8 kg) IBW/kg (Calculated) : 57 Vital Signs: Temp: 97.5 F (36.4 C) (06/06 0857) Temp Source: Oral (06/06 0857) BP: 161/106 (06/06 0857) Pulse Rate: 100 (06/06 0857) Intake/Output from previous day:  Intake/Output from this shift: No intake/output data recorded.  Labs: Recent Labs    02/09/18 0600  WBC 7.1  HGB 12.4  HCT 38.5  PLT 251  CREATININE 0.84  MG 2.0   Potassium (mmol/L)  Date Value  02/09/2018 3.3 (L)  07/23/2012 3.3 (L)   Magnesium (mg/dL)  Date Value  02/09/2018 2.0  07/23/2012 1.9  ] Estimated Creatinine Clearance: 80.8 mL/min (by C-G formula based on SCr of 0.84 mg/dL).   Assessment: Pharmacy consulted for electrolyte monitoring and replacement in 52yo female admitted with hypokalemia. Patient does have Furosemide 20mg  TID ordered.   Goal of Therapy:  Electrolytes WNL  Plan:  Will order KCL 82mEq x 2 since patient is receiving lasix TID. No additional replacement need at this time.  Will recheck electrolytes with AM labs and continue to replace electrolytes as needed.   Pernell Dupre, PharmD, BCPS Clinical Pharmacist 02/09/2018 9:20 AM

## 2018-02-09 NOTE — Progress Notes (Signed)
Rapid Response Event Note  Overview:Patient with SOB on exertion to bathroom. Hx of anxiety. Patient with diagnosis of CHF. Heart Cath planned for this admission.       Initial Focused Assessment: 178/119; 100%, 96 pulse, RR 26   Interventions: Nebulizer,EKG, Xanax, Hydralazine, O2 2LPM  Plan of Care (if not transferred): Monitor on telemetry.   Event Summary:   at  58 Responded to patient with increased RR and SOB s/p ambulation to the bathroom. Dr. Posey Pronto present for assessment of patient. AC and SWOT nurse at bedside.    at

## 2018-02-09 NOTE — Progress Notes (Signed)
Patient has severe MR with severe LV dysfunction , will do L and R heart cath.

## 2018-02-09 NOTE — Progress Notes (Signed)
*  PRELIMINARY RESULTS* Echocardiogram 2D Echocardiogram has been performed.  Rebecca Obrien 02/09/2018, 2:37 PM

## 2018-02-09 NOTE — ED Provider Notes (Signed)
[facility] Emergency Department Provider Note   ____________________________________________   First MD Initiated Contact with Patient 02/09/18 [phone]     (approximate)  I have reviewed the triage vital signs and the nursing notes.   HISTORY  Chief Complaint Shortness of Breath and Leg Swelling    HPI Rebecca Obrien is a 52 y.o. female who returns to the ED from home with a chief complaint of bilateral leg swelling.  Patient has a history of melanoma status post lymph node resection of her left lower extremity.  Has chronic swelling to that extremity.  She was seen in the ED 1 week ago for same; had negative Doppler ultrasound and referred back to her PCP for follow-up.  Patient states she used to be on HCTZ but her PCP took her off for hypokalemia.  States now both legs are swollen and she is experiencing chest tightness and shortness of breath especially when laying flat.  Denies recent fever, chills, cough, abdominal pain, nausea, vomiting, dysuria, diarrhea.  Denies recent travel or trauma.  She had not been wearing compression stockings as she should but states she has been wearing them since her last visit 1 week ago.  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   Past Medical History:  Diagnosis Date  . Anxiety   . Arthritis    RA  . Cancer ([place])    skin melanoma  . Collagen vascular disease (HCC)    RA  . Complication of anesthesia    difficult intubation  . Depression   . Difficult airway   . 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 Diverticulitis of large intestine with abscess   . Diverticulitis of sigmoid colon   . 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 History of kidney stones 1994   x 1  . Hyperlipidemia   . Hypertension    history of  . Hyperthyroidism   . Hypokalemia   . Large intestine anastomotic leak 06/09/2017  . Obesity   . Rheumatoid  arthritis(714.0)   . Stroke [facility]) 2013   no residual    Patient Active Problem List   Diagnosis Date Noted  . Pressure injury of skin 07/02/2017  . Acute kidney injury ([place]) 07/01/2017  . Difficult intubation   . Tobacco abuse 05/02/2017  . Hyperthyroidism   . Difficult airway   . Hypocalcemia 03/02/2017  . 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 S/P total thyroidectomy 01/28/2017  . 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 Abnormal TSH 08/04/2016  . Encounter for antineoplastic chemotherapy and immunotherapy 07/13/2016  . Encounter for long-term (current) use of high-risk medication 06/23/2016  . 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 Malignant melanoma of lower leg, left ([place]) 12/03/2015  . History of stroke 12/12/2013  . Rheumatoid arthritis involving multiple sites with positive rheumatoid factor ([place]) 12/12/2013  . CVA (cerebral infarction) 07/23/2012  . Hypertension 07/23/2012    Past Surgical History:  Procedure Laterality Date  . ABDOMINAL HYSTERECTOMY    . CARPAL TUNNEL RELEASE  Bilateral  . CHOLECYSTECTOMY    . CYSTOSCOPY WITH STENT PLACEMENT Bilateral 05/31/2017   Procedure: CYSTOSCOPY WITH STENT PLACEMENT- LIGHTED STENTS;  Surgeon: Hollice Espy, MD;  Location: [facility] ORS;  Service: Urology;  Laterality: Bilateral;  . EXCISION MELANOMA WITH SENTINEL LYMPH NODE BIOPSY Left 12/2016   Left calf with lymph nodes removed in left groin.  Marland Kitchen  ILEOSTOMY N/A 06/08/2017   Procedure: ILEOSTOMY;  Surgeon: Vickie Epley, MD;  Location: [place] [facility];  Service: General;  Laterality: N/A;  . ILEOSTOMY CLOSURE N/A 09/15/2017   Procedure: LOOP ILEOSTOMY TAKEDOWN;  Surgeon: Clayburn Pert, MD;  Location: [facility] ORS;  Service: General;  Laterality: N/A;  . LAPAROSCOPIC SIGMOID COLECTOMY N/A 05/31/2017   Procedure: LAPAROSCOPIC SIGMOID COLECTOMY;  Surgeon: Clayburn Pert, MD;  Location: [facility] ORS;  Service: General;  Laterality: N/A;  . LAPAROTOMY N/A  06/08/2017   Procedure: EXPLORATORY LAPAROTOMY;  Surgeon: Vickie Epley, MD;  Location: [facility] ORS;  Service: General;  Laterality: N/A;  . TEE WITHOUT CARDIOVERSION  07/26/2012   Procedure: TRANSESOPHAGEAL ECHOCARDIOGRAM (TEE);  Surgeon: Thayer Headings, MD;  Location: [place];  Service: Cardiovascular;  Laterality: N/A;  . TONSILLECTOMY    . TOTAL THYROIDECTOMY      Prior to Admission medications   Medication Sig Start Date End Date Taking? Authorizing Provider  levothyroxine (SYNTHROID, LEVOTHROID) 112 MCG tablet Take 112 mcg by mouth daily before breakfast.    [provider]  LORazepam (ATIVAN) 0.5 MG tablet Take 1 tablet (0.5 mg total) by mouth 2 (two) times daily as needed for anxiety. Patient taking differently: Take 0.5 mg by mouth daily. May take an additional 0.5 mg dose as needed for anxiety 07/06/17   Bettey Costa, MD    Allergies Tape  Family History  Problem Relation Age of Onset  . Nephrolithiasis Mother   . Hypertension Mother   . Hypertension Father   . Heart failure Father   . Cardiomyopathy Brother     Social History Social History   Tobacco Use  . Smoking status: Current Every Day Smoker    Packs/day: 0.10    Years: 23.00    Pack years: 2.30    Types: Cigarettes    Last attempt to quit: 07/23/2012    Years since quitting: 5.5  . Smokeless tobacco: Never Used  Substance Use Topics  . Alcohol use: No  . Drug use: No    Review of Systems  Constitutional: No fever/chills Eyes: No visual changes. ENT: No sore throat. Cardiovascular: Positive for chest pain. Respiratory: Positive for shortness of breath. Gastrointestinal: No abdominal pain.  No nausea, no vomiting.  No diarrhea.  No constipation. Genitourinary: Negative for dysuria. Musculoskeletal: Positive for BLE swelling.  Negative for back pain. Skin: Negative for rash. Neurological: Negative for headaches, focal weakness or  numbness.   ____________________________________________   PHYSICAL EXAM:  VITAL SIGNS: ED Triage Vitals  Enc Vitals Group     BP 02/09/18 0550 (!) 160/107     Pulse Rate 02/09/18 0550 89     Resp 02/09/18 0550 20     Temp 02/09/18 0550 97.8 F (36.6 C)     Temp Source 02/09/18 0550 Oral     SpO2 02/09/18 0550 98 %     Weight 02/09/18 0538 176 lb (79.8 kg)     Height 02/09/18 0538 5\' 5"  (1.651 m)     Head Circumference --      Peak Flow --      Pain Score 02/09/18 0538 6     Pain Loc --      Pain Edu? --      Excl. in [place]? --     Constitutional: Alert and oriented. Well appearing and in mild acute distress. Eyes: Conjunctivae are normal. PERRL. EOMI. Head: Atraumatic. Nose: No congestion/rhinnorhea. Mouth/Throat: Mucous membranes are moist.  Oropharynx non-erythematous. Neck: No stridor.  Cardiovascular: Normal rate, regular rhythm. Grossly normal heart sounds.  Good peripheral circulation. Respiratory: Normal respiratory effort.  No retractions. Lungs with faint bibasilar rales. Gastrointestinal: Soft and nontender. No distention. No abdominal bruits. No CVA tenderness. Musculoskeletal: No lower extremity tenderness. 1+ BLE pitting edema.  No joint effusions. Neurologic:  Normal speech and language. No gross focal neurologic deficits are appreciated. No gait instability. Skin:  Skin is warm, dry and intact. No rash noted. Psychiatric: Mood and affect are normal. Speech and behavior are normal.  ____________________________________________   LABS (all labs ordered are listed, but only abnormal results are displayed)  Labs Reviewed  BASIC METABOLIC PANEL - Abnormal; Notable for the following components:      Result Value   Potassium 3.3 (*)    Glucose, Bld 108 (*)    BUN 24 (*)    Calcium 8.2 (*)    All other components within normal limits  CBC - Abnormal; Notable for the following components:   RDW 16.3 (*)    All other components within normal limits   TROPONIN I - Abnormal; Notable for the following components:   Troponin I 0.05 (*)    All other components within normal limits  BRAIN NATRIURETIC PEPTIDE   ____________________________________________  EKG  ED ECG REPORT I, Christon Gallaway J, the attending physician, personally viewed and interpreted this ECG.   Date: 02/09/2018  EKG Time: 0546  Rate: 93  Rhythm: normal EKG, normal sinus rhythm  Axis: RAD  Intervals:none  ST&T Change: Nonspecific  ____________________________________________  RADIOLOGY  ED MD interpretation: Pulmonary vascular congestion  Official radiology report(s): Dg Chest 2 View  Result Date: 02/09/2018 CLINICAL DATA:  Acute onset of shortness of breath and bilateral leg swelling. Upper chest heaviness. EXAM: CHEST - 2 VIEW COMPARISON:  Chest radiograph performed 07/23/2012 FINDINGS: The lungs are well-aerated. Mild vascular congestion is noted. Increased interstitial markings may reflect mild interstitial edema. A small right pleural effusion is noted. There is no evidence of pneumothorax. The heart is borderline normal in size. No acute osseous abnormalities are seen. IMPRESSION: Mild vascular congestion noted. Increased interstitial markings may reflect mild interstitial edema. Small right pleural effusion noted. Electronically Signed   By: Garald Balding M.D.   On: 02/09/2018 06:12    ____________________________________________   PROCEDURES  Procedure(s) performed: None  Procedures  Critical Care performed: No  ____________________________________________   INITIAL IMPRESSION / ASSESSMENT AND PLAN / ED COURSE  As part of my medical decision making, I reviewed the following data within the [place] notes reviewed and incorporated, Labs reviewed, EKG interpreted, Old chart reviewed, Radiograph reviewed and Notes from prior ED visits   52 year old female who presents with chest tightness and shortness of breath in the  setting of increased bilateral lower extremity edema. Differential includes, but is not limited to, viral syndrome, bronchitis including COPD exacerbation, pneumonia, reactive airway disease including asthma, CHF including exacerbation with or without pulmonary/interstitial edema, pneumothorax, ACS, thoracic trauma, and pulmonary embolism.  I personally reviewed patient's ED visit from 01/28/2018.  She had a negative work-up for decompensated CHF; negative Doppler ultrasound for DVT.  She was encouraged to wear her compression stockings and to follow-up with cardiology.  Will administer 20 mg IV Lasix.  Awaiting results of labs.  Chest x-ray concerning for pulmonary vascular congestion.  Will repeat Doppler ultrasound of bilateral lower extremities to evaluate for DVT.   Clinical Course as of Feb 10 711  Thu Feb 09, 2018  0712 Noted elevated  troponin.  No prior troponins for comparison.  Patient does not have a history of vascular congestion or CHF.  Will administer aspirin, nitroglycerin paste in addition to the Lasix patient already received.  Updated patient and discuss with hospitalist to evaluate patient in the emergency department for admission.   [JS]    Clinical Course User Index [JS] Paulette Blanch, MD     ____________________________________________   FINAL CLINICAL IMPRESSION(S) / ED DIAGNOSES  Final diagnoses:  Peripheral edema  Leg swelling  Pulmonary vascular congestion  Shortness of breath  Chest pain, unspecified type  Elevated troponin     ED Discharge Orders    None       Note:  This document was prepared using Dragon voice recognition software and may include unintentional dictation errors.    Paulette Blanch, MD 02/09/18 [phone]

## 2018-02-09 NOTE — Consult Note (Signed)
Rebecca Obrien is a 52 y.o. female  [id number]  Primary Cardiologist: Neoma Laming Reason for Consultation: chest tightness and CHF  HPI: this is a 52 year old white female with history of melanoma andhistory of swelling of the legs for the past couple of weeks because of limited lymphatic drainage. Patient presented to the hospital with tightness and heaviness in the chest associated with shortness of breath and swelling of the legs.   Review of Systems: patient is currently complaining of tightness about 2/10 in her left precordium   Past Medical History:  Diagnosis Date  . Anxiety   . Arthritis    RA  . Cancer ([place])    skin melanoma  . CHF (congestive heart failure) ([place]) 02/09/2018  . Collagen vascular disease (HCC)    RA  . Complication of anesthesia    difficult intubation  . Depression   . Difficult airway   . 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 Diverticulitis of large intestine with abscess   . Diverticulitis of sigmoid colon   . 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 History of kidney stones 1994   x 1  . Hyperlipidemia   . Hypertension    history of  . Hyperthyroidism   . Hypokalemia   . Large intestine anastomotic leak 06/09/2017  . Obesity   . Rheumatoid arthritis(714.0)   . Stroke [facility]) 2013   no residual    Medications Prior to Admission  Medication Sig Dispense Refill  . leflunomide (ARAVA) 20 MG tablet Take 20 mg by mouth daily.  1  . lisinopril (PRINIVIL,ZESTRIL) 20 MG tablet Take 20 mg by mouth daily.  11  . levothyroxine (SYNTHROID, LEVOTHROID) 112 MCG tablet Take 112 mcg by mouth daily before breakfast.    . LORazepam (ATIVAN) 0.5 MG tablet Take 1 tablet (0.5 mg total) by mouth 2 (two) times daily as needed for anxiety. (Patient taking differently: Take 0.5 mg by mouth daily. May take an additional 0.5 mg dose as needed for anxiety) 20 tablet 0  . sertraline (ZOLOFT) 25 MG tablet   0     . aspirin EC  81 mg  Oral Daily  . furosemide  20 mg Intravenous TID  . heparin  4,000 Units Intravenous Once  . [START ON 02/10/2018] levothyroxine  112 mcg Oral QAC breakfast  . lisinopril  2.5 mg Oral Daily  . metoprolol succinate  12.5 mg Oral Daily  . potassium chloride  40 mEq Oral Once  . potassium chloride  40 mEq Oral Once    Infusions: . heparin      Allergies  Allergen Reactions  . Tape Dermatitis and Other (See Comments)    Skin breakdown/dermatitis with prolonged use     Social History   Socioeconomic History  . Marital status: Married    Spouse name: Not on file  . Number of children: Not on file  . Years of education: Not on file  . Highest education level: Not on file  Occupational History  . Not on file  Social Needs  . Financial resource strain: Not on file  . Food insecurity:    Worry: Not on file    Inability: Not on file  . Transportation needs:    Medical: Not on file    Non-medical: Not on file  Tobacco Use  . Smoking status: Current Every Day Smoker    Packs/day: 0.10    Years: 23.00    Pack years:  2.30    Types: Cigarettes    Last attempt to quit: 07/23/2012    Years since quitting: 5.5  . Smokeless tobacco: Never Used  Substance and Sexual Activity  . Alcohol use: No  . Drug use: No  . Sexual activity: Not on file  Lifestyle  . Physical activity:    Days per week: Not on file    Minutes per session: Not on file  . Stress: Not on file  Relationships  . Social connections:    Talks on phone: Not on file    Gets together: Not on file    Attends religious service: Not on file    Active member of club or organization: Not on file    Attends meetings of clubs or organizations: Not on file    Relationship status: Not on file  . Intimate partner violence:    Fear of current or ex partner: Not on file    Emotionally abused: Not on file    Physically abused: Not on file    Forced sexual activity: Not on file  Other Topics Concern  . Not on file  Social  History Narrative  . Not on file    Family History  Problem Relation Age of Onset  . Nephrolithiasis Mother   . Hypertension Mother   . Hypertension Father   . Heart failure Father   . Cardiomyopathy Brother     PHYSICAL EXAM: Vitals:   02/09/18 0730 02/09/18 0857  BP: (!) 151/99 (!) 161/106  Pulse: 85 100  Resp: 20   Temp:  (!) 97.5 F (36.4 C)  SpO2: 95% 100%    No intake or output data in the 24 hours ending 02/09/18 0927  General:  Well appearing. No respiratory difficulty HEENT: normal Neck: supple. no JVD. Carotids 2+ bilat; no bruits. No lymphadenopathy or thryomegaly appreciated. Cor: PMI nondisplaced. Regular rate & rhythm. No rubs, gallops or murmurs. Lungs: clear Abdomen: soft, nontender, nondistended. No hepatosplenomegaly. No bruits or masses. Good bowel sounds. Extremities: no cyanosis, clubbing, rash, edema Neuro: alert & oriented x 3, cranial nerves grossly intact. moves all 4 extremities w/o difficulty. Affect pleasant.  ZOX:WRUEAV sinus rhythm nonspecific ST-T changes  Results for orders placed or performed during the hospital encounter of 02/09/18 (from the past 24 hour(s))  Basic metabolic panel     Status: Abnormal   Collection Time: 02/09/18  6:00 AM  Result Value Ref Range   Sodium 142 135 - 145 mmol/L   Potassium 3.3 (L) 3.5 - 5.1 mmol/L   Chloride 110 101 - 111 mmol/L   CO2 24 22 - 32 mmol/L   Glucose, Bld 108 (H) 65 - 99 mg/dL   BUN 24 (H) 6 - 20 mg/dL   Creatinine, Ser 0.84 0.44 - 1.00 mg/dL   Calcium 8.2 (L) 8.9 - 10.3 mg/dL   GFR calc non Af Amer >60 >60 mL/min   GFR calc Af Amer >60 >60 mL/min   Anion gap 8 5 - 15  CBC     Status: Abnormal   Collection Time: 02/09/18  6:00 AM  Result Value Ref Range   WBC 7.1 3.6 - 11.0 K/uL   RBC 4.60 3.80 - 5.20 MIL/uL   Hemoglobin 12.4 12.0 - 16.0 g/dL   HCT 38.5 35.0 - 47.0 %   MCV 83.7 80.0 - 100.0 fL   MCH 26.9 26.0 - 34.0 pg   MCHC 32.2 32.0 - 36.0 g/dL   RDW 16.3 (H) 11.5 - 14.5 %  Platelets 251 150 - 440 K/uL  Troponin I     Status: Abnormal   Collection Time: 02/09/18  6:00 AM  Result Value Ref Range   Troponin I 0.05 (HH) <0.03 ng/mL  Brain natriuretic peptide     Status: Abnormal   Collection Time: 02/09/18  6:00 AM  Result Value Ref Range   B Natriuretic Peptide 795.0 (H) 0.0 - 100.0 pg/mL  Magnesium     Status: None   Collection Time: 02/09/18  6:00 AM  Result Value Ref Range   Magnesium 2.0 1.7 - 2.4 mg/dL   Dg Chest 2 View  Result Date: 02/09/2018 CLINICAL DATA:  Acute onset of shortness of breath and bilateral leg swelling. Upper chest heaviness. EXAM: CHEST - 2 VIEW COMPARISON:  Chest radiograph performed 07/23/2012 FINDINGS: The lungs are well-aerated. Mild vascular congestion is noted. Increased interstitial markings may reflect mild interstitial edema. A small right pleural effusion is noted. There is no evidence of pneumothorax. The heart is borderline normal in size. No acute osseous abnormalities are seen. IMPRESSION: Mild vascular congestion noted. Increased interstitial markings may reflect mild interstitial edema. Small right pleural effusion noted. Electronically Signed   By: Garald Balding M.D.   On: 02/09/2018 06:12   US Venous Img Lower Bilateral  Result Date: 02/09/2018 CLINICAL DATA:  Bilateral lower extremity swelling left greater than right, shortness of Breath EXAM: BILATERAL LOWER EXTREMITY VENOUS DOPPLER ULTRASOUND TECHNIQUE: Gray-scale sonography with graded compression, as well as color Doppler and duplex ultrasound were performed to evaluate the lower extremity deep venous systems from the level of the common femoral vein and including the common femoral, femoral, profunda femoral, popliteal and calf veins including the posterior tibial, peroneal and gastrocnemius veins when visible. The superficial great saphenous vein was also interrogated. Spectral Doppler was utilized to evaluate flow at rest and with distal augmentation maneuvers in the  common femoral, femoral and popliteal veins. COMPARISON:  01/28/2018 FINDINGS: RIGHT LOWER EXTREMITY Common Femoral Vein: No evidence of thrombus. Normal compressibility, respiratory phasicity and response to augmentation. Saphenofemoral Junction: No evidence of thrombus. Normal compressibility and flow on color Doppler imaging. Profunda Femoral Vein: No evidence of thrombus. Normal compressibility and flow on color Doppler imaging. Femoral Vein: No evidence of thrombus. Normal compressibility, respiratory phasicity and response to augmentation. Popliteal Vein: No evidence of thrombus. Normal compressibility, respiratory phasicity and response to augmentation. Calf Veins: No evidence of thrombus. Normal compressibility and flow on color Doppler imaging. Superficial Great Saphenous Vein: No evidence of thrombus. Normal compressibility. Venous Reflux:  None. Other Findings:  None. LEFT LOWER EXTREMITY Common Femoral Vein: No evidence of thrombus. Normal compressibility, respiratory phasicity and response to augmentation. Saphenofemoral Junction: No evidence of thrombus. Normal compressibility and flow on color Doppler imaging. Profunda Femoral Vein: No evidence of thrombus. Normal compressibility and flow on color Doppler imaging. Femoral Vein: No evidence of thrombus. Normal compressibility, respiratory phasicity and response to augmentation. Popliteal Vein: No evidence of thrombus. Normal compressibility, respiratory phasicity and response to augmentation. Calf Veins: No evidence of thrombus. Normal compressibility and flow on color Doppler imaging. Superficial Great Saphenous Vein: No evidence of thrombus. Normal compressibility. Venous Reflux:  None. Other Findings:  None. IMPRESSION: No evidence of deep venous thrombosis bilaterally. Electronically Signed   By: Inez Catalina M.D.   On: 02/09/2018 08:33     ASSESSMENT AND PLAN: unstable angina with congestive heart failure and nonspecific ST-T changes and minor  elevation of elevated troponin. Will start the patient on heparin and  continue current medication and consider cardiac catheterization tomorrow if the elevated troponin and gets worse.  Kevon Tench A

## 2018-02-09 NOTE — Progress Notes (Signed)
Chaplains responded to RRT page to patient room.  Upon reaching the patient's room, medical staff were in process of working on/with the patient.  Chaplains maintained pastoral presence and waited outside the door with patient's father while awaiting update on patient's status.  Chaplains offered spiritual support to patient's father, including prayer for the patient and their family. Patient's father indicated that his daughter was exhibiting signs of anxiety, which seemed to have been subsequently exacerbated by the presence of "so many" hospital staff.  After prayer, patient's father was able to return to his daughter's room and chaplain staff left.    02/09/18 1520  Clinical Encounter Type  Visited With Family  Visit Type Initial  Referral From Care management  Spiritual Encounters  Spiritual Needs Prayer

## 2018-02-10 ENCOUNTER — Encounter: Payer: Self-pay | Admitting: Cardiovascular Disease

## 2018-02-10 ENCOUNTER — Encounter
Admission: EM | Disposition: A | Discharge status: Home / Self Care | Payer: Self-pay | Source: Home / Self Care | Attending: Internal Medicine

## 2018-02-10 HISTORY — PX: LEFT HEART CATH AND CORONARY ANGIOGRAPHY: CATH118249

## 2018-02-10 LAB — HEPARIN LEVEL (UNFRACTIONATED)
Heparin Unfractionated: 0.39 IU/mL (ref 0.30–0.70)
Heparin Unfractionated: 0.41 IU/mL (ref 0.30–0.70)

## 2018-02-10 LAB — CBC
HCT: 37.2 % (ref 35.0–47.0)
HEMOGLOBIN: 12.3 g/dL (ref 12.0–16.0)
MCH: 27.5 pg (ref 26.0–34.0)
MCHC: 32.9 g/dL (ref 32.0–36.0)
MCV: 83.3 fL (ref 80.0–100.0)
PLATELETS: 261 10*3/uL (ref 150–440)
RBC: 4.47 MIL/uL (ref 3.80–5.20)
RDW: 16 % — ABNORMAL HIGH (ref 11.5–14.5)
WBC: 7.7 10*3/uL (ref 3.6–11.0)

## 2018-02-10 LAB — BASIC METABOLIC PANEL
ANION GAP: 9 (ref 5–15)
BUN: 19 mg/dL (ref 6–20)
CHLORIDE: 103 mmol/L (ref 101–111)
CO2: 29 mmol/L (ref 22–32)
CREATININE: 0.98 mg/dL (ref 0.44–1.00)
Calcium: 8.5 mg/dL — ABNORMAL LOW (ref 8.9–10.3)
GFR calc non Af Amer: >60 mL/min (ref >60)
Glucose, Bld: 87 mg/dL (ref 65–99)
POTASSIUM: 3.5 mmol/L (ref 3.5–5.1)
SODIUM: 141 mmol/L (ref 135–145)

## 2018-02-10 LAB — TROPONIN I: TROPONIN I: 0.05 ng/mL — AB (ref <0.03)

## 2018-02-10 SURGERY — LEFT HEART CATH AND CORONARY ANGIOGRAPHY
Anesthesia: Moderate Sedation | Laterality: Right

## 2018-02-10 MED ORDER — FUROSEMIDE 20 MG PO TABS: 20.0000 mg | ORAL_TABLET | Freq: Every day | ORAL | Status: DC

## 2018-02-10 MED ORDER — SODIUM CHLORIDE 0.9 % IV SOLN: 250.0000 mL | INTRAVENOUS | Status: DC | PRN

## 2018-02-10 MED ORDER — SODIUM CHLORIDE 0.9% FLUSH: 3.0000 mL | INTRAVENOUS | Status: DC | PRN

## 2018-02-10 MED ORDER — ASPIRIN 81 MG PO CHEW
81.0000 mg | CHEWABLE_TABLET | ORAL | Status: AC
  Administered 2018-02-10: 81 mg via ORAL
  Filled 2018-02-10: qty 1

## 2018-02-10 MED ORDER — ASPIRIN 81 MG PO TBEC: 81.0000 mg | DELAYED_RELEASE_TABLET | Freq: Every day | ORAL | 1 refills | Status: AC

## 2018-02-10 MED ORDER — POTASSIUM CHLORIDE 20 MEQ PO PACK: 40.0000 meq | PACK | Freq: Once | ORAL | Status: DC

## 2018-02-10 MED ORDER — SODIUM CHLORIDE 0.9% FLUSH: 3.0000 mL | Freq: Two times a day (BID) | INTRAVENOUS | Status: DC

## 2018-02-10 MED ORDER — SPIRONOLACTONE 25 MG PO TABS: 12.5000 mg | ORAL_TABLET | Freq: Every day | ORAL | 1 refills | Status: DC

## 2018-02-10 MED ORDER — SODIUM CHLORIDE 0.9 % WEIGHT BASED INFUSION: 1.0000 mL/kg/h | INTRAVENOUS | Status: DC

## 2018-02-10 MED ORDER — SACUBITRIL-VALSARTAN 24-26 MG PO TABS: 1.0000 | ORAL_TABLET | Freq: Two times a day (BID) | ORAL | Status: DC

## 2018-02-10 MED ORDER — IOPAMIDOL (ISOVUE-300) INJECTION 61%
INTRAVENOUS | Status: DC | PRN
  Administered 2018-02-10: 150 mL via INTRAVENOUS

## 2018-02-10 MED ORDER — ACETAMINOPHEN 325 MG PO TABS: 650.0000 mg | ORAL_TABLET | ORAL | Status: DC | PRN

## 2018-02-10 MED ORDER — SPIRONOLACTONE 25 MG PO TABS
12.5000 mg | ORAL_TABLET | Freq: Every day | ORAL | Status: DC
  Filled 2018-02-10: qty 0.5

## 2018-02-10 MED ORDER — LIDOCAINE HCL (PF) 1 % IJ SOLN
INTRAMUSCULAR | Status: AC
  Filled 2018-02-10: qty 30

## 2018-02-10 MED ORDER — MIDAZOLAM HCL 2 MG/2ML IJ SOLN
INTRAMUSCULAR | Status: DC | PRN
  Administered 2018-02-10 (×2): 1 mg via INTRAVENOUS

## 2018-02-10 MED ORDER — CARVEDILOL 6.25 MG PO TABS: 6.2500 mg | ORAL_TABLET | Freq: Two times a day (BID) | ORAL | 1 refills | Status: DC

## 2018-02-10 MED ORDER — SODIUM CHLORIDE 0.9 % WEIGHT BASED INFUSION: 3.0000 mL/kg/h | INTRAVENOUS | Status: DC

## 2018-02-10 MED ORDER — FENTANYL CITRATE (PF) 100 MCG/2ML IJ SOLN
INTRAMUSCULAR | Status: DC | PRN
  Administered 2018-02-10 (×2): 25 ug via INTRAVENOUS

## 2018-02-10 MED ORDER — FENTANYL CITRATE (PF) 100 MCG/2ML IJ SOLN
INTRAMUSCULAR | Status: AC
  Filled 2018-02-10: qty 2

## 2018-02-10 MED ORDER — ASPIRIN 81 MG PO CHEW: 81.0000 mg | CHEWABLE_TABLET | ORAL | Status: DC

## 2018-02-10 MED ORDER — SACUBITRIL-VALSARTAN 24-26 MG PO TABS: 1.0000 | ORAL_TABLET | Freq: Two times a day (BID) | ORAL | 1 refills | Status: DC

## 2018-02-10 MED ORDER — CARVEDILOL 6.25 MG PO TABS
6.2500 mg | ORAL_TABLET | Freq: Two times a day (BID) | ORAL | Status: DC
  Filled 2018-02-10: qty 1

## 2018-02-10 MED ORDER — MIDAZOLAM HCL 2 MG/2ML IJ SOLN
INTRAMUSCULAR | Status: AC
  Filled 2018-02-10: qty 2

## 2018-02-10 MED ORDER — FUROSEMIDE 20 MG PO TABS: 20.0000 mg | ORAL_TABLET | Freq: Every day | ORAL | 1 refills | Status: DC

## 2018-02-10 MED ORDER — ONDANSETRON HCL 4 MG/2ML IJ SOLN: 4.0000 mg | Freq: Four times a day (QID) | INTRAMUSCULAR | Status: DC | PRN

## 2018-02-10 SURGICAL SUPPLY — 12 items
CATH INFINITI 5FR ANG PIGTAIL (CATHETERS) ×3 IMPLANT
CATH INFINITI 5FR JL4 (CATHETERS) ×3 IMPLANT
CATH INFINITI JR4 5F (CATHETERS) ×3 IMPLANT
CATH SWANZ 7F THERMO (CATHETERS) IMPLANT
DEVICE CLOSURE MYNXGRIP 5F (Vascular Products) ×3 IMPLANT
KIT MANI 3VAL PERCEP (MISCELLANEOUS) ×3 IMPLANT
KIT RIGHT HEART (MISCELLANEOUS) IMPLANT
NEEDLE PERC 18GX7CM (NEEDLE) ×3 IMPLANT
PACK CARDIAC CATH (CUSTOM PROCEDURE TRAY) ×3 IMPLANT
SHEATH AVANTI 5FR X 11CM (SHEATH) ×3 IMPLANT
SHEATH AVANTI 7FRX11 (SHEATH) IMPLANT
WIRE GUIDERIGHT .035X150 (WIRE) ×3 IMPLANT

## 2018-02-10 NOTE — Progress Notes (Signed)
RN removed IVs.  Patient will ride home in a private vehicle with her son and husband.  Phillis Knack, RN

## 2018-02-10 NOTE — Consult Note (Signed)
PHARMACY CONSULT NOTE - INITIAL   Pharmacy Consult for Electrolyte Monitoring and Replacement   Patient Measurements: Height: 5\' 5"  (165.1 cm) Weight: 163 lb 4.8 oz (74.1 kg) IBW/kg (Calculated) : 57 Vital Signs: Temp: 98.4 F (36.9 C) (06/07 1152) Temp Source: Oral (06/07 1152) BP: 139/101 (06/07 1152) Pulse Rate: 85 (06/07 1152) Intake/Output from previous day:  Intake/Output from this shift: No intake/output data recorded.  Labs: Recent Labs    02/09/18 0600 02/10/18 0501  WBC 7.1 7.7  HGB 12.4 12.3  HCT 38.5 37.2  PLT 251 261  APTT 29  --   CREATININE 0.84 0.98  MG 2.0  --    Potassium (mmol/L)  Date Value  02/10/2018 3.5  07/23/2012 3.3 (L)   Magnesium (mg/dL)  Date Value  02/09/2018 2.0  07/23/2012 1.9  ] Estimated Creatinine Clearance: 66.9 mL/min (by C-G formula based on SCr of 0.98 mg/dL).   Assessment: Pharmacy consulted for electrolyte monitoring and replacement in 52yo female admitted with hypokalemia. Patient does have Furosemide 20mg  TID ordered.   Goal of Therapy:  Electrolytes WNL  Plan:  K:3.5. Will order KCL 53mEq x1 dose since patient is receiving lasix TID. No additional replacement need at this time.  Will recheck electrolytes with AM labs and continue to replace electrolytes as needed.   Pernell Dupre, PharmD, BCPS Clinical Pharmacist 02/10/2018 11:53 AM

## 2018-02-10 NOTE — Consult Note (Signed)
ANTICOAGULATION CONSULT NOTE - Initial Consult  Pharmacy Consult for Heparin Drip  Indication: chest pain/ACS  Allergies  Allergen Reactions  . Tape Dermatitis and Other (See Comments)    Skin breakdown/dermatitis with prolonged use     Patient Measurements: Height: 5\' 5"  (165.1 cm) Weight: 175 lb 12.8 oz (79.7 kg) IBW/kg (Calculated) : 57 Heparin Dosing Weight: 74kg  Vital Signs: Temp: 97.5 F (36.4 C) (06/06 2005) Temp Source: Oral (06/06 2005) BP: 135/96 (06/06 2005) Pulse Rate: 71 (06/06 2005)  Labs: Recent Labs    02/09/18 0600 02/09/18 1209 02/09/18 1611 02/09/18 2050 02/10/18 0009  HGB 12.4  --   --   --   --   HCT 38.5  --   --   --   --   PLT 251  --   --   --   --   APTT 29  --   --   --   --   LABPROT 13.6  --   --   --   --   INR 1.05  --   --   --   --   HEPARINUNFRC  --   --  0.25*  --  0.39  CREATININE 0.84  --   --   --   --   TROPONINI 0.05* 0.05*  --  0.05* 0.05*    Estimated Creatinine Clearance: 80.8 mL/min (by C-G formula based on SCr of 0.84 mg/dL).   Medical History: Past Medical History:  Diagnosis Date  . Anxiety   . Arthritis    RA  . Cancer ([place])    skin melanoma  . CHF (congestive heart failure) ([place]) 02/09/2018  . Collagen vascular disease (HCC)    RA  . Complication of anesthesia    difficult intubation  . Depression   . Difficult airway   . 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 Diverticulitis of large intestine with abscess   . Diverticulitis of sigmoid colon   . 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 History of kidney stones 1994   x 1  . Hyperlipidemia   . Hypertension    history of  . Hyperthyroidism   . Hypokalemia   . Large intestine anastomotic leak 06/09/2017  . Obesity   . Rheumatoid arthritis(714.0)   . Stroke [facility]) 2013   no residual    Assessment: Pharmacy consulted for heparin dosing and monitoring in 25 female for ACS/NSTEMI.   Goal of Therapy:  Heparin level  0.3-0.7 units/ml Monitor platelets by anticoagulation protocol: Yes   Plan:  06/07 @ 0000 HL 0.39 therapeutic. Will continue current rate and will recheck w/ am labs.  Tobie Lords, PharmD Clinical Pharmacist 02/10/2018 2:42 AM

## 2018-02-10 NOTE — Progress Notes (Signed)
Patient has no significant CAD with severe LV systolic dysfunction and moderate MR on cath probably functional and mamy need to treat underlying cause cardiomyopathy with entresto/coreg and aldactone.

## 2018-02-10 NOTE — Progress Notes (Signed)
SUBJECTIVE: patient is feeling much better but has occasional chest pain.   Vitals:   02/10/18 0517 02/10/18 0809 02/10/18 0809 02/10/18 0846  BP: (!) 140/100 (!) 140/113 (!) 140/113 (!) 160/119  Pulse: 81 82 91 94  Resp:  14 14 20   Temp:  98.2 F (36.8 C) 98.2 F (36.8 C) 97.9 F (36.6 C)  TempSrc:  Oral Oral Oral  SpO2:  98% 99% 97%  Weight:      Height:        Intake/Output Summary (Last 24 hours) at 02/10/2018 0902 Last data filed at 02/09/2018 1800 Gross per 24 hour  Intake 4.38 ml  Output 2750 ml  Net -2745.62 ml    LABS: Basic Metabolic Panel: Recent Labs    02/09/18 0600 02/10/18 0501  NA 142 141  K 3.3* 3.5  CL 110 103  CO2 24 29  GLUCOSE 108* 87  BUN 24* 19  CREATININE 0.84 0.98  CALCIUM 8.2* 8.5*  MG 2.0  --    Liver Function Tests: No results for input(s): AST, ALT, ALKPHOS, BILITOT, PROT, ALBUMIN in the last 72 hours. No results for input(s): LIPASE, AMYLASE in the last 72 hours. CBC: Recent Labs    02/09/18 0600 02/10/18 0501  WBC 7.1 7.7  HGB 12.4 12.3  HCT 38.5 37.2  MCV 83.7 83.3  PLT 251 261   Cardiac Enzymes: Recent Labs    02/09/18 1209 02/09/18 2050 02/10/18 0009  TROPONINI 0.05* 0.05* 0.05*   BNP: Invalid input(s): POCBNP D-Dimer: No results for input(s): DDIMER in the last 72 hours. Hemoglobin A1C: No results for input(s): HGBA1C in the last 72 hours. Fasting Lipid Panel: No results for input(s): CHOL, HDL, LDLCALC, TRIG, CHOLHDL, LDLDIRECT in the last 72 hours. Thyroid Function Tests: No results for input(s): TSH, T4TOTAL, T3FREE, THYROIDAB in the last 72 hours.  Invalid input(s): FREET3 Anemia Panel: No results for input(s): VITAMINB12, FOLATE, FERRITIN, TIBC, IRON, RETICCTPCT in the last 72 hours.   PHYSICAL EXAM General: Well developed, well nourished, in no acute distress HEENT:  Normocephalic and atramatic Neck:  No JVD.  Lungs: Clear bilaterally to auscultation and percussion. Heart: HRRR . Normal S1 and S2  without gallops or murmurs.  Abdomen: Bowel sounds are positive, abdomen soft and non-tender  Msk:  Back normal, normal gait. Normal strength and tone for age. Extremities: No clubbing, cyanosis or edema.   Neuro: Alert and oriented X 3. Psych:  Good affect, responds appropriately  TELEMETRY: sinus rhythm  ASSESSMENT AND PLAN: congestive heart failure with left ventricular ejection fraction 31% and four-chamber dilated patient with severe mitral regurgitation. Patient will have right and left heart catheterization this morning and will evaluate the patient for mitral valve repair versus mitral valve replacement.  Active Problems:   CHF (congestive heart failure) (HCC)    Carrieanne Kleen A, MD, [facility] 02/10/2018 9:02 AM

## 2018-02-10 NOTE — Progress Notes (Signed)
Provided patient with "Living Better with Heart Failure" packet. Briefly reviewed definition of heart failure and signs and symptoms of an exacerbation. Reviewed importance of and reason behind checking weight daily in the AM, after using the bathroom, but before getting dressed. Discussed when to call the Dr= weight gain of >2lb overnight of 5lb in a week,  Discussed yellow zone= call MD: weight gain of >2lb overnight of 5lb in a week, increased swelling, increased SOB when lying down, chest discomfort, dizziness, increased fatigue Red Zone= call 911: struggle to breath, fainting or near fainting, significant chest pain Reviewed low sodium diet <2g/day-provided handout of recommended and not recommended foods  Fluid restriction <2L/day Reviewed how to read nutrition label Reviewed medication changes:lasix, spironolactone, carvedilol, Entresto, stop lisiniopril- Patient provided with coupon for first 30 days of entresto free Explained briefly why pt is on the medications (either make you feel better, live longer or keep you out of the hospital) and discussed monitoring and side effects Fatigue w/ carvedilol initiation/dose increase, orthostatic hypotension, dizziness, lightheadedness, s/sx dehydration/kindey injury Discussed tobacco cessation: pt does smoke- states she is ready to quite- not interested in medications to help. Thinks this has been motivation enough Discussed exercise: Pt has an active job. Discussed keeping HR up for 30 min. Discussed starting slow- 15 min at a time, 2 days a week and increasing to 30 min most days of the week as tolerated.   Ramond Dial, Pharm.D, BCPS Clinical Pharmacist

## 2018-02-10 NOTE — Consult Note (Signed)
ANTICOAGULATION CONSULT NOTE - Initial Consult  Pharmacy Consult for Heparin Drip  Indication: chest pain/ACS  Allergies  Allergen Reactions  . Tape Dermatitis and Other (See Comments)    Skin breakdown/dermatitis with prolonged use     Patient Measurements: Height: 5\' 5"  (165.1 cm) Weight: 175 lb 12.8 oz (79.7 kg) IBW/kg (Calculated) : 57 Heparin Dosing Weight: 74kg  Vital Signs: Temp: 97.7 F (36.5 C) (06/07 0515) Temp Source: Oral (06/07 0515) BP: 140/100 (06/07 0517) Pulse Rate: 81 (06/07 0517)  Labs: Recent Labs    02/09/18 0600 02/09/18 1209 02/09/18 1611 02/09/18 2050 02/10/18 0009 02/10/18 0501  HGB 12.4  --   --   --   --  12.3  HCT 38.5  --   --   --   --  37.2  PLT 251  --   --   --   --  261  APTT 29  --   --   --   --   --   LABPROT 13.6  --   --   --   --   --   INR 1.05  --   --   --   --   --   HEPARINUNFRC  --   --  0.25*  --  0.39 0.41  CREATININE 0.84  --   --   --   --  0.98  TROPONINI 0.05* 0.05*  --  0.05* 0.05*  --     Estimated Creatinine Clearance: 69.3 mL/min (by C-G formula based on SCr of 0.98 mg/dL).   Medical History: Past Medical History:  Diagnosis Date  . Anxiety   . Arthritis    RA  . Cancer ([place])    skin melanoma  . CHF (congestive heart failure) ([place]) 02/09/2018  . Collagen vascular disease (HCC)    RA  . Complication of anesthesia    difficult intubation  . Depression   . Difficult airway   . 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 Diverticulitis of large intestine with abscess   . Diverticulitis of sigmoid colon   . 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 History of kidney stones 1994   x 1  . Hyperlipidemia   . Hypertension    history of  . Hyperthyroidism   . Hypokalemia   . Large intestine anastomotic leak 06/09/2017  . Obesity   . Rheumatoid arthritis(714.0)   . Stroke [facility]) 2013   no residual    Assessment: Pharmacy consulted for heparin dosing and monitoring in 57 female  for ACS/NSTEMI.   Goal of Therapy:  Heparin level 0.3-0.7 units/ml Monitor platelets by anticoagulation protocol: Yes   Plan:  06/07 @ 0000 HL 0.39 therapeutic. Will continue current rate and will recheck w/ am labs.  06/07 @ 0500 HL 0.41 therapeutic. Will continue current rate and will recheck w/ am labs.  Tobie Lords, PharmD Clinical Pharmacist 02/10/2018 5:51 AM

## 2018-02-10 NOTE — Discharge Summary (Signed)
[facility] NAME: Rebecca Obrien    MR#:  [id number]  DATE OF BIRTH:  Apr 12, 1966  DATE OF ADMISSION:  02/09/2018 ADMITTING PHYSICIAN: Fritzi Mandes, MD  DATE OF DISCHARGE: 02/10/2018  PRIMARY CARE PHYSICIAN: Marguerita Merles, MD    ADMISSION DIAGNOSIS:  Shortness of breath [R06.02] Peripheral edema [R60.9] Leg swelling [M79.89] Elevated troponin [R74.8] Pulmonary vascular congestion [R09.89] Chest pain, unspecified type [R07.9]  DISCHARGE DIAGNOSIS:  CHF acute new systolic Severe CMP Moderate MR  SECONDARY DIAGNOSIS:   Past Medical History:  Diagnosis Date  . Anxiety   . Arthritis    RA  . Cancer ([place])    skin melanoma  . CHF (congestive heart failure) ([place]) 02/09/2018  . Collagen vascular disease (H[facility])    RA  . Complication of anesthesia    difficult intubation  . Depression   . Difficult airway   . 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 Diverticulitis of large intestine with abscess   . Diverticulitis of sigmoid colon   . 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 History of kidney stones 1994   x 1  . Hyperlipidemia   . Hypertension    history of  . Hyperthyroidism   . Hypokalemia   . Large intestine anastomotic leak 06/09/2017  . Obesity   . Rheumatoid arthritis(714.0)   . Stroke [facility]) 2013   no residual    HOSPITAL COURSE:   Rebecca E Torainis a 52 y.o.femalewho returns to the ED from home with a chief complaint of bilateral leg swelling. Patient has a history of melanoma status post lymph node resection of her left lower extremity. Has chronic swelling to that extremity  *Acute systolic congestive heart failure new onset  -patient presented with increasing shortness of breath with elevated BNP chest x-ray consistent with pulmonary vascular congestion -Lasix, beta blockers and ACE inhibitor, aldcatone D/c IV heparin drip -Dr. Humphrey Rolls cardiology. Will DC it. Cardiac cath  showed NO cad , moderate MR  *Elevated troponin in the setting of CHF appears demand ischemia. -The heparin drip discontinued -plans for left heart catheterization noted. -Echo shows possibility of severe MR.  *Anxiety continue Ativan PRN  *Hypertension continue home meds  *History of melanoma  Patient hemodynamically stable. She is on room air. Discharged to home with outpatient follow-up with Dr. Yancey Flemings. Discussed with patient and her parents in the room. importance of diet and follow-up recommended. CONSULTS OBTAINED:  Treatment Team:  Dionisio David, MD  DRUG ALLERGIES:   Allergies  Allergen Reactions  . Tape Dermatitis and Other (See Comments)    Skin breakdown/dermatitis with prolonged use     DISCHARGE MEDICATIONS:   Allergies as of 02/10/2018      Reactions   Tape Dermatitis, Other (See Comments)   Skin breakdown/dermatitis with prolonged use       Medication List    STOP taking these medications   lisinopril 20 MG tablet Commonly known as:  PRINIVIL,ZESTRIL     TAKE these medications   aspirin 81 MG EC tablet Take 1 tablet (81 mg total) by mouth daily.   carvedilol 6.25 MG tablet Commonly known as:  COREG Take 1 tablet (6.25 mg total) by mouth 2 (two) times daily with a meal.   furosemide 20 MG tablet Commonly known as:  LASIX Take 1 tablet (20 mg total) by mouth daily.   leflunomide 20 MG tablet Commonly known as:  ARAVA Take  20 mg by mouth daily.   levothyroxine 112 MCG tablet Commonly known as:  SYNTHROID, LEVOTHROID Take 112 mcg by mouth daily before breakfast.   LORazepam 0.5 MG tablet Commonly known as:  ATIVAN Take 1 tablet (0.5 mg total) by mouth 2 (two) times daily as needed for anxiety. What changed:    when to take this  additional instructions   meloxicam 15 MG tablet Commonly known as:  MOBIC Take 15 mg by mouth daily.   predniSONE 5 MG tablet Commonly known as:  DELTASONE Take 5 mg by mouth daily.    sacubitril-valsartan 24-26 MG Commonly known as:  ENTRESTO Take 1 tablet by mouth 2 (two) times daily.   sertraline 25 MG tablet Commonly known as:  ZOLOFT Take 1 tablet by mouth once daily for one week, then 2 tablets daily   spironolactone 25 MG tablet Commonly known as:  ALDACTONE Take 0.5 tablets (12.5 mg total) by mouth daily.       If you experience worsening of your admission symptoms, develop shortness of breath, life threatening emergency, suicidal or homicidal thoughts you must seek medical attention immediately by calling 911 or calling your MD immediately  if symptoms less severe.  You Must read complete instructions/literature along with all the possible adverse reactions/side effects for all the Medicines you take and that have been prescribed to you. Take any new Medicines after you have completely understood and accept all the possible adverse reactions/side effects.   Please note  You were cared for by a hospitalist during your hospital stay. If you have any questions about your discharge medications or the care you received while you were in the hospital after you are discharged, you can call the unit and asked to speak with the hospitalist on call if the hospitalist that took care of you is not available. Once you are discharged, your primary care physician will handle any further medical issues. Please note that NO REFILLS for any discharge medications will be authorized once you are discharged, as it is imperative that you return to your primary care physician (or establish a relationship with a primary care physician if you do not have one) for your aftercare needs so that they can reassess your need for medications and monitor your lab values. Today   SUBJECTIVE   No new complaints  VITAL SIGNS:  Blood pressure (!) 139/101, pulse 85, temperature 98.4 F (36.9 C), temperature source Oral, resp. rate 14, height 5\' 5"  (1.651 m), weight 74.1 kg (163 lb 4.8 oz), SpO2  96 %.  I/O:    Intake/Output Summary (Last 24 hours) at 02/10/2018 1412 Last data filed at 02/10/2018 1221 Gross per 24 hour  Intake 4.38 ml  Output 1950 ml  Net -1945.62 ml    PHYSICAL EXAMINATION:  GENERAL:  52 y.o.-year-old patient lying in the bed with no acute distress.  EYES: Pupils equal, round, reactive to light and accommodation. No scleral icterus. Extraocular muscles intact.  HEENT: Head atraumatic, normocephalic. Oropharynx and nasopharynx clear.  NECK:  Supple, no jugular venous distention. No thyroid enlargement, no tenderness.  LUNGS: Normal breath sounds bilaterally, no wheezing, rales,rhonchi or crepitation. No use of accessory muscles of respiration.  CARDIOVASCULAR: S1, S2 normal. No murmurs, rubs, or gallops.  ABDOMEN: Soft, non-tender, non-distended. Bowel sounds present. No organomegaly or mass.  EXTREMITIES: No pedal edema, cyanosis, or clubbing.  NEUROLOGIC: Cranial nerves II through XII are intact. Muscle strength 5/5 in all extremities. Sensation intact. Gait not checked.  PSYCHIATRIC: The  patient is alert and oriented x 3.  SKIN: No obvious rash, lesion, or ulcer.   DATA REVIEW:   CBC  Recent Labs  Lab 02/10/18 0501  WBC 7.7  HGB 12.3  HCT 37.2  PLT 261    Chemistries  Recent Labs  Lab 02/09/18 0600 02/10/18 0501  NA 142 141  K 3.3* 3.5  CL 110 103  CO2 24 29  GLUCOSE 108* 87  BUN 24* 19  CREATININE 0.84 0.98  CALCIUM 8.2* 8.5*  MG 2.0  --     Microbiology Results   No results found for this or any previous visit (from the past 240 hour(s)).  RADIOLOGY:  Dg Chest 2 View  Result Date: 02/09/2018 CLINICAL DATA:  Acute onset of shortness of breath and bilateral leg swelling. Upper chest heaviness. EXAM: CHEST - 2 VIEW COMPARISON:  Chest radiograph performed 07/23/2012 FINDINGS: The lungs are well-aerated. Mild vascular congestion is noted. Increased interstitial markings may reflect mild interstitial edema. A small right pleural effusion  is noted. There is no evidence of pneumothorax. The heart is borderline normal in size. No acute osseous abnormalities are seen. IMPRESSION: Mild vascular congestion noted. Increased interstitial markings may reflect mild interstitial edema. Small right pleural effusion noted. Electronically Signed   By: Garald Balding M.D.   On: 02/09/2018 06:12   US Venous Img Lower Bilateral  Result Date: 02/09/2018 CLINICAL DATA:  Bilateral lower extremity swelling left greater than right, shortness of Breath EXAM: BILATERAL LOWER EXTREMITY VENOUS DOPPLER ULTRASOUND TECHNIQUE: Gray-scale sonography with graded compression, as well as color Doppler and duplex ultrasound were performed to evaluate the lower extremity deep venous systems from the level of the common femoral vein and including the common femoral, femoral, profunda femoral, popliteal and calf veins including the posterior tibial, peroneal and gastrocnemius veins when visible. The superficial great saphenous vein was also interrogated. Spectral Doppler was utilized to evaluate flow at rest and with distal augmentation maneuvers in the common femoral, femoral and popliteal veins. COMPARISON:  01/28/2018 FINDINGS: RIGHT LOWER EXTREMITY Common Femoral Vein: No evidence of thrombus. Normal compressibility, respiratory phasicity and response to augmentation. Saphenofemoral Junction: No evidence of thrombus. Normal compressibility and flow on color Doppler imaging. Profunda Femoral Vein: No evidence of thrombus. Normal compressibility and flow on color Doppler imaging. Femoral Vein: No evidence of thrombus. Normal compressibility, respiratory phasicity and response to augmentation. Popliteal Vein: No evidence of thrombus. Normal compressibility, respiratory phasicity and response to augmentation. Calf Veins: No evidence of thrombus. Normal compressibility and flow on color Doppler imaging. Superficial Great Saphenous Vein: No evidence of thrombus. Normal compressibility.  Venous Reflux:  None. Other Findings:  None. LEFT LOWER EXTREMITY Common Femoral Vein: No evidence of thrombus. Normal compressibility, respiratory phasicity and response to augmentation. Saphenofemoral Junction: No evidence of thrombus. Normal compressibility and flow on color Doppler imaging. Profunda Femoral Vein: No evidence of thrombus. Normal compressibility and flow on color Doppler imaging. Femoral Vein: No evidence of thrombus. Normal compressibility, respiratory phasicity and response to augmentation. Popliteal Vein: No evidence of thrombus. Normal compressibility, respiratory phasicity and response to augmentation. Calf Veins: No evidence of thrombus. Normal compressibility and flow on color Doppler imaging. Superficial Great Saphenous Vein: No evidence of thrombus. Normal compressibility. Venous Reflux:  None. Other Findings:  None. IMPRESSION: No evidence of deep venous thrombosis bilaterally. Electronically Signed   By: Inez Catalina M.D.   On: 02/09/2018 08:33     Management plans discussed with the patient, family and they are in  agreement.  CODE STATUS:     Code Status Orders  (From admission, onward)        Start     Ordered   02/09/18 0858  Full code  Continuous     02/09/18 0857    Code Status History    Date Active Date Inactive Code Status Order ID Comments User Context   09/15/2017 1308 09/17/2017 1300 Full Code [id number]  Clayburn Pert, MD Inpatient   07/01/2017 1731 07/06/2017 1805 Full Code [id number]  Bettey Costa, MD Inpatient   06/09/2017 0237 06/20/2017 2016 Full Code [id number]  Vickie Epley, MD Inpatient   05/31/2017 1518 06/03/2017 1613 Full Code [id number]  Clayburn Pert, MD Inpatient   05/03/2017 2103 05/07/2017 1326 Full Code [id number]  Vickie Epley, MD Inpatient   04/27/2017 1654 04/30/2017 1949 Full Code [id number]  Clayburn Pert, MD Inpatient    Advance Directive Documentation     Most Recent Value  Type of Advance Directive  Healthcare Power of  Attorney  Pre-existing out of facility DNR order (yellow form or pink MOST form)  -  "MOST" Form in Place?  -      TOTAL TIME TAKING CARE OF THIS PATIENT: *40* minutes.    Fritzi Mandes M.D on 02/10/2018 at 2:12 PM  Between 7am to 6pm - Pager - [phone] After 6pm go to [email or web address] - password [email or web address] [facility]  Office  [phone]  CC: Primary care physician; Marguerita Merles, MD

## 2018-02-13 DIAGNOSIS — I214 Non-ST elevation (NSTEMI) myocardial infarction: Secondary | ICD-10-CM | POA: Diagnosis not present

## 2018-02-13 DIAGNOSIS — E079 Disorder of thyroid, unspecified: Secondary | ICD-10-CM | POA: Diagnosis not present

## 2018-02-13 DIAGNOSIS — M059 Rheumatoid arthritis with rheumatoid factor, unspecified: Secondary | ICD-10-CM | POA: Diagnosis not present

## 2018-02-13 DIAGNOSIS — I509 Heart failure, unspecified: Secondary | ICD-10-CM | POA: Diagnosis not present

## 2018-02-13 DIAGNOSIS — C439 Malignant melanoma of skin, unspecified: Secondary | ICD-10-CM | POA: Diagnosis not present

## 2018-02-14 DIAGNOSIS — I420 Dilated cardiomyopathy: Secondary | ICD-10-CM | POA: Diagnosis not present

## 2018-02-15 ENCOUNTER — Other Ambulatory Visit: Payer: Self-pay | Admitting: *Deleted

## 2018-02-15 NOTE — Patient Outreach (Addendum)
[facility]) Care Management  02/15/2018  Rebecca Obrien November 24, 1965 [id number]   Subjective: Telephone call to patient's home  / mobile number, no answer, left HIPAA compliant voicemail message, and requested call back.    Objective: Per KPN (Knowledge Performance Now, point of care tool) and chart review, patient hospitalized 02/09/18 - 02/10/18 for CHF (congestive heart failure), acute, systolic, new.    Patient hospitalized 09/15/17 -07/18/18 for Loop ileostomy takedown.   Patient hospitalized 07/01/17 -07/06/17 for dehydration.Patient has ED visit on 06/22/17 for Postoperative pain and ostomy leaking. Patient hospitalized 06/08/17 - 06/20/17 for Large intestine anastomotic leak. Status post Exploratory laparotomy, washout of stool/abscess, Placement of pelvic 37F Blake drains x 2, Creation of diverting loop ileostomy on 06/09/17. Patient hospitalized 05/2517 -05/2817 for persistent diverticulitis. Status post Laparoscopic sigmoid colectomy with immediate anastomosis, mobilization of splenic flexure on 05/31/17. Patient hospitalized 05/03/17 - 05/07/17 for Diverticulitis of intestine with abscess. Patient hospitalized 04/27/17 -04/30/17 for Diverticulitis with microperforation. Patient also has a history of Rheumatoid arthritis, hypertension, hyperlipidemia, skin melanomaand stroke.  [facility] Care Management completed transition of care follow up on 09/19/17.      Assessment:Received UMR Transition of care referral on 02/14/18.   Transition of care follow up pending patient contact.     Plan:RNCM will send unsuccessful outreach  letter, [facility] pamphlet, will call patient for 2nd telephone outreach attempt, transition of care follow up, and proceed with case closure, within 10 business days if no return call.     Kamare Caspers H. [name], BSN, [place] Management [facility] Telephonic CM Phone: [phone] Fax: [phone]

## 2018-02-17 ENCOUNTER — Other Ambulatory Visit: Payer: Self-pay | Admitting: *Deleted

## 2018-02-17 NOTE — Patient Outreach (Signed)
[facility]) Care Management  02/17/2018  Rebecca Obrien 1965/10/21 [id number]   Subjective: Telephone call to patient's home  / mobile number, no answer, left HIPAA compliant voicemail message, and requested call back.    Objective:Per KPN (Knowledge Performance Now, point of care tool) and chart review,patient hospitalized 02/09/18 - 02/10/18 for CHF (congestive heart failure), acute, systolic, new.    Patient hospitalized 09/15/17 -07/18/18 forLoop ileostomy takedown.Patient hospitalized 07/01/17 -07/06/17 for dehydration.Patient has ED visit on 06/22/17 for Postoperative pain and ostomy leaking. Patient hospitalized 06/08/17 - 06/20/17 for Large intestine anastomotic leak. Status post Exploratory laparotomy, washout of stool/abscess, Placement of pelvic 64F Blake drains x 2, Creation of diverting loop ileostomy on 06/09/17. Patient hospitalized 05/2517 -05/2817 for persistent diverticulitis. Status post Laparoscopic sigmoid colectomy with immediate anastomosis, mobilization of splenic flexure on 05/31/17. Patient hospitalized 05/03/17 - 05/07/17 for Diverticulitis of intestine with abscess. Patient hospitalized 04/27/17 -04/30/17 for Diverticulitis with microperforation. Patient also has a history of Rheumatoid arthritis, hypertension, hyperlipidemia, skin melanomaand stroke. [facility] Care Management completed transition of care follow up on 09/19/17.      Assessment:Received UMR Transition of care referral on 02/14/18.Transition of care follow up pending patient contact.     Plan:RNCM has sent unsuccessful outreach  letter, [facility] pamphlet, will call patient for 3rd telephone outreach attempt, transition of care follow up, and proceed with case closure, within 10 business days if no return call.    Breiona Couvillon H. [name], BSN, [place] Management [facility] Telephonic CM Phone: [phone] Fax: [phone]

## 2018-02-20 ENCOUNTER — Other Ambulatory Visit: Payer: Self-pay | Admitting: *Deleted

## 2018-02-20 NOTE — Patient Outreach (Signed)
[facility]) Care Management  02/20/2018  Rebecca Obrien 02-Sep-1966 [id number]   Subjective:Telephone call to patient's home / mobile number, no answer, left HIPAA compliant voicemail message, and requested call back.    Objective:Per KPN (Knowledge Performance Now, point of care tool) and chart review,patient hospitalized 02/09/18 - 02/10/18 forCHF (congestive heart failure), acute, systolic, new. Patient hospitalized 09/15/17 -07/18/18 forLoop ileostomy takedown.Patient hospitalized 07/01/17 -07/06/17 for dehydration.Patient has ED visit on 06/22/17 for Postoperative pain and ostomy leaking. Patient hospitalized 06/08/17 - 06/20/17 for Large intestine anastomotic leak. Status post Exploratory laparotomy, washout of stool/abscess, Placement of pelvic 67F Blake drains x 2, Creation of diverting loop ileostomy on 06/09/17. Patient hospitalized 05/2517 -05/2817 for persistent diverticulitis. Status post Laparoscopic sigmoid colectomy with immediate anastomosis, mobilization of splenic flexure on 05/31/17. Patient hospitalized 05/03/17 - 05/07/17 for Diverticulitis of intestine with abscess. Patient hospitalized 04/27/17 -04/30/17 for Diverticulitis with microperforation. Patient also has a history of Rheumatoid arthritis, hypertension, hyperlipidemia, skin melanomaand stroke. [place] Managementcompleted transition of care follow up on 09/19/17.    Assessment:Received UMR Transition of care referral on 02/14/18.Transition of care follow up pending patient contact.    Plan:RNCM has sent unsuccessful outreach letter, [facility] pamphlet, and will proceed with case closure, within 10 business days if no return call.     Terah Robey H. [name], BSN, [place] Management [facility] Telephonic CM Phone: [phone] Fax: [phone]

## 2018-02-21 DIAGNOSIS — I5020 Unspecified systolic (congestive) heart failure: Secondary | ICD-10-CM | POA: Diagnosis not present

## 2018-02-27 DIAGNOSIS — I10 Essential (primary) hypertension: Secondary | ICD-10-CM | POA: Diagnosis not present

## 2018-02-27 DIAGNOSIS — I509 Heart failure, unspecified: Secondary | ICD-10-CM | POA: Diagnosis not present

## 2018-02-28 ENCOUNTER — Other Ambulatory Visit: Payer: Self-pay | Admitting: *Deleted

## 2018-02-28 NOTE — Patient Outreach (Signed)
[facility]) Care Management  02/28/2018  Rebecca Obrien [birth year] [id number]   No response from patient outreach attempts will proceed with case closure.     Objective:Per KPN (Knowledge Performance Now, point of care tool) and chart review,patient hospitalized 02/09/18 - 02/10/18 forCHF (congestive heart failure), acute, systolic, new. Patient hospitalized 09/15/17 -07/18/18 forLoop ileostomy takedown.Patient hospitalized 07/01/17 -07/06/17 for dehydration.Patient has ED visit on 06/22/17 for Postoperative pain and ostomy leaking. Patient hospitalized 06/08/17 - 06/20/17 for Large intestine anastomotic leak. Status post Exploratory laparotomy, washout of stool/abscess, Placement of pelvic 36F Blake drains x 2, Creation of diverting loop ileostomy on 06/09/17. Patient hospitalized 05/2517 -05/2817 for persistent diverticulitis. Status post Laparoscopic sigmoid colectomy with immediate anastomosis, mobilization of splenic flexure on 05/31/17. Patient hospitalized 05/03/17 - 05/07/17 for Diverticulitis of intestine with abscess. Patient hospitalized 04/27/17 -04/30/17 for Diverticulitis with microperforation. Patient also has a history of Rheumatoid arthritis, hypertension, hyperlipidemia, skin melanomaand stroke. [place] Managementcompleted transition of care follow up on 09/19/17.    Assessment:Received UMR Transition of care referral on 02/14/18.Transition of care follow up not completed due to unable to contact patient and will proceed with case closure.     Plan:Case closure due to unable to reach.      Chelsi Warr H. [name], BSN, [place] Management [facility] Telephonic CM Phone: [phone] Fax: [phone]

## 2018-03-02 ENCOUNTER — Ambulatory Visit: Payer: 59 | Attending: Family | Admitting: Family

## 2018-03-02 ENCOUNTER — Encounter: Payer: Self-pay | Admitting: Family

## 2018-03-02 VITALS — BP 110/75 | HR 66 | Resp 18 | Ht 65.0 in | Wt 158.0 lb

## 2018-03-02 DIAGNOSIS — I509 Heart failure, unspecified: Secondary | ICD-10-CM | POA: Diagnosis present

## 2018-03-02 DIAGNOSIS — F329 Major depressive disorder, single episode, unspecified: Secondary | ICD-10-CM | POA: Insufficient documentation

## 2018-03-02 DIAGNOSIS — Z79899 Other long term (current) drug therapy: Secondary | ICD-10-CM | POA: Insufficient documentation

## 2018-03-02 DIAGNOSIS — E785 Hyperlipidemia, unspecified: Secondary | ICD-10-CM | POA: Diagnosis not present

## 2018-03-02 DIAGNOSIS — Z85820 Personal history of malignant melanoma of skin: Secondary | ICD-10-CM | POA: Insufficient documentation

## 2018-03-02 DIAGNOSIS — Z8249 Family history of ischemic heart disease and other diseases of the circulatory system: Secondary | ICD-10-CM | POA: Diagnosis not present

## 2018-03-02 DIAGNOSIS — Z90710 Acquired absence of both cervix and uterus: Secondary | ICD-10-CM | POA: Insufficient documentation

## 2018-03-02 DIAGNOSIS — F419 Anxiety disorder, unspecified: Secondary | ICD-10-CM | POA: Insufficient documentation

## 2018-03-02 DIAGNOSIS — C779 Secondary and unspecified malignant neoplasm of lymph node, unspecified: Secondary | ICD-10-CM

## 2018-03-02 DIAGNOSIS — Z9049 Acquired absence of other specified parts of digestive tract: Secondary | ICD-10-CM | POA: Diagnosis not present

## 2018-03-02 DIAGNOSIS — F17210 Nicotine dependence, cigarettes, uncomplicated: Secondary | ICD-10-CM | POA: Diagnosis not present

## 2018-03-02 DIAGNOSIS — Z79890 Hormone replacement therapy (postmenopausal): Secondary | ICD-10-CM | POA: Diagnosis not present

## 2018-03-02 DIAGNOSIS — Z87442 Personal history of urinary calculi: Secondary | ICD-10-CM | POA: Insufficient documentation

## 2018-03-02 DIAGNOSIS — Z8673 Personal history of transient ischemic attack (TIA), and cerebral infarction without residual deficits: Secondary | ICD-10-CM | POA: Diagnosis not present

## 2018-03-02 DIAGNOSIS — Z791 Long term (current) use of non-steroidal anti-inflammatories (NSAID): Secondary | ICD-10-CM | POA: Diagnosis not present

## 2018-03-02 DIAGNOSIS — M069 Rheumatoid arthritis, unspecified: Secondary | ICD-10-CM | POA: Diagnosis not present

## 2018-03-02 DIAGNOSIS — I5022 Chronic systolic (congestive) heart failure: Secondary | ICD-10-CM | POA: Diagnosis not present

## 2018-03-02 DIAGNOSIS — Z720 Tobacco use: Secondary | ICD-10-CM

## 2018-03-02 DIAGNOSIS — I110 Hypertensive heart disease with heart failure: Secondary | ICD-10-CM | POA: Diagnosis not present

## 2018-03-02 DIAGNOSIS — C439 Malignant melanoma of skin, unspecified: Secondary | ICD-10-CM

## 2018-03-02 DIAGNOSIS — I10 Essential (primary) hypertension: Secondary | ICD-10-CM

## 2018-03-02 DIAGNOSIS — Z7982 Long term (current) use of aspirin: Secondary | ICD-10-CM | POA: Diagnosis not present

## 2018-03-02 DIAGNOSIS — Z888 Allergy status to other drugs, medicaments and biological substances status: Secondary | ICD-10-CM | POA: Diagnosis not present

## 2018-03-02 NOTE — Progress Notes (Signed)
Patient ID: Rebecca Obrien, female    DOB: 05/27/1966, 52 y.o.   MRN: [id number]  HPI  Ms Dapolito is a 52 y/o female with a history of malignant melanoma, hyperlipidemia, HTN, stroke, thyroid disease, arthritis, diverticulitis, anxiety, depression, current tobacco use and chronic heart failure.   Echo report from 02/09/18 reviewed and showed an EF of 30% along with severe MR. Cardiac catheterization done 02/10/18 and showed no significant CAD with severe LV systolic dysfunction and moderate MR.   Admitted 02/09/18 due to new onset HF. Elevated troponin thought to be due to demand ischemia. Cardiology consulted and catheterization done. Medications initiated and she was discharged the next day. Was in the ED 01/28/18 due to peripheral edema where she was treated and released.   She presents today for her initial visit with a chief complaint of rhinorrhea. She describes this as being fairly new and thinks that it could be related to a medication or to the weather. She does not have any associated symptoms. She denies any difficulty sleeping, abdominal distention, palpitations, pedal edema, chest pain, shortness of breath, cough, dizziness, fatigue or weight gain. Currently wearing a lifevest while her medications are being titrated up. Says that she's scheduled for an echocardiogram at cardiology office on 03/30/18  Past Medical History:  Diagnosis Date  . Anxiety   . Arthritis    RA  . Cancer ([place])    skin melanoma  . CHF (congestive heart failure) ([place]) 02/09/2018  . Collagen vascular disease ([facility])    RA  . Complication of anesthesia    difficult intubation  . Depression   . Difficult airway   . 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 Diverticulitis of large intestine with abscess   . Diverticulitis of sigmoid colon   . 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 History of kidney stones 1994   x 1  . Hyperlipidemia   . Hypertension    history of  . Hyperthyroidism    . Hypokalemia   . Large intestine anastomotic leak 06/09/2017  . Obesity   . Rheumatoid arthritis(714.0)   . Stroke [facility]) 2013   no residual   Past Surgical History:  Procedure Laterality Date  . ABDOMINAL HYSTERECTOMY    . CARDIAC CATHETERIZATION    . CARPAL TUNNEL RELEASE  Bilateral  . CHOLECYSTECTOMY    . CYSTOSCOPY WITH STENT PLACEMENT Bilateral 05/31/2017   Procedure: CYSTOSCOPY WITH STENT PLACEMENT- LIGHTED STENTS;  Surgeon: Hollice Espy, MD;  Location: [facility] OR[facility];  Service: Urology;  Laterality: Bilateral;  . EXCISION MELANOMA WITH SENTINEL LYMPH NODE BIOPSY Left 12/2016   Left calf with lymph nodes removed in left groin.  . ILEOSTOMY N/A 06/08/2017   Procedure: ILEOSTOMY;  Surgeon: Vickie Epley, MD;  Location: [place] [facility];  Service: General;  Laterality: N/A;  . ILEOSTOMY CLOSURE N/A 09/15/2017   Procedure: LOOP ILEOSTOMY TAKEDOWN;  Surgeon: Clayburn Pert, MD;  Location: [facility];  Service: General;  Laterality: N/A;  . LAPAROSCOPIC SIGMOID COLECTOMY N/A 05/31/2017   Procedure: LAPAROSCOPIC SIGMOID COLECTOMY;  Surgeon: Clayburn Pert, MD;  Location: [facility];  Service: General;  Laterality: N/A;  . LAPAROTOMY N/A 06/08/2017   Procedure: EXPLORATORY LAPAROTOMY;  Surgeon: Vickie Epley, MD;  Location: [facility];  Service: General;  Laterality: N/A;  . LEFT HEART CATH AND CORONARY ANGIOGRAPHY N/A 02/10/2018   Procedure: LEFT HEART CATH AND CORONARY ANGIOGRAPHY;  Surgeon: Dionisio David, MD;  Location: [place] [facility];  Service: Cardiovascular;  Laterality: N/A;  . TEE WITHOUT CARDIOVERSION  07/26/2012   Procedure: TRANSESOPHAGEAL ECHOCARDIOGRAM (TEE);  Surgeon: Thayer Headings, MD;  Location: [place];  Service: Cardiovascular;  Laterality: N/A;  . TONSILLECTOMY    . TOTAL THYROIDECTOMY     Family History  Problem Relation Age of Onset  . Nephrolithiasis Mother   . Hypertension Mother   . Hypertension Father   . Heart failure Father   . Cardiomyopathy Brother     Social History   Tobacco Use  . Smoking status: Current Every Day Smoker    Packs/day: 0.10    Years: 23.00    Pack years: 2.30    Types: Cigarettes    Last attempt to quit: 07/23/2012    Years since quitting: 5.6  . Smokeless tobacco: Never Used  . Tobacco comment: 03/02/2018 down to 2 cigs a day  Substance Use Topics  . Alcohol use: No   Allergies  Allergen Reactions  . Tape Dermatitis and Other (See Comments)    Skin breakdown/dermatitis with prolonged use    Prior to Admission medications   Medication Sig Start Date End Date Taking? Authorizing Provider  aspirin EC 81 MG EC tablet Take 1 tablet (81 mg total) by mouth daily. 02/10/18  Yes Fritzi Mandes, MD  carvedilol (COREG) 12.5 MG tablet Take 12.5 mg by mouth 2 (two) times daily with a meal.   Yes [provider]  furosemide (LASIX) 20 MG tablet Take 1 tablet (20 mg total) by mouth daily. Patient taking differently: Take 20 mg by mouth as needed.  02/10/18  Yes Fritzi Mandes, MD  leflunomide (ARAVA) 20 MG tablet Take 20 mg by mouth daily. 11/09/17  Yes [provider]  levothyroxine (SYNTHROID, LEVOTHROID) 112 MCG tablet Take 112 mcg by mouth daily before breakfast.   Yes [provider]  LORazepam (ATIVAN) 0.5 MG tablet Take 1 tablet (0.5 mg total) by mouth 2 (two) times daily as needed for anxiety. Patient taking differently: Take 0.5 mg by mouth daily. May take an additional 0.5 mg dose as needed for anxiety 07/06/17  Yes Mody, Sital, MD  meloxicam (MOBIC) 15 MG tablet Take 15 mg by mouth daily.   Yes [provider]  predniSONE (DELTASONE) 5 MG tablet Take 5 mg by mouth daily.   Yes [provider]  sacubitril-valsartan (ENTRESTO) 24-26 MG Take 1 tablet by mouth 2 (two) times daily. 02/10/18  Yes Fritzi Mandes, MD  sertraline (ZOLOFT) 25 MG tablet Take 50 mg by mouth daily. Take 1 tablet by mouth once daily for one week, then 2 tablets daily 02/06/18  Yes [provider]   spironolactone (ALDACTONE) 25 MG tablet Take 0.5 tablets (12.5 mg total) by mouth daily. 02/10/18  Yes Fritzi Mandes, MD    Review of Systems  Constitutional: Negative for appetite change and fatigue.  HENT: Positive for rhinorrhea. Negative for congestion, postnasal drip and sore throat.   Eyes: Negative.   Respiratory: Negative for shortness of breath.   Cardiovascular: Negative for chest pain, palpitations and leg swelling.  Gastrointestinal: Negative for abdominal distention and abdominal pain.  Endocrine: Negative.   Genitourinary: Negative.   Musculoskeletal: Negative for back pain and neck pain.  Skin: Negative.   Allergic/Immunologic: Negative.   Neurological: Negative for dizziness and light-headedness.  Hematological: Negative for adenopathy. Does not bruise/bleed easily.  Psychiatric/Behavioral: Negative for dysphoric mood and sleep disturbance (sleeping on 3 pillows (long-term)). The patient is not nervous/anxious.    Vitals:  03/02/18 1213  BP: 110/75  Pulse: 66  Resp: 18  SpO2: 98%  Weight: 158 lb (71.7 kg)  Height: 5\' 5"  (1.651 m)   Wt Readings from Last 3 Encounters:  03/02/18 158 lb (71.7 kg)  02/10/18 163 lb 4.8 oz (74.1 kg)  01/28/18 168 lb (76.2 kg)   Lab Results  Component Value Date   CREATININE 0.98 02/10/2018   CREATININE 0.84 02/09/2018   CREATININE 0.87 01/28/2018   Physical Exam  Constitutional: She is oriented to person, place, and time. She appears well-developed and well-nourished.  HENT:  Head: Normocephalic and atraumatic.  Neck: Normal range of motion. Neck supple. No JVD present.  Cardiovascular: Normal rate and regular rhythm.  Pulmonary/Chest: Effort normal. No respiratory distress. She has no rales.  Abdominal: Soft. She exhibits no distension. There is no tenderness.  Musculoskeletal: She exhibits no edema or tenderness.  Neurological: She is alert and oriented to person, place, and time.  Skin: Skin is warm and dry.  Psychiatric:  She has a normal mood and affect. Her behavior is normal. Thought content normal.  Nursing note and vitals reviewed.  Assessment & Plan:  1: Chronic heart failure with reduced ejection fraction- - NYHA class I - euvolemic - weighing daily and says that her weight has been stable. Instructed to call for an overnight weight gain of >2 pounds or a weekly weight gain of >5 pounds - not adding salt and has been trying to read food labels. Reviewed the importance of closely following a 2000mg  sodium diet and written dietary information was given to her about this - has recently had her carvedilol increased and says that her entresto may be increased when she returns to cardiology - follows with cardiology Humphrey Rolls) - currently wearing LifeVest - does work night shift in environmental services at [employer] but has not returned to work yet - BNP 02/09/18 was 795.0  2: HTN- - sees PCP Lennox Grumbles) at [facility] and has seen her since hospital discharge - BP looks good today - BMP 02/10/18 reviewed and showed sodium 141, potassium 3.5 and GFR >60  3: Tobacco use- - smoking 2 cigarettes daily and says that next week, her goal is to decrease it to 1 cigarette daily - complete cessation discussed with her for 3 minutes  4: Malignant melanoma- - has had lymph nodes removed in left inguinal area - saw oncologist Rosalee Kaufman) 01/16/18  Medication list was reviewed.  Return in 6 weeks or sooner for any questions/problems before then.

## 2018-03-02 NOTE — Patient Instructions (Addendum)
Continue weighing daily and call for an overnight weight gain of > 2 pounds or a weekly weight gain of >5 pounds.  Bring medication bottles to every visit   Low-Sodium Eating Plan Sodium, which is an element that makes up salt, helps you maintain a healthy balance of fluids in your body. Too much sodium can increase your blood pressure and cause fluid and waste to be held in your body. Your health care provider or dietitian may recommend following this plan if you have high blood pressure (hypertension), kidney disease, liver disease, or heart failure. Eating less sodium can help lower your blood pressure, reduce swelling, and protect your heart, liver, and kidneys. What are tips for following this plan? General guidelines  Most people on this plan should limit their sodium intake to 2,000 mg (milligrams) of sodium each day. Reading food labels  The Nutrition Facts label lists the amount of sodium in one serving of the food. If you eat more than one serving, you must multiply the listed amount of sodium by the number of servings.  Choose foods with less than 140 mg of sodium per serving.  Avoid foods with 300 mg of sodium or more per serving. Shopping  Look for lower-sodium products, often labeled as "low-sodium" or "no salt added."  Always check the sodium content even if foods are labeled as "unsalted" or "no salt added".  Buy fresh foods. ? Avoid canned foods and premade or frozen meals. ? Avoid canned, cured, or processed meats  Buy breads that have less than 80 mg of sodium per slice. Cooking  Eat more home-cooked food and less restaurant, buffet, and fast food.  Avoid adding salt when cooking. Use salt-free seasonings or herbs instead of table salt or sea salt. Check with your health care provider or pharmacist before using salt substitutes.  Cook with plant-based oils, such as canola, sunflower, or olive oil. Meal planning  When eating at a restaurant, ask that your food  be prepared with less salt or no salt, if possible.  Avoid foods that contain MSG (monosodium glutamate). MSG is sometimes added to [place] food, bouillon, and some canned foods. What foods are recommended? The items listed may not be a complete list. Talk with your dietitian about what dietary choices are best for you. Grains Low-sodium cereals, including oats, puffed wheat and rice, and shredded wheat. Low-sodium crackers. Unsalted rice. Unsalted pasta. Low-sodium bread. Whole-grain breads and whole-grain pasta. Vegetables Fresh or frozen vegetables. "No salt added" canned vegetables. "No salt added" tomato sauce and paste. Low-sodium or reduced-sodium tomato and vegetable juice. Fruits Fresh, frozen, or canned fruit. Fruit juice. Meats and other protein foods Fresh or frozen (no salt added) meat, poultry, seafood, and fish. Low-sodium canned tuna and salmon. Unsalted nuts. Dried peas, beans, and lentils without added salt. Unsalted canned beans. Eggs. Unsalted nut butters. Dairy Milk. Soy milk. Cheese that is naturally low in sodium, such as ricotta cheese, fresh mozzarella, or Swiss cheese Low-sodium or reduced-sodium cheese. Cream cheese. Yogurt. Fats and oils Unsalted butter. Unsalted margarine with no trans fat. Vegetable oils such as canola or olive oils. Seasonings and other foods Fresh and dried herbs and spices. Salt-free seasonings. Low-sodium mustard and ketchup. Sodium-free salad dressing. Sodium-free light mayonnaise. Fresh or refrigerated horseradish. Lemon juice. Vinegar. Homemade, reduced-sodium, or low-sodium soups. Unsalted popcorn and pretzels. Low-salt or salt-free chips. What foods are not recommended? The items listed may not be a complete list. Talk with your dietitian about what dietary choices are  best for you. Grains Instant hot cereals. Bread stuffing, pancake, and biscuit mixes. Croutons. Seasoned rice or pasta mixes. Noodle soup cups. Boxed or frozen macaroni and  cheese. Regular salted crackers. Self-rising flour. Vegetables Sauerkraut, pickled vegetables, and relishes. Olives. [place] fries. Onion rings. Regular canned vegetables (not low-sodium or reduced-sodium). Regular canned tomato sauce and paste (not low-sodium or reduced-sodium). Regular tomato and vegetable juice (not low-sodium or reduced-sodium). Frozen vegetables in sauces. Meats and other protein foods Meat or fish that is salted, canned, smoked, spiced, or pickled. Bacon, ham, sausage, hotdogs, corned beef, chipped beef, packaged lunch meats, salt pork, jerky, pickled herring, anchovies, regular canned tuna, sardines, salted nuts. Dairy Processed cheese and cheese spreads. Cheese curds. Blue cheese. Feta cheese. String cheese. Regular cottage cheese. Buttermilk. Canned milk. Fats and oils Salted butter. Regular margarine. Ghee. Bacon fat. Seasonings and other foods Onion salt, garlic salt, seasoned salt, table salt, and sea salt. Canned and packaged gravies. Worcestershire sauce. Tartar sauce. Barbecue sauce. Teriyaki sauce. Soy sauce, including reduced-sodium. Steak sauce. Fish sauce. Oyster sauce. Cocktail sauce. Horseradish that you find on the shelf. Regular ketchup and mustard. Meat flavorings and tenderizers. Bouillon cubes. Hot sauce and Tabasco sauce. Premade or packaged marinades. Premade or packaged taco seasonings. Relishes. Regular salad dressings. Salsa. Potato and tortilla chips. Corn chips and puffs. Salted popcorn and pretzels. Canned or dried soups. Pizza. Frozen entrees and pot pies. Summary  Eating less sodium can help lower your blood pressure, reduce swelling, and protect your heart, liver, and kidneys.  Most people on this plan should limit their sodium intake to 1,500-2,000 mg (milligrams) of sodium each day.  Canned, boxed, and frozen foods are high in sodium. Restaurant foods, fast foods, and pizza are also very high in sodium. You also get sodium by adding salt to  food.  Try to cook at home, eat more fresh fruits and vegetables, and eat less fast food, canned, processed, or prepared foods. This information is not intended to replace advice given to you by your health care provider. Make sure you discuss any questions you have with your health care provider. Document Released: 02/12/2002 Document Revised: 08/16/2016 Document Reviewed: 08/16/2016 Elsevier Interactive Patient Education  [employer].

## 2018-03-13 DIAGNOSIS — I509 Heart failure, unspecified: Secondary | ICD-10-CM | POA: Diagnosis not present

## 2018-03-16 DIAGNOSIS — I420 Dilated cardiomyopathy: Secondary | ICD-10-CM | POA: Diagnosis not present

## 2018-03-30 DIAGNOSIS — I340 Nonrheumatic mitral (valve) insufficiency: Secondary | ICD-10-CM | POA: Diagnosis not present

## 2018-03-30 DIAGNOSIS — I509 Heart failure, unspecified: Secondary | ICD-10-CM | POA: Diagnosis not present

## 2018-03-30 DIAGNOSIS — M059 Rheumatoid arthritis with rheumatoid factor, unspecified: Secondary | ICD-10-CM | POA: Diagnosis not present

## 2018-03-30 DIAGNOSIS — E079 Disorder of thyroid, unspecified: Secondary | ICD-10-CM | POA: Diagnosis not present

## 2018-04-05 DIAGNOSIS — C439 Malignant melanoma of skin, unspecified: Secondary | ICD-10-CM | POA: Diagnosis not present

## 2018-04-05 DIAGNOSIS — M0579 Rheumatoid arthritis with rheumatoid factor of multiple sites without organ or systems involvement: Secondary | ICD-10-CM | POA: Diagnosis not present

## 2018-04-05 DIAGNOSIS — Z9289 Personal history of other medical treatment: Secondary | ICD-10-CM | POA: Diagnosis not present

## 2018-04-05 DIAGNOSIS — Z79899 Other long term (current) drug therapy: Secondary | ICD-10-CM | POA: Diagnosis not present

## 2018-04-05 DIAGNOSIS — C779 Secondary and unspecified malignant neoplasm of lymph node, unspecified: Secondary | ICD-10-CM | POA: Diagnosis not present

## 2018-04-05 DIAGNOSIS — C4372 Malignant melanoma of left lower limb, including hip: Secondary | ICD-10-CM | POA: Diagnosis not present

## 2018-04-05 DIAGNOSIS — E890 Postprocedural hypothyroidism: Secondary | ICD-10-CM | POA: Diagnosis not present

## 2018-04-06 DIAGNOSIS — L80 Vitiligo: Secondary | ICD-10-CM | POA: Diagnosis not present

## 2018-04-06 DIAGNOSIS — M0579 Rheumatoid arthritis with rheumatoid factor of multiple sites without organ or systems involvement: Secondary | ICD-10-CM | POA: Diagnosis not present

## 2018-04-06 DIAGNOSIS — E069 Thyroiditis, unspecified: Secondary | ICD-10-CM | POA: Diagnosis not present

## 2018-04-06 DIAGNOSIS — T887XXA Unspecified adverse effect of drug or medicament, initial encounter: Secondary | ICD-10-CM | POA: Diagnosis not present

## 2018-04-06 DIAGNOSIS — C439 Malignant melanoma of skin, unspecified: Secondary | ICD-10-CM | POA: Diagnosis not present

## 2018-04-06 DIAGNOSIS — F17210 Nicotine dependence, cigarettes, uncomplicated: Secondary | ICD-10-CM | POA: Diagnosis not present

## 2018-04-06 DIAGNOSIS — E559 Vitamin D deficiency, unspecified: Secondary | ICD-10-CM | POA: Diagnosis not present

## 2018-04-06 DIAGNOSIS — E890 Postprocedural hypothyroidism: Secondary | ICD-10-CM | POA: Diagnosis not present

## 2018-04-06 DIAGNOSIS — C779 Secondary and unspecified malignant neoplasm of lymph node, unspecified: Secondary | ICD-10-CM | POA: Diagnosis not present

## 2018-04-06 DIAGNOSIS — Z79899 Other long term (current) drug therapy: Secondary | ICD-10-CM | POA: Diagnosis not present

## 2018-04-06 DIAGNOSIS — E0500 Thyrotoxicosis with diffuse goiter without thyrotoxic crisis or storm: Secondary | ICD-10-CM | POA: Diagnosis not present

## 2018-04-10 ENCOUNTER — Ambulatory Visit: Payer: Self-pay | Admitting: Family

## 2018-06-01 DIAGNOSIS — I25810 Atherosclerosis of coronary artery bypass graft(s) without angina pectoris: Secondary | ICD-10-CM | POA: Diagnosis not present

## 2018-06-01 DIAGNOSIS — I2510 Atherosclerotic heart disease of native coronary artery without angina pectoris: Secondary | ICD-10-CM | POA: Diagnosis not present

## 2018-06-01 DIAGNOSIS — I10 Essential (primary) hypertension: Secondary | ICD-10-CM | POA: Diagnosis not present

## 2018-06-01 DIAGNOSIS — I509 Heart failure, unspecified: Secondary | ICD-10-CM | POA: Diagnosis not present

## 2018-07-26 DIAGNOSIS — C4372 Malignant melanoma of left lower limb, including hip: Secondary | ICD-10-CM | POA: Diagnosis not present

## 2018-07-26 DIAGNOSIS — C779 Secondary and unspecified malignant neoplasm of lymph node, unspecified: Secondary | ICD-10-CM | POA: Diagnosis not present

## 2018-07-26 DIAGNOSIS — C439 Malignant melanoma of skin, unspecified: Secondary | ICD-10-CM | POA: Diagnosis not present

## 2018-07-26 DIAGNOSIS — Z9289 Personal history of other medical treatment: Secondary | ICD-10-CM | POA: Diagnosis not present

## 2018-07-27 DIAGNOSIS — Z85820 Personal history of malignant melanoma of skin: Secondary | ICD-10-CM | POA: Diagnosis not present

## 2018-07-27 DIAGNOSIS — I5020 Unspecified systolic (congestive) heart failure: Secondary | ICD-10-CM | POA: Diagnosis not present

## 2018-07-27 DIAGNOSIS — E559 Vitamin D deficiency, unspecified: Secondary | ICD-10-CM | POA: Diagnosis not present

## 2018-07-27 DIAGNOSIS — C439 Malignant melanoma of skin, unspecified: Secondary | ICD-10-CM | POA: Diagnosis not present

## 2018-07-27 DIAGNOSIS — Z79899 Other long term (current) drug therapy: Secondary | ICD-10-CM | POA: Diagnosis not present

## 2018-07-27 DIAGNOSIS — F17210 Nicotine dependence, cigarettes, uncomplicated: Secondary | ICD-10-CM | POA: Diagnosis not present

## 2018-07-27 DIAGNOSIS — Z23 Encounter for immunization: Secondary | ICD-10-CM | POA: Diagnosis not present

## 2018-07-27 DIAGNOSIS — M0579 Rheumatoid arthritis with rheumatoid factor of multiple sites without organ or systems involvement: Secondary | ICD-10-CM | POA: Diagnosis not present

## 2018-08-01 DIAGNOSIS — R0602 Shortness of breath: Secondary | ICD-10-CM | POA: Diagnosis not present

## 2018-08-01 DIAGNOSIS — I509 Heart failure, unspecified: Secondary | ICD-10-CM | POA: Diagnosis not present

## 2018-08-01 DIAGNOSIS — I2510 Atherosclerotic heart disease of native coronary artery without angina pectoris: Secondary | ICD-10-CM | POA: Diagnosis not present

## 2018-08-01 DIAGNOSIS — I10 Essential (primary) hypertension: Secondary | ICD-10-CM | POA: Diagnosis not present

## 2018-08-01 DIAGNOSIS — I420 Dilated cardiomyopathy: Secondary | ICD-10-CM | POA: Diagnosis not present

## 2018-08-01 DIAGNOSIS — I340 Nonrheumatic mitral (valve) insufficiency: Secondary | ICD-10-CM | POA: Diagnosis not present

## 2018-08-27 IMAGING — CT CT ABD-PELV W/ CM
2 of 5 series · 14 of 46 positions shown, 16 images · IV contrast (APPLIED)
Comparison: CT scan of June 08, 2017.

CLINICAL DATA: Right upper quadrant abdominal pain.

EXAM:
CT ABDOMEN AND PELVIS WITH CONTRAST
TECHNIQUE: Multidetector CT imaging of the abdomen and pelvis was performed
using the standard protocol following bolus administration of
intravenous contrast.
CONTRAST:  100mL [id number] IOPAMIDOL ([id number]-WYY) INJECTION 61%

[Series 5: coronal st · coronal · 0.70mm/px · 3 of 100 slices shown]
[im 34/100  soft-tissue]
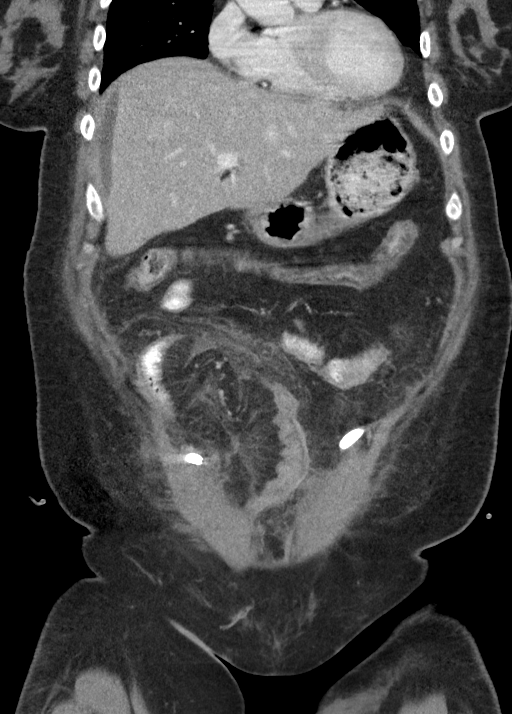
[im 45/100  soft-tissue]
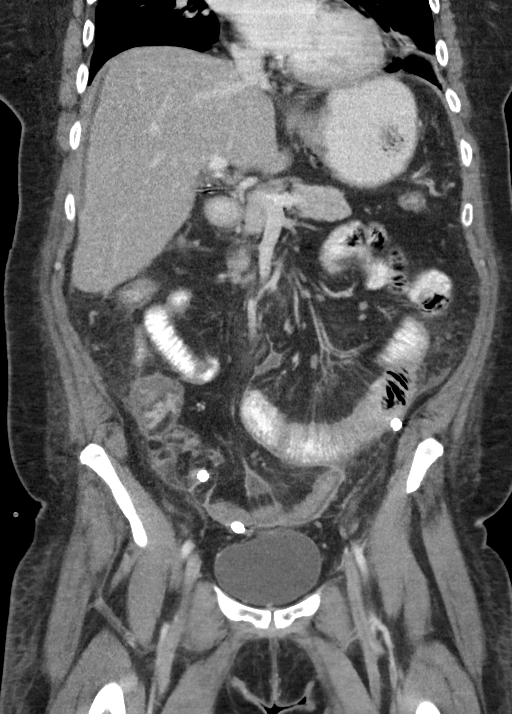
[im 56/100  soft-tissue]
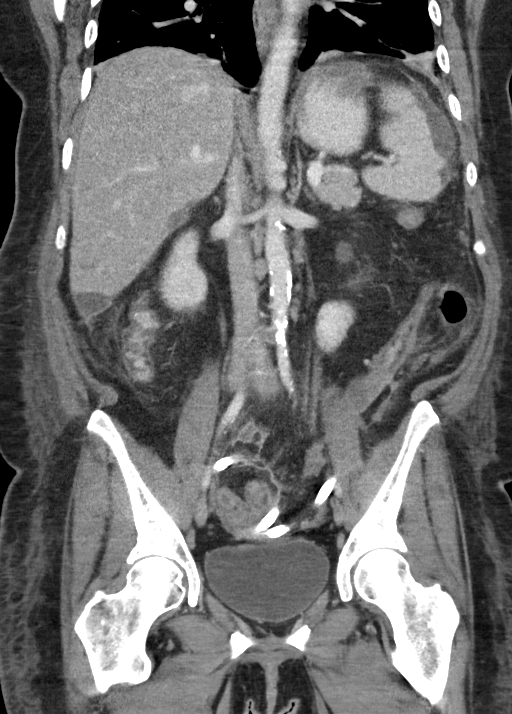

[Series 7: delay · axial · delayed · 0.88mm/px · z∈[-910,-480]mm · 11 of 102 slices shown, 13 images]
[im 8/102  soft-tissue]
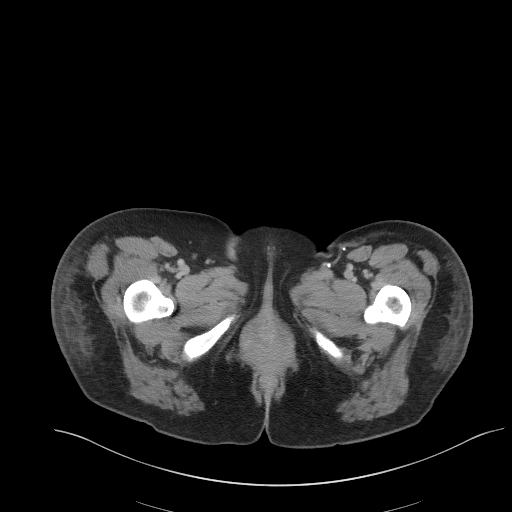
[im 8/102  bone]
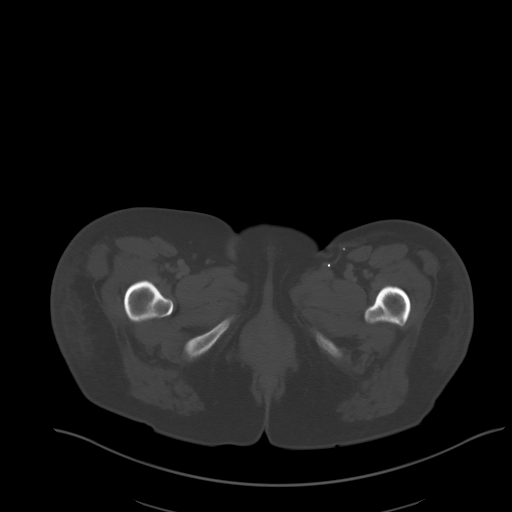
[im 15/102  soft-tissue]
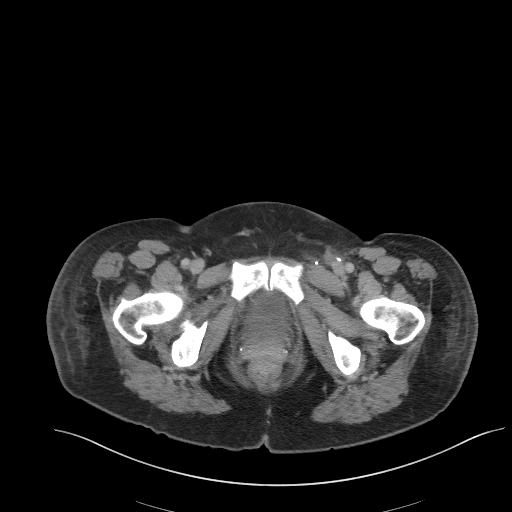
[im 22/102  soft-tissue]
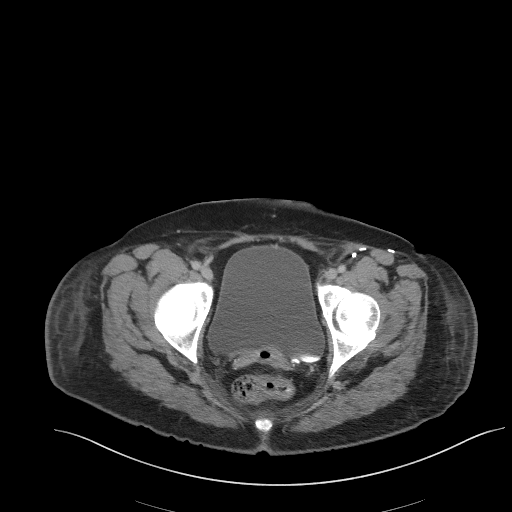
[im 37/102  soft-tissue]
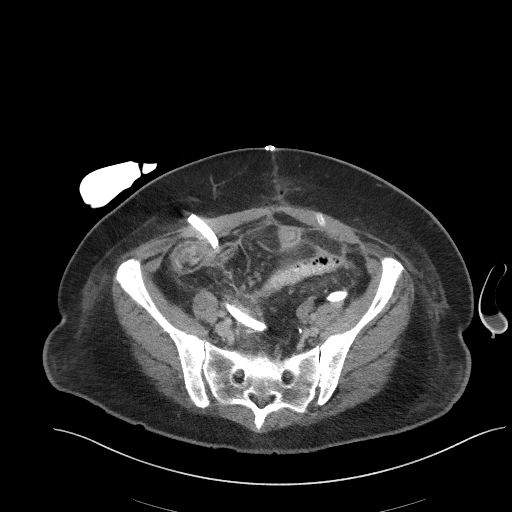
[im 44/102  soft-tissue]
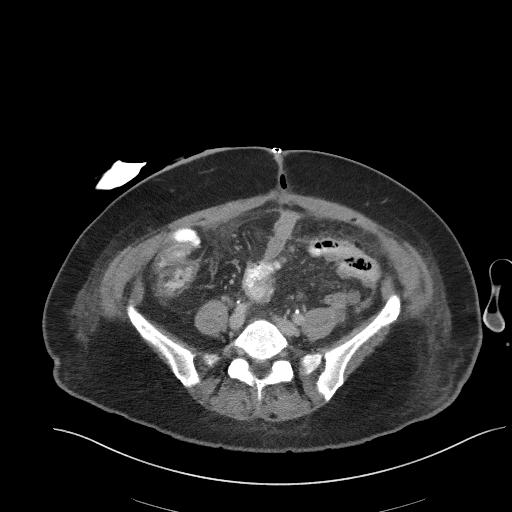
[im 51/102  soft-tissue]
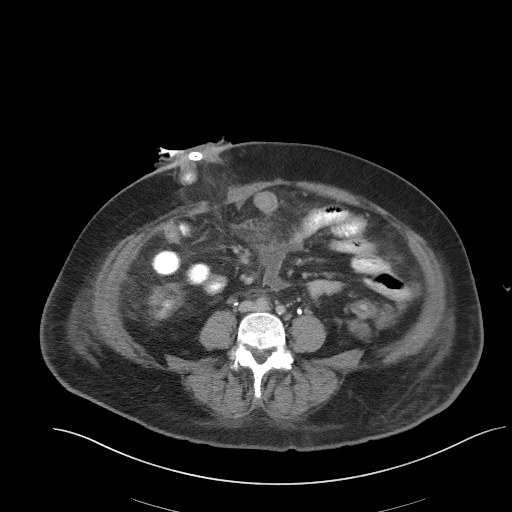
[im 58/102  soft-tissue]
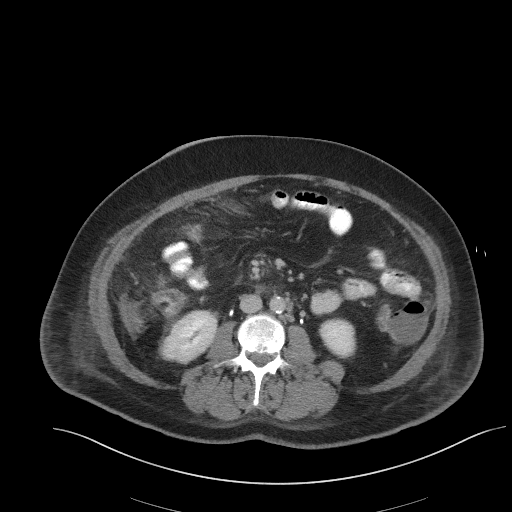
[im 65/102  soft-tissue]
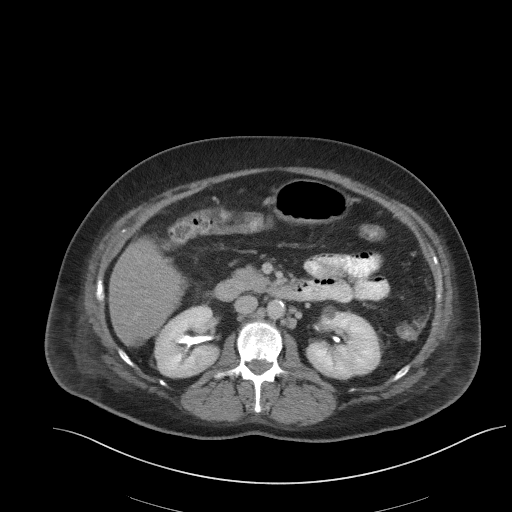
[im 80/102  soft-tissue]
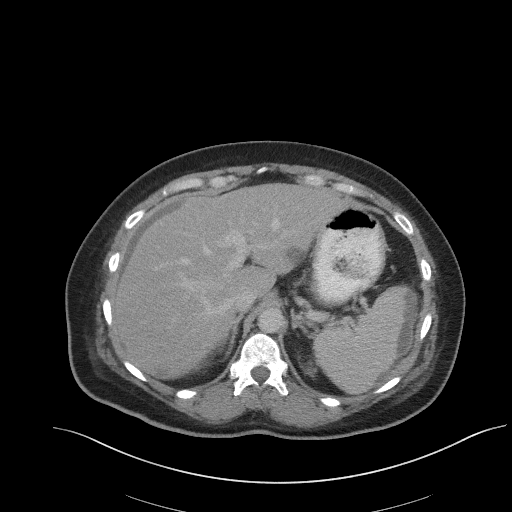
[im 80/102  bone]
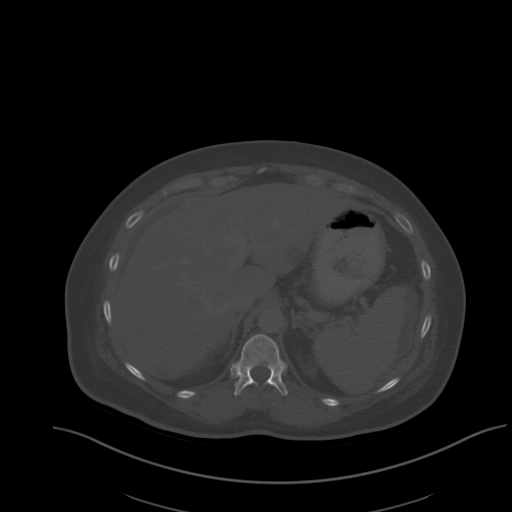
[im 87/102  soft-tissue]
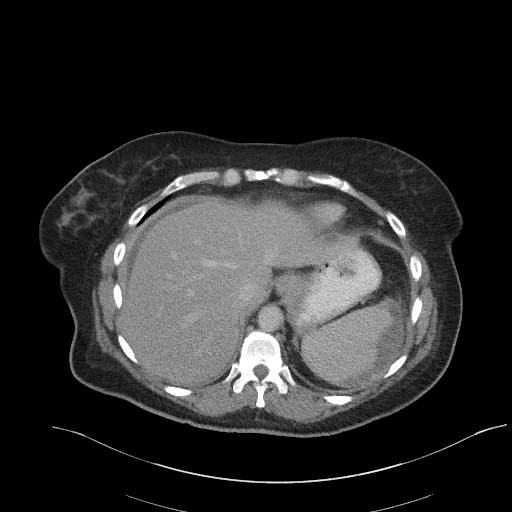
[im 94/102  soft-tissue]
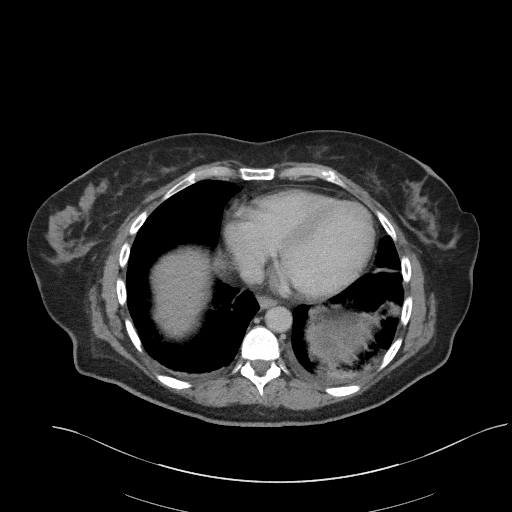

[14 of 46 positions shown; findings below may reference images not displayed]

FINDINGS: Lower chest: No acute abnormality.

Hepatobiliary: Status post cholecystectomy. Stable left hepatic cyst
is noted. There is interval development of subcapsular fluid
collection overlying lateral and anterior aspect of right hepatic
lobe, with several smaller subcapsular fluid collections along the
inferior and medial aspect of right hepatic lobe posteriorly. These
may represent small abscesses or old hematomas.

Pancreas: Unremarkable. No pancreatic ductal dilatation or
surrounding inflammatory changes.

Spleen: Interval development of subcapsular fluid collection along
the superior and lateral aspect of the spleen concerning for seroma
or possibly abscess.

Adrenals/Urinary Tract: Adrenal glands are unremarkable. Kidneys are
normal, without renal calculi, focal lesion, or hydronephrosis.
Bladder is unremarkable.

Stomach/Bowel: Status post placement of ileostomy in right lower
quadrant. Surgical anastomosis of distal sigmoid colon is noted ;
small amount of gas is seen around this anastomosis which may be
residual from prior surgery, although perforation cannot be
excluded. No abnormal bowel dilatation is noted. Mild wall
thickening of small bowel loops is noted anteriorly in the pelvis
suggesting inflammation. Stomach is unremarkable.

Vascular/Lymphatic: Aortic atherosclerosis. No enlarged abdominal or
pelvic lymph nodes.

Reproductive: Status post hysterectomy. No adnexal masses.

Other: 2 surgical drains are noted, both with tips in the anterior
portion of the pelvis. No fluid collection is noted around the tips
of these drains. There is interval development of 3.9 x 3.3 cm fluid
collection in left pericolic gutter concerning for abscess. 3.0 x
1.7 cm fluid collection is seen along left psoas muscle more
inferiorly.

Musculoskeletal: No acute or significant osseous findings.
IMPRESSION: Status post placement of ileostomy in right lower quadrant. There is
no evidence of bowel obstruction, although mild wall thickening is
seen involving small bowel loops anteriorly in the pelvis consistent
with inflammation.

Small amount of gas is seen in the fat surrounding surgical
anastomosis in distal sigmoid colon which may be residual from
previous surgery, but persistent perforation cannot be excluded.

Interval placement of 2 surgical drains in the pelvis, with no fluid
collection around the tip of either catheter.

Interval development of 3.9 x 3.3 cm fluid collection the left
pericolic gutter, as well as another 3.0 x 1.7 cm fluid collection
along left psoas muscle more inferiorly, concerning for small
abscesses.

Also noted are multiple subcapsular fluid collections involving the
liver and spleen which may represent seromas or old hematomas, but
abscesses cannot be excluded.

Aortic atherosclerosis.

## 2018-10-03 DIAGNOSIS — I509 Heart failure, unspecified: Secondary | ICD-10-CM | POA: Diagnosis not present

## 2018-10-03 DIAGNOSIS — I420 Dilated cardiomyopathy: Secondary | ICD-10-CM | POA: Diagnosis not present

## 2018-10-03 DIAGNOSIS — I340 Nonrheumatic mitral (valve) insufficiency: Secondary | ICD-10-CM | POA: Diagnosis not present

## 2018-10-03 DIAGNOSIS — I10 Essential (primary) hypertension: Secondary | ICD-10-CM | POA: Diagnosis not present

## 2018-10-03 DIAGNOSIS — I2510 Atherosclerotic heart disease of native coronary artery without angina pectoris: Secondary | ICD-10-CM | POA: Diagnosis not present

## 2018-10-03 DIAGNOSIS — R0602 Shortness of breath: Secondary | ICD-10-CM | POA: Diagnosis not present

## 2018-11-02 DIAGNOSIS — C779 Secondary and unspecified malignant neoplasm of lymph node, unspecified: Secondary | ICD-10-CM | POA: Diagnosis not present

## 2018-11-02 DIAGNOSIS — M0579 Rheumatoid arthritis with rheumatoid factor of multiple sites without organ or systems involvement: Secondary | ICD-10-CM | POA: Diagnosis not present

## 2018-11-02 DIAGNOSIS — Z23 Encounter for immunization: Secondary | ICD-10-CM | POA: Diagnosis not present

## 2018-11-02 DIAGNOSIS — E890 Postprocedural hypothyroidism: Secondary | ICD-10-CM | POA: Diagnosis not present

## 2018-11-02 DIAGNOSIS — Z79899 Other long term (current) drug therapy: Secondary | ICD-10-CM | POA: Diagnosis not present

## 2018-11-02 DIAGNOSIS — E559 Vitamin D deficiency, unspecified: Secondary | ICD-10-CM | POA: Diagnosis not present

## 2018-11-02 DIAGNOSIS — C439 Malignant melanoma of skin, unspecified: Secondary | ICD-10-CM | POA: Diagnosis not present

## 2018-11-22 DIAGNOSIS — Z9289 Personal history of other medical treatment: Secondary | ICD-10-CM | POA: Diagnosis not present

## 2018-11-22 DIAGNOSIS — C779 Secondary and unspecified malignant neoplasm of lymph node, unspecified: Secondary | ICD-10-CM | POA: Diagnosis not present

## 2018-11-22 DIAGNOSIS — R918 Other nonspecific abnormal finding of lung field: Secondary | ICD-10-CM | POA: Diagnosis not present

## 2018-11-22 DIAGNOSIS — C439 Malignant melanoma of skin, unspecified: Secondary | ICD-10-CM | POA: Diagnosis not present

## 2018-11-22 DIAGNOSIS — C4372 Malignant melanoma of left lower limb, including hip: Secondary | ICD-10-CM | POA: Diagnosis not present

## 2019-03-05 DIAGNOSIS — I509 Heart failure, unspecified: Secondary | ICD-10-CM | POA: Diagnosis not present

## 2019-03-05 DIAGNOSIS — E568 Deficiency of other vitamins: Secondary | ICD-10-CM | POA: Diagnosis not present

## 2019-03-05 DIAGNOSIS — I2510 Atherosclerotic heart disease of native coronary artery without angina pectoris: Secondary | ICD-10-CM | POA: Diagnosis not present

## 2019-03-05 DIAGNOSIS — I340 Nonrheumatic mitral (valve) insufficiency: Secondary | ICD-10-CM | POA: Diagnosis not present

## 2019-03-28 DIAGNOSIS — C779 Secondary and unspecified malignant neoplasm of lymph node, unspecified: Secondary | ICD-10-CM | POA: Diagnosis not present

## 2019-03-28 DIAGNOSIS — R918 Other nonspecific abnormal finding of lung field: Secondary | ICD-10-CM | POA: Diagnosis not present

## 2019-03-28 DIAGNOSIS — C439 Malignant melanoma of skin, unspecified: Secondary | ICD-10-CM | POA: Diagnosis not present

## 2019-03-28 DIAGNOSIS — C4372 Malignant melanoma of left lower limb, including hip: Secondary | ICD-10-CM | POA: Diagnosis not present

## 2019-03-28 DIAGNOSIS — Z9289 Personal history of other medical treatment: Secondary | ICD-10-CM | POA: Diagnosis not present

## 2019-08-17 DIAGNOSIS — T887XXA Unspecified adverse effect of drug or medicament, initial encounter: Secondary | ICD-10-CM | POA: Diagnosis not present

## 2019-08-17 DIAGNOSIS — Z79899 Other long term (current) drug therapy: Secondary | ICD-10-CM | POA: Diagnosis not present

## 2019-08-17 DIAGNOSIS — M0579 Rheumatoid arthritis with rheumatoid factor of multiple sites without organ or systems involvement: Secondary | ICD-10-CM | POA: Diagnosis not present

## 2019-08-17 DIAGNOSIS — C439 Malignant melanoma of skin, unspecified: Secondary | ICD-10-CM | POA: Diagnosis not present

## 2019-08-29 DIAGNOSIS — C779 Secondary and unspecified malignant neoplasm of lymph node, unspecified: Secondary | ICD-10-CM | POA: Diagnosis not present

## 2019-08-29 DIAGNOSIS — R918 Other nonspecific abnormal finding of lung field: Secondary | ICD-10-CM | POA: Diagnosis not present

## 2019-08-29 DIAGNOSIS — C4372 Malignant melanoma of left lower limb, including hip: Secondary | ICD-10-CM | POA: Diagnosis not present

## 2019-08-29 DIAGNOSIS — E890 Postprocedural hypothyroidism: Secondary | ICD-10-CM | POA: Diagnosis not present

## 2019-08-29 DIAGNOSIS — E559 Vitamin D deficiency, unspecified: Secondary | ICD-10-CM | POA: Diagnosis not present

## 2019-08-29 DIAGNOSIS — M0579 Rheumatoid arthritis with rheumatoid factor of multiple sites without organ or systems involvement: Secondary | ICD-10-CM | POA: Diagnosis not present

## 2019-08-29 DIAGNOSIS — C439 Malignant melanoma of skin, unspecified: Secondary | ICD-10-CM | POA: Diagnosis not present

## 2019-09-04 DIAGNOSIS — I2510 Atherosclerotic heart disease of native coronary artery without angina pectoris: Secondary | ICD-10-CM | POA: Diagnosis not present

## 2019-09-04 DIAGNOSIS — I509 Heart failure, unspecified: Secondary | ICD-10-CM | POA: Diagnosis not present

## 2019-09-04 DIAGNOSIS — I10 Essential (primary) hypertension: Secondary | ICD-10-CM | POA: Diagnosis not present

## 2019-09-04 DIAGNOSIS — I420 Dilated cardiomyopathy: Secondary | ICD-10-CM | POA: Diagnosis not present

## 2019-09-04 DIAGNOSIS — R0602 Shortness of breath: Secondary | ICD-10-CM | POA: Diagnosis not present

## 2019-09-04 DIAGNOSIS — E568 Deficiency of other vitamins: Secondary | ICD-10-CM | POA: Diagnosis not present

## 2019-09-04 DIAGNOSIS — I340 Nonrheumatic mitral (valve) insufficiency: Secondary | ICD-10-CM | POA: Diagnosis not present

## 2019-09-04 DIAGNOSIS — I351 Nonrheumatic aortic (valve) insufficiency: Secondary | ICD-10-CM | POA: Diagnosis not present

## 2019-10-11 DIAGNOSIS — I509 Heart failure, unspecified: Secondary | ICD-10-CM | POA: Diagnosis not present

## 2019-10-11 DIAGNOSIS — I10 Essential (primary) hypertension: Secondary | ICD-10-CM | POA: Diagnosis not present

## 2019-10-11 DIAGNOSIS — E669 Obesity, unspecified: Secondary | ICD-10-CM | POA: Diagnosis not present

## 2019-10-11 DIAGNOSIS — R0602 Shortness of breath: Secondary | ICD-10-CM | POA: Diagnosis not present

## 2019-10-11 DIAGNOSIS — I420 Dilated cardiomyopathy: Secondary | ICD-10-CM | POA: Diagnosis not present

## 2019-10-11 DIAGNOSIS — I2510 Atherosclerotic heart disease of native coronary artery without angina pectoris: Secondary | ICD-10-CM | POA: Diagnosis not present

## 2019-10-11 DIAGNOSIS — I340 Nonrheumatic mitral (valve) insufficiency: Secondary | ICD-10-CM | POA: Diagnosis not present

## 2019-12-11 DIAGNOSIS — Z9289 Personal history of other medical treatment: Secondary | ICD-10-CM | POA: Diagnosis not present

## 2019-12-11 DIAGNOSIS — E890 Postprocedural hypothyroidism: Secondary | ICD-10-CM | POA: Diagnosis not present

## 2019-12-11 DIAGNOSIS — E559 Vitamin D deficiency, unspecified: Secondary | ICD-10-CM | POA: Diagnosis not present

## 2020-01-15 DIAGNOSIS — I340 Nonrheumatic mitral (valve) insufficiency: Secondary | ICD-10-CM | POA: Diagnosis not present

## 2020-01-15 DIAGNOSIS — I429 Cardiomyopathy, unspecified: Secondary | ICD-10-CM | POA: Diagnosis not present

## 2020-01-15 DIAGNOSIS — F17210 Nicotine dependence, cigarettes, uncomplicated: Secondary | ICD-10-CM | POA: Diagnosis not present

## 2020-01-15 DIAGNOSIS — I10 Essential (primary) hypertension: Secondary | ICD-10-CM | POA: Diagnosis not present

## 2020-01-15 DIAGNOSIS — I351 Nonrheumatic aortic (valve) insufficiency: Secondary | ICD-10-CM | POA: Diagnosis not present

## 2020-01-15 DIAGNOSIS — I509 Heart failure, unspecified: Secondary | ICD-10-CM | POA: Diagnosis not present

## 2020-01-15 DIAGNOSIS — F17200 Nicotine dependence, unspecified, uncomplicated: Secondary | ICD-10-CM | POA: Diagnosis not present

## 2020-03-11 DIAGNOSIS — E559 Vitamin D deficiency, unspecified: Secondary | ICD-10-CM | POA: Diagnosis not present

## 2020-03-11 DIAGNOSIS — R918 Other nonspecific abnormal finding of lung field: Secondary | ICD-10-CM | POA: Diagnosis not present

## 2020-03-11 DIAGNOSIS — C779 Secondary and unspecified malignant neoplasm of lymph node, unspecified: Secondary | ICD-10-CM | POA: Diagnosis not present

## 2020-03-11 DIAGNOSIS — M0579 Rheumatoid arthritis with rheumatoid factor of multiple sites without organ or systems involvement: Secondary | ICD-10-CM | POA: Diagnosis not present

## 2020-03-11 DIAGNOSIS — Z9289 Personal history of other medical treatment: Secondary | ICD-10-CM | POA: Diagnosis not present

## 2020-03-11 DIAGNOSIS — C4372 Malignant melanoma of left lower limb, including hip: Secondary | ICD-10-CM | POA: Diagnosis not present

## 2020-03-11 DIAGNOSIS — C439 Malignant melanoma of skin, unspecified: Secondary | ICD-10-CM | POA: Diagnosis not present

## 2020-04-11 DIAGNOSIS — I509 Heart failure, unspecified: Secondary | ICD-10-CM | POA: Diagnosis not present

## 2020-04-16 DIAGNOSIS — F17210 Nicotine dependence, cigarettes, uncomplicated: Secondary | ICD-10-CM | POA: Diagnosis not present

## 2020-04-16 DIAGNOSIS — I10 Essential (primary) hypertension: Secondary | ICD-10-CM | POA: Diagnosis not present

## 2020-04-16 DIAGNOSIS — I509 Heart failure, unspecified: Secondary | ICD-10-CM | POA: Diagnosis not present

## 2020-04-16 DIAGNOSIS — R609 Edema, unspecified: Secondary | ICD-10-CM | POA: Diagnosis not present

## 2020-04-16 DIAGNOSIS — E785 Hyperlipidemia, unspecified: Secondary | ICD-10-CM | POA: Diagnosis not present

## 2022-10-15 ENCOUNTER — Other Ambulatory Visit: Payer: Self-pay | Admitting: Cardiovascular Disease

## 2022-10-19 ENCOUNTER — Encounter: Payer: Self-pay | Admitting: Cardiovascular Disease

## 2022-10-19 ENCOUNTER — Ambulatory Visit: Payer: BC Managed Care – PPO | Admitting: Cardiovascular Disease

## 2022-10-19 VITALS — BP 128/72 | HR 64 | Ht 65.0 in | Wt 198.0 lb

## 2022-10-19 DIAGNOSIS — E782 Mixed hyperlipidemia: Secondary | ICD-10-CM | POA: Diagnosis not present

## 2022-10-19 DIAGNOSIS — Z720 Tobacco use: Secondary | ICD-10-CM | POA: Diagnosis not present

## 2022-10-19 DIAGNOSIS — I5022 Chronic systolic (congestive) heart failure: Secondary | ICD-10-CM | POA: Diagnosis not present

## 2022-10-19 DIAGNOSIS — I10 Essential (primary) hypertension: Secondary | ICD-10-CM | POA: Diagnosis not present

## 2022-10-19 MED ORDER — CARVEDILOL 12.5 MG PO TABS: 12.5000 mg | ORAL_TABLET | Freq: Two times a day (BID) | ORAL | 1 refills | Status: DC

## 2022-10-19 MED ORDER — SPIRONOLACTONE 25 MG PO TABS: 12.5000 mg | ORAL_TABLET | Freq: Every day | ORAL | 1 refills | Status: DC

## 2022-10-19 MED ORDER — ROSUVASTATIN CALCIUM 20 MG PO TABS: 20.0000 mg | ORAL_TABLET | Freq: Every day | ORAL | 1 refills | Status: DC

## 2022-10-19 MED ORDER — SACUBITRIL-VALSARTAN 24-26 MG PO TABS: 1.0000 | ORAL_TABLET | Freq: Two times a day (BID) | ORAL | 1 refills | Status: DC

## 2022-10-19 NOTE — Assessment & Plan Note (Addendum)
Patient feeling well. No complaints. Continue Entresto, Spironolactone, Coreg.  02/2018 Echo and Cath done at [facility]: LVEF 25-30%, Dilated LV, RA, RV and LA, with severe diffuse hypokinesis and moderate-severe MR.   Repeat echo in 6 months.

## 2022-10-19 NOTE — Assessment & Plan Note (Signed)
Smokes 2 cigarettes daily, not wanting to quit

## 2022-10-19 NOTE — Assessment & Plan Note (Signed)
Continue rosuvastatin 20 mg.

## 2022-10-19 NOTE — Progress Notes (Signed)
Cardiology Office Note   Date:  10/19/2022   ID:  Rebecca Obrien, DOB 07/30/1966, MRN [id number]  PCP:  Marguerita Merles, MD  Cardiologist:  Neoma Laming, MD      History of Present Illness: Rebecca Obrien is a 57 y.o. female who presents for  Chief Complaint  Patient presents with   Follow-up    Follow Up    HPI    Past Medical History:  Diagnosis Date   Anxiety    Arthritis    RA   Cancer ([place])    skin melanoma   CHF (congestive heart failure) ([place]) 02/09/2018   Collagen vascular disease ([place])    RA   Complication of anesthesia    difficult intubation   Depression    Difficult airway    Difficult intubation    Very anterior airway. 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   History of kidney stones 1994   x 1   Hyperlipidemia    Hypertension    history of   Hyperthyroidism    Hypokalemia    Large intestine anastomotic leak 06/09/2017   Obesity    Rheumatoid arthritis(714.0)    Stroke ([place]) 2013   no residual     Past Surgical History:  Procedure Laterality Date   ABDOMINAL HYSTERECTOMY     CARDIAC CATHETERIZATION     CARPAL TUNNEL RELEASE  Bilateral   CHOLECYSTECTOMY     CYSTOSCOPY WITH STENT PLACEMENT Bilateral 05/31/2017   Procedure: CYSTOSCOPY WITH STENT PLACEMENT- LIGHTED STENTS;  Surgeon: Hollice Espy, MD;  Location: [facility];  Service: Urology;  Laterality: Bilateral;   EXCISION MELANOMA WITH SENTINEL LYMPH NODE BIOPSY Left 12/2016   Left calf with lymph nodes removed in left groin.   ILEOSTOMY N/A 06/08/2017   Procedure: ILEOSTOMY;  Surgeon: Vickie Epley, MD;  Location: [place] [facility];  Service: General;  Laterality: N/A;   ILEOSTOMY CLOSURE N/A 09/15/2017   Procedure: LOOP ILEOSTOMY TAKEDOWN;  Surgeon: Clayburn Pert, MD;  Location: [facility];  Service: General;  Laterality: N/A;   LAPAROSCOPIC SIGMOID COLECTOMY N/A 05/31/2017    Procedure: LAPAROSCOPIC SIGMOID COLECTOMY;  Surgeon: Clayburn Pert, MD;  Location: [facility];  Service: General;  Laterality: N/A;   LAPAROTOMY N/A 06/08/2017   Procedure: EXPLORATORY LAPAROTOMY;  Surgeon: Vickie Epley, MD;  Location: [facility];  Service: General;  Laterality: N/A;   LEFT HEART CATH AND CORONARY ANGIOGRAPHY N/A 02/10/2018   Procedure: LEFT HEART CATH AND CORONARY ANGIOGRAPHY;  Surgeon: Dionisio David, MD;  Location: [place] [facility];  Service: Cardiovascular;  Laterality: N/A;   TEE WITHOUT CARDIOVERSION  07/26/2012   Procedure: TRANSESOPHAGEAL ECHOCARDIOGRAM (TEE);  Surgeon: Thayer Headings, MD;  Location: [facility] ENDOSCOPY;  Service: Cardiovascular;  Laterality: N/A;   TONSILLECTOMY     TOTAL THYROIDECTOMY       Current Outpatient Medications  Medication Sig Dispense Refill   aspirin EC 81 MG EC tablet Take 1 tablet (81 mg total) by mouth daily. 30 tablet 1   carvedilol (COREG) 12.5 MG tablet Take 12.5 mg by mouth 2 (two) times daily with a meal.     leflunomide (ARAVA) 20 MG tablet Take 20 mg by mouth daily.  1   levothyroxine (SYNTHROID, LEVOTHROID) 112 MCG tablet Take 112 mcg by mouth daily before breakfast.     sacubitril-valsartan (ENTRESTO) 24-26 MG Take 1 tablet by  mouth 2 (two) times daily. 60 tablet 1   spironolactone (ALDACTONE) 25 MG tablet Take 0.5 tablets (12.5 mg total) by mouth daily. 30 tablet 1   rosuvastatin (CRESTOR) 20 MG tablet Take 20 mg by mouth at bedtime.     No current facility-administered medications for this visit.    Allergies:   Tape    Social History:   reports that she has been smoking cigarettes. She has a 2.30 pack-year smoking history. She has never used smokeless tobacco. She reports that she does not drink alcohol and does not use drugs.   Family History:  family history includes Cardiomyopathy in her brother; Heart failure in her father; Hypertension in her father and mother; Nephrolithiasis in her mother.    ROS:      Review of Systems  Constitutional: Negative.   HENT: Negative.    Eyes: Negative.   Respiratory: Negative.    Gastrointestinal: Negative.   Genitourinary: Negative.   Musculoskeletal: Negative.   Skin: Negative.   Neurological: Negative.   Endo/Heme/Allergies: Negative.   Psychiatric/Behavioral: Negative.    All other systems reviewed and are negative.     All other systems are reviewed and negative.    PHYSICAL EXAM: VS:  BP 128/72   Pulse 64   Ht 5' 5"$  (1.651 m)   Wt 198 lb (89.8 kg)   SpO2 94%   BMI 32.95 kg/m  , BMI Body mass index is 32.95 kg/m. Last weight:  Wt Readings from Last 3 Encounters:  10/19/22 198 lb (89.8 kg)  07/26/17 140 lb (63.5 kg)  07/26/17 140 lb (63.5 kg)     Physical Exam Constitutional:      Appearance: Normal appearance.  Cardiovascular:     Rate and Rhythm: Normal rate and regular rhythm.     Heart sounds: Normal heart sounds.  Pulmonary:     Effort: Pulmonary effort is normal.     Breath sounds: Normal breath sounds.  Musculoskeletal:     Right lower leg: No edema.     Left lower leg: No edema.  Neurological:     Mental Status: She is alert.       EKG:   Recent Labs: No results found for requested labs within last 365 days.    Lipid Panel    Component Value Date/Time   CHOL 190 07/24/2012 0435   TRIG 122 07/24/2012 0435   HDL 34 (L) 07/24/2012 0435   CHOLHDL 5.6 07/24/2012 0435   VLDL 24 07/24/2012 0435   LDLCALC 132 (H) 07/24/2012 0435      Other studies Reviewed: Additional studies/ records that were reviewed today include:  Review of the above records demonstrates:       No data to display            ASSESSMENT AND PLAN:  No diagnosis found.   Problem List Items Addressed This Visit   None      Disposition:   No follow-ups on file.     Signed,  Neoma Laming, MD  10/19/2022 9:21 [place]

## 2022-10-19 NOTE — Progress Notes (Signed)
Cardiology Office Note   Date:  10/19/2022   ID:  Rebecca Obrien, DOB August 11, 1966, MRN [id number]  PCP:  Marguerita Merles, MD  Cardiologist:  Neoma Laming, MD      History of Present Illness: Rebecca Obrien is a 57 y.o. female who presents for  Chief Complaint  Patient presents with   Follow-up    Follow Up    Patient in office for routine cardiac exam. Denies chest pain, shortness of breath, edema, palpitations.       Past Medical History:  Diagnosis Date   Anxiety    Arthritis    RA   Cancer ([place])    skin melanoma   CHF (congestive heart failure) ([place]) 02/09/2018   Collagen vascular disease ([facility])    RA   Complication of anesthesia    difficult intubation   Depression    Difficult airway    Difficult intubation    Very anterior airway. 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   History of kidney stones 1994   x 1   Hyperlipidemia    Hypertension    history of   Hyperthyroidism    Hypokalemia    Large intestine anastomotic leak 06/09/2017   Obesity    Rheumatoid arthritis(714.0)    Stroke ([place]) 2013   no residual     Past Surgical History:  Procedure Laterality Date   ABDOMINAL HYSTERECTOMY     CARDIAC CATHETERIZATION     CARPAL TUNNEL RELEASE  Bilateral   CHOLECYSTECTOMY     CYSTOSCOPY WITH STENT PLACEMENT Bilateral 05/31/2017   Procedure: CYSTOSCOPY WITH STENT PLACEMENT- LIGHTED STENTS;  Surgeon: Hollice Espy, MD;  Location: [facility];  Service: Urology;  Laterality: Bilateral;   EXCISION MELANOMA WITH SENTINEL LYMPH NODE BIOPSY Left 12/2016   Left calf with lymph nodes removed in left groin.   ILEOSTOMY N/A 06/08/2017   Procedure: ILEOSTOMY;  Surgeon: Vickie Epley, MD;  Location: [place] [facility];  Service: General;  Laterality: N/A;   ILEOSTOMY CLOSURE N/A 09/15/2017   Procedure: LOOP ILEOSTOMY TAKEDOWN;  Surgeon: Clayburn Pert, MD;  Location:  [facility];  Service: General;  Laterality: N/A;   LAPAROSCOPIC SIGMOID COLECTOMY N/A 05/31/2017   Procedure: LAPAROSCOPIC SIGMOID COLECTOMY;  Surgeon: Clayburn Pert, MD;  Location: [facility];  Service: General;  Laterality: N/A;   LAPAROTOMY N/A 06/08/2017   Procedure: EXPLORATORY LAPAROTOMY;  Surgeon: Vickie Epley, MD;  Location: [facility];  Service: General;  Laterality: N/A;   LEFT HEART CATH AND CORONARY ANGIOGRAPHY N/A 02/10/2018   Procedure: LEFT HEART CATH AND CORONARY ANGIOGRAPHY;  Surgeon: Dionisio David, MD;  Location: [place] [facility];  Service: Cardiovascular;  Laterality: N/A;   TEE WITHOUT CARDIOVERSION  07/26/2012   Procedure: TRANSESOPHAGEAL ECHOCARDIOGRAM (TEE);  Surgeon: Thayer Headings, MD;  Location: [facility];  Service: Cardiovascular;  Laterality: N/A;   TONSILLECTOMY     TOTAL THYROIDECTOMY       Current Outpatient Medications  Medication Sig Dispense Refill   aspirin EC 81 MG EC tablet Take 1 tablet (81 mg total) by mouth daily. 30 tablet 1   leflunomide (ARAVA) 20 MG tablet Take 20 mg by mouth daily.  1   levothyroxine (SYNTHROID, LEVOTHROID) 112 MCG tablet Take 112 mcg by mouth daily before breakfast.     carvedilol (COREG) 12.5 MG tablet Take 1 tablet (12.5 mg total) by  mouth 2 (two) times daily with a meal. 180 tablet 1   rosuvastatin (CRESTOR) 20 MG tablet Take 1 tablet (20 mg total) by mouth at bedtime. 90 tablet 1   sacubitril-valsartan (ENTRESTO) 24-26 MG Take 1 tablet by mouth 2 (two) times daily. 180 tablet 1   spironolactone (ALDACTONE) 25 MG tablet Take 0.5 tablets (12.5 mg total) by mouth daily. 45 tablet 1   No current facility-administered medications for this visit.    Allergies:   Tape    Social History:   reports that she has been smoking cigarettes. She has a 2.30 pack-year smoking history. She has never used smokeless tobacco. She reports that she does not drink alcohol and does not use drugs.   Family History:  family history  includes Cardiomyopathy in her brother; Heart failure in her father; Hypertension in her father and mother; Nephrolithiasis in her mother.    ROS:     Review of Systems  Constitutional: Negative.   HENT: Negative.    Eyes: Negative.   Respiratory: Negative.    Gastrointestinal: Negative.   Genitourinary: Negative.   Musculoskeletal: Negative.   Skin: Negative.   Neurological: Negative.   Endo/Heme/Allergies: Negative.   Psychiatric/Behavioral: Negative.    All other systems reviewed and are negative.     All other systems are reviewed and negative.    PHYSICAL EXAM: VS:  BP 128/72   Pulse 64   Ht 5' 5"$  (1.651 m)   Wt 198 lb (89.8 kg)   SpO2 94%   BMI 32.95 kg/m  , BMI Body mass index is 32.95 kg/m. Last weight:  Wt Readings from Last 3 Encounters:  10/19/22 198 lb (89.8 kg)  07/26/17 140 lb (63.5 kg)  07/26/17 140 lb (63.5 kg)     Physical Exam Constitutional:      Appearance: Normal appearance.  Cardiovascular:     Rate and Rhythm: Normal rate and regular rhythm.     Heart sounds: Normal heart sounds.  Pulmonary:     Effort: Pulmonary effort is normal.     Breath sounds: Normal breath sounds.  Musculoskeletal:     Right lower leg: No edema.     Left lower leg: No edema.  Neurological:     Mental Status: She is alert.       EKG: none today  Recent Labs: No results found for requested labs within last 365 days.    Lipid Panel    Component Value Date/Time   CHOL 190 07/24/2012 0435   TRIG 122 07/24/2012 0435   HDL 34 (L) 07/24/2012 0435   CHOLHDL 5.6 07/24/2012 0435   VLDL 24 07/24/2012 0435   LDLCALC 132 (H) 07/24/2012 0435     Other studies Reviewed:  REASON FOR VISIT  Visit for: Echocardiogram/I 23.9  Sex:   Female   wt= 203   lbs.  BP=126/74  Height= 64   inches.        TESTS  Imaging: Echocardiogram:  An echocardiogram in (2-d) mode was performed and in Doppler mode with color flow velocity mapping was performed. The aortic  valve cusps are abnormal 1.3   cm, flow velocity .993   m/s, and systolic calculated mean flow gradient 2   mmHg. Mitral valve diastolic peak flow velocity E .681    m/s and E/A ratio 0.9. Aortic root diameter 2.9   cm. The LVOT internal diameter 3.1  cm and flow velocity was abnormal .[id number]   m/s. LV systolic dimension [id number]  cm, diastolic  3.85  cm, posterior wall thickness 1.18   cm, fractional shortening 41.6  %, and EF 73.2 %. IVS thickness 1.58  cm. LA dimension 4.6  cm. Mitral Valve has Trace Regurgitation. Aortic Valve has Trace Regurgitation. Tricuspid Valve has Trace Regurgitation.     ASSESSMENT  Technically adequate study.  Normal chamber sizes.  Normal left ventricular systolic function.  Mild left ventricular hypertrophy with GRADE 1 (relaxation abnormality) diastolic dysfunction.  Normal right ventricular systolic function.  Normal right ventricular diastolic function.  Normal left ventricular wall motion.  Normal right ventricular wall motion.  Trace tricuspid regurgitation..  Normal pulmonary artery pressure.  Trace mitral regurgitation.  Trace aortic regurgitation.  No pericardial effusion.  Mild LVH.     THERAPY   Referring physician: Dionisio David  Sonographer: Neoma Laming.    Neoma Laming MD  Electronically signed by: Neoma Laming     Date: 09/28/2021 13:44   ASSESSMENT AND PLAN:    ICD-10-CM   1. Heart failure with improved ejection fraction (HFimpEF) (H[facility])  I50.22 PCV ECHOCARDIOGRAM COMPLETE    carvedilol (COREG) 12.5 MG tablet    rosuvastatin (CRESTOR) 20 MG tablet    sacubitril-valsartan (ENTRESTO) 24-26 MG    spironolactone (ALDACTONE) 25 MG tablet    2. Primary hypertension  I10 PCV ECHOCARDIOGRAM COMPLETE    3. Tobacco abuse  Z72.0     4. Mixed hyperlipidemia  E78.2        Problem List Items Addressed This Visit       Cardiovascular and Mediastinum   Hypertension   Relevant Medications   carvedilol (COREG) 12.5 MG tablet    rosuvastatin (CRESTOR) 20 MG tablet   sacubitril-valsartan (ENTRESTO) 24-26 MG   spironolactone (ALDACTONE) 25 MG tablet   Other Relevant Orders   PCV ECHOCARDIOGRAM COMPLETE   Heart failure with improved ejection fraction (HFimpEF) ([place]) - Primary    Patient feeling well. No complaints. Continue Entresto, Spironolactone, Coreg.  02/2018 Echo [facility] at [facility]: LVEF 25-30%, Dilated LV, RA, RV and LA, with severe diffuse hypokinesis and moderate-severe MR.   Repeat echo in 6 months.       Relevant Medications   carvedilol (COREG) 12.5 MG tablet   rosuvastatin (CRESTOR) 20 MG tablet   sacubitril-valsartan (ENTRESTO) 24-26 MG   spironolactone (ALDACTONE) 25 MG tablet   Other Relevant Orders   PCV ECHOCARDIOGRAM COMPLETE     Other   Tobacco abuse    Smokes 2 cigarettes daily, not wanting to quit      Mixed hyperlipidemia    Continue rosuvastatin 20 mg.      Relevant Medications   carvedilol (COREG) 12.5 MG tablet   rosuvastatin (CRESTOR) 20 MG tablet   sacubitril-valsartan (ENTRESTO) 24-26 MG   spironolactone (ALDACTONE) 25 MG tablet       Disposition:   Return in about 6 months (around 04/19/2023) for with echo week prior.Trevor Mace, MD  10/19/2022 9:35 AM    [facility]

## 2022-12-06 ENCOUNTER — Other Ambulatory Visit: Payer: Self-pay | Admitting: Cardiovascular Disease

## 2022-12-06 DIAGNOSIS — I5032 Chronic diastolic (congestive) heart failure: Secondary | ICD-10-CM

## 2023-01-22 ENCOUNTER — Other Ambulatory Visit: Payer: Self-pay | Admitting: Cardiovascular Disease

## 2023-01-22 DIAGNOSIS — I5032 Chronic diastolic (congestive) heart failure: Secondary | ICD-10-CM

## 2023-04-19 ENCOUNTER — Ambulatory Visit (INDEPENDENT_AMBULATORY_CARE_PROVIDER_SITE_OTHER): Payer: BC Managed Care – PPO

## 2023-04-19 DIAGNOSIS — I351 Nonrheumatic aortic (valve) insufficiency: Secondary | ICD-10-CM

## 2023-04-19 DIAGNOSIS — I361 Nonrheumatic tricuspid (valve) insufficiency: Secondary | ICD-10-CM

## 2023-04-19 DIAGNOSIS — I10 Essential (primary) hypertension: Secondary | ICD-10-CM

## 2023-04-19 DIAGNOSIS — I5032 Chronic diastolic (congestive) heart failure: Secondary | ICD-10-CM

## 2023-04-26 ENCOUNTER — Ambulatory Visit: Payer: BC Managed Care – PPO | Admitting: Cardiovascular Disease

## 2023-04-26 ENCOUNTER — Encounter: Payer: Self-pay | Admitting: Cardiovascular Disease

## 2023-04-26 VITALS — BP 100/70 | HR 69 | Ht 65.0 in | Wt 202.0 lb

## 2023-04-26 DIAGNOSIS — E782 Mixed hyperlipidemia: Secondary | ICD-10-CM | POA: Diagnosis not present

## 2023-04-26 DIAGNOSIS — I10 Essential (primary) hypertension: Secondary | ICD-10-CM | POA: Diagnosis not present

## 2023-04-26 DIAGNOSIS — F17209 Nicotine dependence, unspecified, with unspecified nicotine-induced disorders: Secondary | ICD-10-CM | POA: Diagnosis not present

## 2023-04-26 DIAGNOSIS — I5032 Chronic diastolic (congestive) heart failure: Secondary | ICD-10-CM | POA: Diagnosis not present

## 2023-04-26 MED ORDER — DAPAGLIFLOZIN PROPANEDIOL 10 MG PO TABS
10.0000 mg | ORAL_TABLET | Freq: Every day | ORAL | 3 refills | Status: AC
Start: 2023-04-26 — End: ?

## 2023-04-26 NOTE — Progress Notes (Signed)
Cardiology Office Note   Date:  04/26/2023   ID:  [place], [place] [birth year], MRN [id number]  PCP:  Leanna Sato, MD  Cardiologist:  Adrian Blackwater, MD      History of Present Illness: Rebecca Obrien is a 57 y.o. female who presents for  Chief Complaint  Patient presents with   Follow-up    6 mo f/u    Doing fine      Past Medical History:  Diagnosis Date   Anxiety    Arthritis    RA   Cancer ([facility])    skin melanoma   CHF (congestive heart failure) ([facility]) 02/09/2018   Collagen vascular disease ([facility])    RA   Complication of anesthesia    difficult intubation   Depression    Difficult airway    Difficult intubation    Very anterior airway. 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   History of kidney stones 1994   x 1   Hyperlipidemia    Hypertension    history of   Hyperthyroidism    Hypokalemia    Large intestine anastomotic leak 06/09/2017   Obesity    Rheumatoid arthritis(714.0)    Stroke ([facility]) 2013   no residual     Past Surgical History:  Procedure Laterality Date   ABDOMINAL HYSTERECTOMY     CARDIAC CATHETERIZATION     CARPAL TUNNEL RELEASE  Bilateral   CHOLECYSTECTOMY     CYSTOSCOPY WITH STENT PLACEMENT Bilateral 05/31/2017   Procedure: CYSTOSCOPY WITH STENT PLACEMENT- LIGHTED STENTS;  Surgeon: Vanna Scotland, MD;  Location: [facility];  Service: Urology;  Laterality: Bilateral;   EXCISION MELANOMA WITH SENTINEL LYMPH NODE BIOPSY Left 12/2016   Left calf with lymph nodes removed in left groin.   ILEOSTOMY N/A 06/08/2017   Procedure: ILEOSTOMY;  Surgeon: Ancil Linsey, MD;  Location: [facility];  Service: General;  Laterality: N/A;   ILEOSTOMY CLOSURE N/A 09/15/2017   Procedure: LOOP ILEOSTOMY TAKEDOWN;  Surgeon: Ricarda Frame, MD;  Location: [facility];  Service: General;  Laterality: N/A;   LAPAROSCOPIC SIGMOID COLECTOMY N/A 05/31/2017    Procedure: LAPAROSCOPIC SIGMOID COLECTOMY;  Surgeon: Ricarda Frame, MD;  Location: [facility];  Service: General;  Laterality: N/A;   LAPAROTOMY N/A 06/08/2017   Procedure: EXPLORATORY LAPAROTOMY;  Surgeon: Ancil Linsey, MD;  Location: [facility];  Service: General;  Laterality: N/A;   LEFT HEART CATH AND CORONARY ANGIOGRAPHY N/A 02/10/2018   Procedure: LEFT HEART CATH AND CORONARY ANGIOGRAPHY;  Surgeon: Laurier Nancy, MD;  Location: [facility];  Service: Cardiovascular;  Laterality: N/A;   TEE WITHOUT CARDIOVERSION  07/26/2012   Procedure: TRANSESOPHAGEAL ECHOCARDIOGRAM (TEE);  Surgeon: Vesta Mixer, MD;  Location: [facility] ENDOSCOPY;  Service: Cardiovascular;  Laterality: N/A;   TONSILLECTOMY     TOTAL THYROIDECTOMY       Current Outpatient Medications  Medication Sig Dispense Refill   dapagliflozin propanediol (FARXIGA) 10 MG TABS tablet Take 1 tablet (10 mg total) by mouth daily before breakfast. 30 tablet 3   aspirin EC 81 MG EC tablet Take 1 tablet (81 mg total) by mouth daily. 30 tablet 1   carvedilol (COREG) 12.5 MG tablet TAKE 1 TABLET(12.5 MG) BY MOUTH TWICE DAILY WITH A MEAL 180 tablet 1   ENTRESTO 24-26 MG TAKE 1 TABLET BY MOUTH TWICE DAILY 180 tablet 1  leflunomide (ARAVA) 20 MG tablet Take 20 mg by mouth daily.  1   levothyroxine (SYNTHROID, LEVOTHROID) 112 MCG tablet Take 112 mcg by mouth daily before breakfast.     rosuvastatin (CRESTOR) 20 MG tablet Take 1 tablet (20 mg total) by mouth at bedtime. 90 tablet 1   spironolactone (ALDACTONE) 25 MG tablet TAKE 1 TABLET BY MOUTH EVERY DAY 90 tablet 0   No current facility-administered medications for this visit.    Allergies:   Tape    Social History:   reports that she has been smoking cigarettes. She started smoking about 33 years ago. She has a 2.3 pack-year smoking history. She has never used smokeless tobacco. She reports that she does not drink alcohol and does not use drugs.   Family History:  family  history includes Cardiomyopathy in her brother; Heart failure in her father; Hypertension in her father and mother; Nephrolithiasis in her mother.    ROS:     Review of Systems  Constitutional: Negative.   HENT: Negative.    Eyes: Negative.   Respiratory: Negative.    Gastrointestinal: Negative.   Genitourinary: Negative.   Musculoskeletal: Negative.   Skin: Negative.   Neurological: Negative.   Endo/Heme/Allergies: Negative.   Psychiatric/Behavioral: Negative.    All other systems reviewed and are negative.     All other systems are reviewed and negative.    PHYSICAL EXAM: VS:  BP 100/70   Pulse 69   Ht 5\' 5"  (1.651 m)   Wt 202 lb (91.6 kg)   SpO2 96%   BMI 33.61 kg/m  , BMI Body mass index is 33.61 kg/m. Last weight:  Wt Readings from Last 3 Encounters:  04/26/23 202 lb (91.6 kg)  10/19/22 198 lb (89.8 kg)  07/26/17 140 lb (63.5 kg)     Physical Exam Constitutional:      Appearance: Normal appearance.  Cardiovascular:     Rate and Rhythm: Normal rate and regular rhythm.     Heart sounds: Normal heart sounds.  Pulmonary:     Effort: Pulmonary effort is normal.     Breath sounds: Normal breath sounds.  Musculoskeletal:     Right lower leg: No edema.     Left lower leg: No edema.  Neurological:     Mental Status: She is alert.       EKG:   Recent Labs: No results found for requested labs within last 365 days.    Lipid Panel    Component Value Date/Time   CHOL 190 07/24/2012 0435   TRIG 122 07/24/2012 0435   HDL 34 (L) 07/24/2012 0435   CHOLHDL 5.6 07/24/2012 0435   VLDL 24 07/24/2012 0435   LDLCALC 132 (H) 07/24/2012 0435      Other studies Reviewed: Additional studies/ records that were reviewed today include:  Review of the above records demonstrates:       No data to display            ASSESSMENT AND PLAN:    ICD-10-CM   1. Heart failure with improved ejection fraction (HFimpEF) (HCC)  I50.32 dapagliflozin propanediol  (FARXIGA) 10 MG TABS tablet   add farxiga, LVEF 54.1 and still on entresto    2. Primary hypertension  I10 dapagliflozin propanediol (FARXIGA) 10 MG TABS tablet    3. Mixed hyperlipidemia  E78.2 dapagliflozin propanediol (FARXIGA) 10 MG TABS tablet       Problem List Items Addressed This Visit       Cardiovascular and Mediastinum  Hypertension   Relevant Medications   dapagliflozin propanediol (FARXIGA) 10 MG TABS tablet   Heart failure with improved ejection fraction (HFimpEF) (HCC) - Primary   Relevant Medications   dapagliflozin propanediol (FARXIGA) 10 MG TABS tablet     Other   Mixed hyperlipidemia   Relevant Medications   dapagliflozin propanediol (FARXIGA) 10 MG TABS tablet       Disposition:   No follow-ups on file.    Total time spent: 30 minutes  Signed,  Adrian Blackwater, MD  04/26/2023 9:22 AM    [facility]

## 2023-05-26 ENCOUNTER — Other Ambulatory Visit: Payer: Self-pay | Admitting: Cardiovascular Disease

## 2023-05-26 DIAGNOSIS — I5032 Chronic diastolic (congestive) heart failure: Secondary | ICD-10-CM

## 2023-06-27 ENCOUNTER — Ambulatory Visit: Payer: BC Managed Care – PPO | Admitting: Cardiovascular Disease

## 2023-07-23 ENCOUNTER — Other Ambulatory Visit: Payer: Self-pay | Admitting: Cardiovascular Disease

## 2023-07-23 DIAGNOSIS — I5032 Chronic diastolic (congestive) heart failure: Secondary | ICD-10-CM

## 2023-08-25 ENCOUNTER — Other Ambulatory Visit: Payer: Self-pay | Admitting: Cardiovascular Disease

## 2023-08-25 DIAGNOSIS — I5032 Chronic diastolic (congestive) heart failure: Secondary | ICD-10-CM

## 2023-09-04 ENCOUNTER — Other Ambulatory Visit: Payer: Self-pay | Admitting: Cardiovascular Disease

## 2023-09-04 DIAGNOSIS — I5032 Chronic diastolic (congestive) heart failure: Secondary | ICD-10-CM

## 2023-11-28 ENCOUNTER — Other Ambulatory Visit: Payer: Self-pay | Admitting: Cardiovascular Disease

## 2023-11-28 DIAGNOSIS — I5032 Chronic diastolic (congestive) heart failure: Secondary | ICD-10-CM

## 2024-01-21 ENCOUNTER — Other Ambulatory Visit: Payer: Self-pay | Admitting: Cardiovascular Disease

## 2024-01-21 DIAGNOSIS — I5032 Chronic diastolic (congestive) heart failure: Secondary | ICD-10-CM

## 2024-02-24 ENCOUNTER — Other Ambulatory Visit: Payer: Self-pay | Admitting: Cardiovascular Disease

## 2024-02-24 DIAGNOSIS — I5032 Chronic diastolic (congestive) heart failure: Secondary | ICD-10-CM

## 2024-03-07 ENCOUNTER — Other Ambulatory Visit: Payer: Self-pay | Admitting: Cardiovascular Disease

## 2024-03-07 DIAGNOSIS — I5032 Chronic diastolic (congestive) heart failure: Secondary | ICD-10-CM

## 2024-04-23 ENCOUNTER — Other Ambulatory Visit: Payer: Self-pay

## 2024-04-23 DIAGNOSIS — I5032 Chronic diastolic (congestive) heart failure: Secondary | ICD-10-CM

## 2024-04-23 MED ORDER — SACUBITRIL-VALSARTAN 24-26 MG PO TABS
1.0000 | ORAL_TABLET | Freq: Two times a day (BID) | ORAL | 1 refills | Status: DC
Start: 2024-04-23 — End: 2024-07-20

## 2024-06-04 ENCOUNTER — Other Ambulatory Visit: Payer: Self-pay | Admitting: Cardiovascular Disease

## 2024-06-04 DIAGNOSIS — I5032 Chronic diastolic (congestive) heart failure: Secondary | ICD-10-CM

## 2024-07-20 ENCOUNTER — Other Ambulatory Visit: Payer: Self-pay | Admitting: Cardiovascular Disease

## 2024-07-20 DIAGNOSIS — I5020 Unspecified systolic (congestive) heart failure: Secondary | ICD-10-CM

## 2024-08-09 ENCOUNTER — Encounter: Payer: Self-pay | Admitting: Cardiovascular Disease

## 2024-08-09 ENCOUNTER — Ambulatory Visit: Admitting: Cardiovascular Disease

## 2024-08-09 VITALS — BP 122/79 | HR 65 | Ht 64.0 in | Wt 194.8 lb

## 2024-08-09 DIAGNOSIS — R0602 Shortness of breath: Secondary | ICD-10-CM

## 2024-08-09 DIAGNOSIS — I25118 Atherosclerotic heart disease of native coronary artery with other forms of angina pectoris: Secondary | ICD-10-CM

## 2024-08-09 DIAGNOSIS — Z720 Tobacco use: Secondary | ICD-10-CM

## 2024-08-09 DIAGNOSIS — F17210 Nicotine dependence, cigarettes, uncomplicated: Secondary | ICD-10-CM

## 2024-08-09 DIAGNOSIS — I10 Essential (primary) hypertension: Secondary | ICD-10-CM | POA: Diagnosis not present

## 2024-08-09 DIAGNOSIS — I5020 Unspecified systolic (congestive) heart failure: Secondary | ICD-10-CM | POA: Diagnosis not present

## 2024-08-09 DIAGNOSIS — E782 Mixed hyperlipidemia: Secondary | ICD-10-CM | POA: Diagnosis not present

## 2024-08-09 NOTE — Progress Notes (Signed)
 Cardiology Office Note   Date:  08/09/2024   ID:  Rebecca Obrien, DOB 18-Jun-1966, MRN [id number]  PCP:  [place] HERO, MD  Cardiologist:  Denyse Bathe, MD      History of Present Illness: Rebecca Obrien is a 58 y.o. female who presents for  Chief Complaint  Patient presents with   Follow-up    Follow up for med refills     No chest pain orr SOB.      Past Medical History:  Diagnosis Date   Anxiety    Arthritis    RA   Cancer (HCC)    skin melanoma   CHF (congestive heart failure) (HCC) 02/09/2018   Collagen vascular disease    RA   Complication of anesthesia    difficult intubation   Depression    Difficult airway    Difficult intubation    Very anterior airway. 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   History of kidney stones 1994   x 1   Hyperlipidemia    Hypertension    history of   Hyperthyroidism    Hypokalemia    Large intestine anastomotic leak 06/09/2017   Obesity    Rheumatoid arthritis(714.0)    Stroke (HCC) 2013   no residual     Past Surgical History:  Procedure Laterality Date   ABDOMINAL HYSTERECTOMY     CARDIAC CATHETERIZATION     CARPAL TUNNEL RELEASE  Bilateral   CHOLECYSTECTOMY     CYSTOSCOPY WITH STENT PLACEMENT Bilateral 05/31/2017   Procedure: CYSTOSCOPY WITH STENT PLACEMENT- LIGHTED STENTS;  Surgeon: Penne Knee, MD;  Location: [facility];  Service: Urology;  Laterality: Bilateral;   EXCISION MELANOMA WITH SENTINEL LYMPH NODE BIOPSY Left 12/2016   Left calf with lymph nodes removed in left groin.   ILEOSTOMY N/A 06/08/2017   Procedure: ILEOSTOMY;  Surgeon: Nicholaus Selinda Birmingham, MD;  Location: [facility];  Service: General;  Laterality: N/A;   ILEOSTOMY CLOSURE N/A 09/15/2017   Procedure: LOOP ILEOSTOMY TAKEDOWN;  Surgeon: Shelva Dunnings, MD;  Location: [facility];  Service: General;  Laterality: N/A;   LAPAROSCOPIC SIGMOID  COLECTOMY N/A 05/31/2017   Procedure: LAPAROSCOPIC SIGMOID COLECTOMY;  Surgeon: Shelva Dunnings, MD;  Location: [facility];  Service: General;  Laterality: N/A;   LAPAROTOMY N/A 06/08/2017   Procedure: EXPLORATORY LAPAROTOMY;  Surgeon: Nicholaus Selinda Birmingham, MD;  Location: [facility];  Service: General;  Laterality: N/A;   LEFT HEART CATH AND CORONARY ANGIOGRAPHY N/A 02/10/2018   Procedure: LEFT HEART CATH AND CORONARY ANGIOGRAPHY;  Surgeon: Bathe Denyse LABOR, MD;  Location: [facility];  Service: Cardiovascular;  Laterality: N/A;   TEE WITHOUT CARDIOVERSION  07/26/2012   Procedure: TRANSESOPHAGEAL ECHOCARDIOGRAM (TEE);  Surgeon: Aleene JINNY Passe, MD;  Location: [facility];  Service: Cardiovascular;  Laterality: N/A;   TONSILLECTOMY     TOTAL THYROIDECTOMY       Current Outpatient Medications  Medication Sig Dispense Refill   aspirin  EC 81 MG EC tablet Take 1 tablet (81 mg total) by mouth daily. 30 tablet 1   carvedilol  (COREG ) 12.5 MG tablet TAKE 1 TABLET(12.5 MG) BY MOUTH TWICE DAILY WITH A MEAL 180 tablet 1   dapagliflozin  propanediol (FARXIGA ) 10 MG TABS tablet Take 1 tablet (10 mg total) by mouth daily before breakfast. 30 tablet 3   leflunomide  (ARAVA ) 20 MG tablet Take 20 mg by  mouth daily.  1   levothyroxine  (SYNTHROID , LEVOTHROID) 112 MCG tablet Take 112 mcg by mouth daily before breakfast.     rosuvastatin  (CRESTOR ) 20 MG tablet TAKE 1 TABLET(20 MG) BY MOUTH AT BEDTIME 90 tablet 1   sacubitril -valsartan  (ENTRESTO ) 24-26 MG TAKE 1 TABLET BY MOUTH TWICE DAILY 180 tablet 1   spironolactone  (ALDACTONE ) 25 MG tablet TAKE 1 TABLET BY MOUTH EVERY DAY 90 tablet 0   No current facility-administered medications for this visit.    Allergies:   Tape    Social History:   reports that she has been smoking cigarettes. She started smoking about 35 years ago. She has a 2.3 pack-year smoking history. She has never used smokeless tobacco. She reports that she does not drink alcohol and does not use  drugs.   Family History:  family history includes Cardiomyopathy in her brother; Heart failure in her father; Hypertension in her father and mother; Nephrolithiasis in her mother.    ROS:     Review of Systems  Constitutional: Negative.   HENT: Negative.    Eyes: Negative.   Respiratory: Negative.    Gastrointestinal: Negative.   Genitourinary: Negative.   Musculoskeletal: Negative.   Skin: Negative.   Neurological: Negative.   Endo/Heme/Allergies: Negative.   Psychiatric/Behavioral: Negative.    All other systems reviewed and are negative.     All other systems are reviewed and negative.    PHYSICAL EXAM: VS:  BP 122/79   Pulse 65   Ht 5' 4 (1.626 m)   Wt 194 lb 12.8 oz (88.4 kg)   SpO2 95%   BMI 33.44 kg/m  , BMI Body mass index is 33.44 kg/m. Last weight:  Wt Readings from Last 3 Encounters:  08/09/24 194 lb 12.8 oz (88.4 kg)  04/26/23 202 lb (91.6 kg)  10/19/22 198 lb (89.8 kg)     Physical Exam Constitutional:      Appearance: Normal appearance.  Cardiovascular:     Rate and Rhythm: Normal rate and regular rhythm.     Heart sounds: Normal heart sounds.  Pulmonary:     Effort: Pulmonary effort is normal.     Breath sounds: Normal breath sounds.  Musculoskeletal:     Right lower leg: No edema.     Left lower leg: No edema.  Neurological:     Mental Status: She is alert.       EKG:   Recent Labs: No results found for requested labs within last 365 days.    Lipid Panel    Component Value Date/Time   CHOL 190 07/24/2012 0435   TRIG 122 07/24/2012 0435   HDL 34 (L) 07/24/2012 0435   CHOLHDL 5.6 07/24/2012 0435   VLDL 24 07/24/2012 0435   LDLCALC 132 (H) 07/24/2012 0435      Other studies Reviewed: Additional studies/ records that were reviewed today include:  Review of the above records demonstrates:       No data to display            ASSESSMENT AND PLAN:    ICD-10-CM   1. Coronary artery disease of native artery of native  heart with stable angina pectoris  I25.118 PCV ECHOCARDIOGRAM COMPLETE    MYOCARDIAL PERFUSION IMAGING   Cardiac cath 2016 had moderate CAD , advise f/u eho and stress test.    2. Heart failure with improved ejection fraction (HFimpEF) (HCC)  I50.20 PCV ECHOCARDIOGRAM COMPLETE    MYOCARDIAL PERFUSION IMAGING   Recheck, labs and ECHO as  has been on GDMT. labs CREAT, k WERE NORMAL    3. Primary hypertension  I10 PCV ECHOCARDIOGRAM COMPLETE    MYOCARDIAL PERFUSION IMAGING    4. Mixed hyperlipidemia  E78.2 PCV ECHOCARDIOGRAM COMPLETE    MYOCARDIAL PERFUSION IMAGING    5. Tobacco abuse  Z72.0 PCV ECHOCARDIOGRAM COMPLETE    MYOCARDIAL PERFUSION IMAGING    6. SOB (shortness of breath)  R06.02 PCV ECHOCARDIOGRAM COMPLETE    MYOCARDIAL PERFUSION IMAGING       Problem List Items Addressed This Visit       Cardiovascular and Mediastinum   Hypertension   Relevant Orders   PCV ECHOCARDIOGRAM COMPLETE   MYOCARDIAL PERFUSION IMAGING   Heart failure with improved ejection fraction (HFimpEF) (HCC)   Relevant Orders   PCV ECHOCARDIOGRAM COMPLETE   MYOCARDIAL PERFUSION IMAGING     Other   Tobacco abuse   Relevant Orders   PCV ECHOCARDIOGRAM COMPLETE   MYOCARDIAL PERFUSION IMAGING   Mixed hyperlipidemia   Relevant Orders   PCV ECHOCARDIOGRAM COMPLETE   MYOCARDIAL PERFUSION IMAGING   Other Visit Diagnoses       Coronary artery disease of native artery of native heart with stable angina pectoris    -  Primary   Cardiac cath 2016 had moderate CAD , advise f/u eho and stress test.   Relevant Orders   PCV ECHOCARDIOGRAM COMPLETE   MYOCARDIAL PERFUSION IMAGING     SOB (shortness of breath)       Relevant Orders   PCV ECHOCARDIOGRAM COMPLETE   MYOCARDIAL PERFUSION IMAGING          Disposition:   Return in about 5 weeks (around 09/13/2024) for echo and stress test AND F/U.    Total time spent: 40 minutes  Signed,  Denyse Bathe, MD  08/09/2024 10:05 AM    [facility]

## 2024-08-29 ENCOUNTER — Other Ambulatory Visit: Payer: Self-pay | Admitting: Cardiovascular Disease

## 2024-08-29 DIAGNOSIS — I5020 Unspecified systolic (congestive) heart failure: Secondary | ICD-10-CM

## 2024-08-31 ENCOUNTER — Other Ambulatory Visit: Payer: Self-pay | Admitting: Cardiovascular Disease

## 2024-08-31 DIAGNOSIS — I5020 Unspecified systolic (congestive) heart failure: Secondary | ICD-10-CM

## 2024-09-13 ENCOUNTER — Encounter

## 2024-09-14 ENCOUNTER — Other Ambulatory Visit

## 2024-09-18 ENCOUNTER — Ambulatory Visit: Admitting: Cardiovascular Disease

## 2024-10-12 ENCOUNTER — Ambulatory Visit

## 2024-10-12 DIAGNOSIS — I5020 Unspecified systolic (congestive) heart failure: Secondary | ICD-10-CM

## 2024-10-12 DIAGNOSIS — Z720 Tobacco use: Secondary | ICD-10-CM

## 2024-10-12 DIAGNOSIS — I25118 Atherosclerotic heart disease of native coronary artery with other forms of angina pectoris: Secondary | ICD-10-CM

## 2024-10-12 DIAGNOSIS — I10 Essential (primary) hypertension: Secondary | ICD-10-CM

## 2024-10-12 DIAGNOSIS — R0602 Shortness of breath: Secondary | ICD-10-CM

## 2024-10-12 DIAGNOSIS — E782 Mixed hyperlipidemia: Secondary | ICD-10-CM

## 2024-10-18 ENCOUNTER — Ambulatory Visit: Admitting: Cardiovascular Disease
